# Patient Record
Sex: Female | Born: 1965 | Race: White | Hispanic: No | Marital: Married | Smoking: Never smoker
Health system: Southern US, Community
[De-identification: ages and names within clinical notes are randomized; demographics above are authoritative.]

## PROBLEM LIST (undated history)

## (undated) DIAGNOSIS — K859 Acute pancreatitis without necrosis or infection, unspecified: Secondary | ICD-10-CM

## (undated) DIAGNOSIS — D649 Anemia, unspecified: Secondary | ICD-10-CM

## (undated) DIAGNOSIS — E119 Type 2 diabetes mellitus without complications: Secondary | ICD-10-CM

## (undated) DIAGNOSIS — M51369 Other intervertebral disc degeneration, lumbar region without mention of lumbar back pain or lower extremity pain: Secondary | ICD-10-CM

## (undated) DIAGNOSIS — E039 Hypothyroidism, unspecified: Secondary | ICD-10-CM

## (undated) DIAGNOSIS — E785 Hyperlipidemia, unspecified: Secondary | ICD-10-CM

## (undated) DIAGNOSIS — I10 Essential (primary) hypertension: Secondary | ICD-10-CM

## (undated) DIAGNOSIS — F329 Major depressive disorder, single episode, unspecified: Secondary | ICD-10-CM

## (undated) DIAGNOSIS — F419 Anxiety disorder, unspecified: Secondary | ICD-10-CM

## (undated) DIAGNOSIS — M5136 Other intervertebral disc degeneration, lumbar region: Secondary | ICD-10-CM

## (undated) DIAGNOSIS — F32A Depression, unspecified: Secondary | ICD-10-CM

## (undated) HISTORY — PX: ABDOMINAL HYSTERECTOMY: SHX81

## (undated) HISTORY — PX: CYSTECTOMY: SUR359

---
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
## 2004-05-25 ENCOUNTER — Inpatient Hospital Stay: Payer: Self-pay | Admitting: Obstetrics and Gynecology

## 2007-12-12 ENCOUNTER — Ambulatory Visit: Payer: Self-pay | Admitting: Obstetrics and Gynecology

## 2007-12-26 ENCOUNTER — Ambulatory Visit: Payer: Self-pay | Admitting: Obstetrics and Gynecology

## 2008-06-21 ENCOUNTER — Ambulatory Visit: Payer: Self-pay | Admitting: Obstetrics and Gynecology

## 2009-06-18 ENCOUNTER — Ambulatory Visit: Payer: Self-pay | Admitting: Obstetrics and Gynecology

## 2009-07-25 ENCOUNTER — Ambulatory Visit: Payer: Self-pay | Admitting: Internal Medicine

## 2009-07-25 ENCOUNTER — Emergency Department: Payer: Self-pay | Admitting: Internal Medicine

## 2009-07-28 ENCOUNTER — Ambulatory Visit: Payer: Self-pay | Admitting: Gastroenterology

## 2009-08-26 ENCOUNTER — Ambulatory Visit: Payer: Self-pay | Admitting: Gastroenterology

## 2011-04-28 ENCOUNTER — Ambulatory Visit: Payer: Self-pay | Admitting: Internal Medicine

## 2011-05-13 ENCOUNTER — Ambulatory Visit: Payer: Self-pay | Admitting: Internal Medicine

## 2011-12-17 ENCOUNTER — Ambulatory Visit: Payer: Self-pay | Admitting: Internal Medicine

## 2011-12-27 ENCOUNTER — Ambulatory Visit: Payer: Self-pay | Admitting: Internal Medicine

## 2012-01-17 ENCOUNTER — Ambulatory Visit: Payer: Self-pay | Admitting: Anesthesiology

## 2012-01-17 LAB — BASIC METABOLIC PANEL
Calcium, Total: 9.5 mg/dL (ref 8.5–10.1)
Chloride: 102 mmol/L (ref 98–107)
Co2: 29 mmol/L (ref 21–32)
Creatinine: 0.63 mg/dL (ref 0.60–1.30)
EGFR (African American): >60
Sodium: 139 mmol/L (ref 136–145)

## 2012-01-21 ENCOUNTER — Other Ambulatory Visit: Payer: Self-pay | Admitting: General Surgery

## 2012-01-21 ENCOUNTER — Ambulatory Visit: Payer: Self-pay | Admitting: Surgery

## 2012-01-21 DIAGNOSIS — R922 Inconclusive mammogram: Secondary | ICD-10-CM

## 2012-01-26 ENCOUNTER — Ambulatory Visit: Payer: Self-pay | Admitting: Surgery

## 2012-02-03 ENCOUNTER — Ambulatory Visit
Admission: RE | Admit: 2012-02-03 | Discharge: 2012-02-03 | Disposition: A | Discharge status: Home / Self Care | Payer: BC Managed Care – PPO | Source: Ambulatory Visit | Attending: General Surgery | Admitting: General Surgery

## 2012-02-03 DIAGNOSIS — R922 Inconclusive mammogram: Secondary | ICD-10-CM

## 2012-02-03 MED ORDER — GADOBENATE DIMEGLUMINE 529 MG/ML IV SOLN
15.0000 mL | Freq: Once | INTRAVENOUS | Status: AC | PRN
  Administered 2012-02-03: 15 mL via INTRAVENOUS

## 2012-03-01 ENCOUNTER — Ambulatory Visit: Payer: Self-pay | Admitting: Internal Medicine

## 2014-02-07 ENCOUNTER — Ambulatory Visit: Payer: Self-pay | Admitting: Internal Medicine

## 2014-08-20 NOTE — Op Note (Signed)
PATIENT NAME:  Marissa Cook, Marissa Cook MR#:  [id number] DATE OF BIRTH:  [birth year]  DATE OF PROCEDURE:  01/21/2012  PREOPERATIVE DIAGNOSIS: Internal and external hemorrhoids.   POSTOPERATIVE DIAGNOSIS:  Internal and external hemorrhoids.  PROCEDURE: Internal and external hemorrhoidectomy.   SURGEON:  Rochel Brome, M.D.   ANESTHESIA: General.   INDICATIONS: This 49 year old female has a history of hemorrhoids for some 11 years with increasing bright red bleeding and associated pain. She had findings of large internal and external hemorrhoids found on exam and surgery was recommended for definitive treatment.   DESCRIPTION OF PROCEDURE: The patient was placed on the operating table in the supine position under general anesthesia. The legs were elevated into the lithotomy position. The anal area was prepared with Betadine solution and draped with sterile towels and sheets.   Initial inspection revealed there were large hemorrhoids found at the 4 o'clock, 8 o'clock, and 11 o'clock positions. The anoderm was infiltrated with 0.5% Sensorcaine with epinephrine. The anal canal was dilated large enough to admit three fingers. The bivalve anal retractor was introduced. The internal hemorrhoids were identified at the same locations and then there were other hemorrhoids dispersed between those sites. There was no neoplasm seen.  The hemorrhoid at the 11 o'clock position was removed first. A high ligation of the internal component was done with 2-0 chromic suture ligature. A V-shaped incision was made externally with a scalpel and the dissection was begun with scissors dissecting the external hemorrhoid away from the surrounding subcutaneous tissues. Several small bleeding points were cauterized. Next, dissection was carried up over the internal sphincter up to the previously placed suture ligature. The hemorrhoid was again ligated with the same suture ligature. The wound was inspected. Several small bleeding points  were cauterized. Next, the wound was closed with a running locked 2-0 chromic suture leaving a small opening externally for drainage.   Next, this identical procedure was carried out at the 4 o'clock position and again at the 8 o'clock position. Each of the hemorrhoids was submitted in formalin for routine pathology. After the procedure the anoderm was again cleaned with Betadine and the operative area was infiltrated with 20 mL of  Exparel. Dressings were applied with paper tape. The patient tolerated surgery satisfactorily and was then prepared for transfer to the recovery room.     ____________________________ Lenna Sciara. Rochel Brome, MD [id number] D: 01/21/2012 13:58:03 ET T: 01/21/2012 14:56:59 ET JOB#: [id number]  cc: Loreli Dollar, MD, <Dictator> Loreli Dollar MD ELECTRONICALLY SIGNED 01/28/2012 9:45

## 2015-04-14 ENCOUNTER — Other Ambulatory Visit: Payer: Self-pay | Admitting: Internal Medicine

## 2015-04-14 DIAGNOSIS — R1084 Generalized abdominal pain: Secondary | ICD-10-CM

## 2015-04-14 DIAGNOSIS — R102 Pelvic and perineal pain: Secondary | ICD-10-CM

## 2015-04-21 ENCOUNTER — Ambulatory Visit
Admission: RE | Admit: 2015-04-21 | Discharge: 2015-04-21 | Disposition: A | Discharge status: Home / Self Care | Payer: BC Managed Care – PPO | Source: Ambulatory Visit | Attending: Internal Medicine | Admitting: Internal Medicine

## 2015-04-21 DIAGNOSIS — R102 Pelvic and perineal pain: Secondary | ICD-10-CM

## 2015-04-21 DIAGNOSIS — K760 Fatty (change of) liver, not elsewhere classified: Secondary | ICD-10-CM | POA: Insufficient documentation

## 2015-04-21 DIAGNOSIS — R1084 Generalized abdominal pain: Secondary | ICD-10-CM

## 2015-04-21 DIAGNOSIS — N83202 Unspecified ovarian cyst, left side: Secondary | ICD-10-CM | POA: Diagnosis not present

## 2015-04-21 HISTORY — DX: Type 2 diabetes mellitus without complications: E11.9

## 2015-04-21 MED ORDER — IOHEXOL 300 MG/ML  SOLN
100.0000 mL | Freq: Once | INTRAMUSCULAR | Status: AC | PRN
  Administered 2015-04-21: 100 mL via INTRAVENOUS

## 2015-10-02 ENCOUNTER — Other Ambulatory Visit: Payer: Self-pay | Admitting: Internal Medicine

## 2015-10-02 DIAGNOSIS — R1013 Epigastric pain: Secondary | ICD-10-CM

## 2015-10-08 ENCOUNTER — Ambulatory Visit
Admission: RE | Admit: 2015-10-08 | Discharge: 2015-10-08 | Disposition: A | Discharge status: Home / Self Care | Payer: BC Managed Care – PPO | Source: Ambulatory Visit | Attending: Internal Medicine | Admitting: Internal Medicine

## 2015-10-08 DIAGNOSIS — R1013 Epigastric pain: Secondary | ICD-10-CM | POA: Insufficient documentation

## 2015-10-08 DIAGNOSIS — R932 Abnormal findings on diagnostic imaging of liver and biliary tract: Secondary | ICD-10-CM | POA: Diagnosis not present

## 2015-10-08 DIAGNOSIS — N200 Calculus of kidney: Secondary | ICD-10-CM | POA: Diagnosis not present

## 2015-10-23 ENCOUNTER — Other Ambulatory Visit: Payer: Self-pay | Admitting: Gastroenterology

## 2015-10-23 DIAGNOSIS — R110 Nausea: Secondary | ICD-10-CM

## 2015-10-23 DIAGNOSIS — R1013 Epigastric pain: Secondary | ICD-10-CM

## 2015-11-07 ENCOUNTER — Encounter
Admission: RE | Admit: 2015-11-07 | Discharge: 2015-11-07 | Disposition: A | Discharge status: Home / Self Care | Payer: BC Managed Care – PPO | Source: Ambulatory Visit | Attending: Gastroenterology | Admitting: Gastroenterology

## 2015-11-07 DIAGNOSIS — R1013 Epigastric pain: Secondary | ICD-10-CM | POA: Insufficient documentation

## 2015-11-07 DIAGNOSIS — R110 Nausea: Secondary | ICD-10-CM | POA: Insufficient documentation

## 2015-11-07 MED ORDER — TECHNETIUM TC 99M MEBROFENIN IV KIT
5.0000 | PACK | Freq: Once | INTRAVENOUS | Status: AC | PRN
  Administered 2015-11-07: 5.22 via INTRAVENOUS

## 2015-12-24 ENCOUNTER — Other Ambulatory Visit
Admission: RE | Admit: 2015-12-24 | Discharge: 2015-12-24 | Disposition: A | Discharge status: Home / Self Care | Payer: BC Managed Care – PPO | Source: Other Acute Inpatient Hospital | Attending: Gastroenterology | Admitting: Gastroenterology

## 2015-12-24 DIAGNOSIS — R197 Diarrhea, unspecified: Secondary | ICD-10-CM | POA: Insufficient documentation

## 2015-12-24 LAB — GASTROINTESTINAL PANEL BY PCR, STOOL (REPLACES STOOL CULTURE)
ADENOVIRUS F40/41: NOT DETECTED
ASTROVIRUS: NOT DETECTED
CRYPTOSPORIDIUM: NOT DETECTED
CYCLOSPORA CAYETANENSIS: NOT DETECTED
Campylobacter species: NOT DETECTED
E. coli O157: NOT DETECTED
ENTAMOEBA HISTOLYTICA: NOT DETECTED
ENTEROPATHOGENIC E COLI (EPEC): NOT DETECTED
Enteroaggregative E coli (EAEC): NOT DETECTED
Enterotoxigenic E coli (ETEC): NOT DETECTED
GIARDIA LAMBLIA: NOT DETECTED
Norovirus GI/GII: NOT DETECTED
Plesimonas shigelloides: NOT DETECTED
ROTAVIRUS A: NOT DETECTED
SHIGA LIKE TOXIN PRODUCING E COLI (STEC): NOT DETECTED
Salmonella species: NOT DETECTED
Sapovirus (I, II, IV, and V): NOT DETECTED
Shigella/Enteroinvasive E coli (EIEC): NOT DETECTED
VIBRIO CHOLERAE: NOT DETECTED
VIBRIO SPECIES: NOT DETECTED
YERSINIA ENTEROCOLITICA: NOT DETECTED

## 2015-12-24 LAB — C DIFFICILE QUICK SCREEN W PCR REFLEX
C DIFFICILE (CDIFF) INTERP: NOT DETECTED
C Diff antigen: NEGATIVE
C Diff toxin: NEGATIVE

## 2015-12-26 LAB — H. PYLORI ANTIGEN, STOOL: H. PYLORI STOOL AG, EIA: NEGATIVE

## 2016-01-15 ENCOUNTER — Ambulatory Visit: Payer: BC Managed Care – PPO | Admitting: Anesthesiology

## 2016-01-15 ENCOUNTER — Encounter
Admission: RE | Disposition: A | Discharge status: Home / Self Care | Payer: Self-pay | Source: Ambulatory Visit | Attending: Unknown Physician Specialty

## 2016-01-15 ENCOUNTER — Ambulatory Visit
Admission: RE | Admit: 2016-01-15 | Discharge: 2016-01-15 | Disposition: A | Discharge status: Home / Self Care | Payer: BC Managed Care – PPO | Source: Ambulatory Visit | Attending: Unknown Physician Specialty | Admitting: Unknown Physician Specialty

## 2016-01-15 ENCOUNTER — Encounter: Payer: Self-pay | Admitting: *Deleted

## 2016-01-15 DIAGNOSIS — K317 Polyp of stomach and duodenum: Secondary | ICD-10-CM | POA: Diagnosis not present

## 2016-01-15 DIAGNOSIS — E039 Hypothyroidism, unspecified: Secondary | ICD-10-CM | POA: Diagnosis not present

## 2016-01-15 DIAGNOSIS — K6389 Other specified diseases of intestine: Secondary | ICD-10-CM | POA: Insufficient documentation

## 2016-01-15 DIAGNOSIS — K529 Noninfective gastroenteritis and colitis, unspecified: Secondary | ICD-10-CM | POA: Insufficient documentation

## 2016-01-15 DIAGNOSIS — K5730 Diverticulosis of large intestine without perforation or abscess without bleeding: Secondary | ICD-10-CM | POA: Diagnosis not present

## 2016-01-15 DIAGNOSIS — Z7982 Long term (current) use of aspirin: Secondary | ICD-10-CM | POA: Insufficient documentation

## 2016-01-15 DIAGNOSIS — I10 Essential (primary) hypertension: Secondary | ICD-10-CM | POA: Diagnosis not present

## 2016-01-15 DIAGNOSIS — K2970 Gastritis, unspecified, without bleeding: Secondary | ICD-10-CM | POA: Diagnosis not present

## 2016-01-15 DIAGNOSIS — Z794 Long term (current) use of insulin: Secondary | ICD-10-CM | POA: Diagnosis not present

## 2016-01-15 DIAGNOSIS — D120 Benign neoplasm of cecum: Secondary | ICD-10-CM | POA: Diagnosis not present

## 2016-01-15 DIAGNOSIS — M5136 Other intervertebral disc degeneration, lumbar region: Secondary | ICD-10-CM | POA: Diagnosis not present

## 2016-01-15 DIAGNOSIS — Z90710 Acquired absence of both cervix and uterus: Secondary | ICD-10-CM | POA: Diagnosis not present

## 2016-01-15 DIAGNOSIS — K640 First degree hemorrhoids: Secondary | ICD-10-CM | POA: Insufficient documentation

## 2016-01-15 DIAGNOSIS — Z79899 Other long term (current) drug therapy: Secondary | ICD-10-CM | POA: Insufficient documentation

## 2016-01-15 DIAGNOSIS — E119 Type 2 diabetes mellitus without complications: Secondary | ICD-10-CM | POA: Diagnosis not present

## 2016-01-15 DIAGNOSIS — E785 Hyperlipidemia, unspecified: Secondary | ICD-10-CM | POA: Insufficient documentation

## 2016-01-15 DIAGNOSIS — D122 Benign neoplasm of ascending colon: Secondary | ICD-10-CM | POA: Diagnosis not present

## 2016-01-15 DIAGNOSIS — Z8719 Personal history of other diseases of the digestive system: Secondary | ICD-10-CM | POA: Insufficient documentation

## 2016-01-15 HISTORY — DX: Acute pancreatitis without necrosis or infection, unspecified: K85.90

## 2016-01-15 HISTORY — DX: Hyperlipidemia, unspecified: E78.5

## 2016-01-15 HISTORY — PX: ESOPHAGOGASTRODUODENOSCOPY (EGD) WITH PROPOFOL: SHX5813

## 2016-01-15 HISTORY — DX: Anemia, unspecified: D64.9

## 2016-01-15 HISTORY — DX: Essential (primary) hypertension: I10

## 2016-01-15 HISTORY — DX: Other intervertebral disc degeneration, lumbar region without mention of lumbar back pain or lower extremity pain: M51.369

## 2016-01-15 HISTORY — DX: Hypothyroidism, unspecified: E03.9

## 2016-01-15 HISTORY — PX: COLONOSCOPY: SHX5424

## 2016-01-15 HISTORY — DX: Other intervertebral disc degeneration, lumbar region: M51.36

## 2016-01-15 LAB — GLUCOSE, CAPILLARY
GLUCOSE-CAPILLARY: 249 mg/dL — AB (ref 65–99)
Glucose-Capillary: 293 mg/dL — ABNORMAL HIGH (ref 65–99)

## 2016-01-15 SURGERY — COLONOSCOPY
Anesthesia: General

## 2016-01-15 MED ORDER — FENTANYL CITRATE (PF) 100 MCG/2ML IJ SOLN
INTRAMUSCULAR | Status: DC | PRN
  Administered 2016-01-15: 50 ug via INTRAVENOUS

## 2016-01-15 MED ORDER — PROPOFOL 500 MG/50ML IV EMUL
INTRAVENOUS | Status: DC | PRN
  Administered 2016-01-15: 120 ug/kg/min via INTRAVENOUS

## 2016-01-15 MED ORDER — SODIUM CHLORIDE 0.9 % IV SOLN: INTRAVENOUS | Status: DC

## 2016-01-15 MED ORDER — INSULIN ASPART 100 UNIT/ML ~~LOC~~ SOLN
10.0000 [IU] | Freq: Once | SUBCUTANEOUS | Status: AC
  Administered 2016-01-15: 10 [IU] via SUBCUTANEOUS
  Filled 2016-01-15: qty 0.1

## 2016-01-15 MED ORDER — SODIUM CHLORIDE 0.9 % IV SOLN
INTRAVENOUS | Status: DC
  Administered 2016-01-15: 07:00:00 via INTRAVENOUS

## 2016-01-15 MED ORDER — LIDOCAINE HCL (CARDIAC) 20 MG/ML IV SOLN
INTRAVENOUS | Status: DC | PRN
  Administered 2016-01-15: 50 mg via INTRAVENOUS

## 2016-01-15 MED ORDER — MIDAZOLAM HCL 2 MG/2ML IJ SOLN
INTRAMUSCULAR | Status: DC | PRN
  Administered 2016-01-15: 1 mg via INTRAVENOUS

## 2016-01-15 NOTE — Anesthesia Procedure Notes (Signed)
Performed by: COOK-MARTIN, Carneshia Raker Pre-anesthesia Checklist: Patient identified, Emergency Drugs available, Suction available, Patient being monitored and Timeout performed Patient Re-evaluated:Patient Re-evaluated prior to inductionOxygen Delivery Method: Nasal cannula Preoxygenation: Pre-oxygenation with 100% oxygen Intubation Type: IV induction Airway Equipment and Method: Bite block Placement Confirmation: positive ETCO2 and CO2 detector     

## 2016-01-15 NOTE — H&P (Signed)
Primary Care Physician:  Idelle Crouch, MD Primary Gastroenterologist:  Dr. Vira Agar  Pre-Procedure History & Physical: HPI:  Marissa Cook is a 50 y.o. female is here for an endoscopy and colonoscopy.   Past Medical History:  Diagnosis Date  . Anemia   . DDD (degenerative disc disease), lumbar   . Diabetes mellitus without complication ([place])   . Hyperlipidemia   . Hypertension   . Hypothyroidism   . Pancreatitis     Past Surgical History:  Procedure Laterality Date  . ABDOMINAL HYSTERECTOMY    . CYSTECTOMY      Prior to Admission medications   Medication Sig Start Date End Date Taking? Authorizing Provider  aspirin EC 81 MG tablet Take 81 mg by mouth daily.   Yes Historical Provider, MD  atorvastatin (LIPITOR) 80 MG tablet Take 80 mg by mouth daily.   Yes Historical Provider, MD  dapagliflozin propanediol (FARXIGA) 10 MG TABS tablet Take 10 mg by mouth daily.   Yes Historical Provider, MD  escitalopram (LEXAPRO) 10 MG tablet Take 10 mg by mouth daily.   Yes Historical Provider, MD  gabapentin (NEURONTIN) 300 MG capsule Take 300 mg by mouth 3 (three) times daily.   Yes Historical Provider, MD  hyoscyamine (LEVSIN, ANASPAZ) 0.125 MG tablet Take 0.125 mg by mouth every 6 (six) hours as needed.   Yes Historical Provider, MD  insulin aspart (NOVOLOG) 100 UNIT/ML injection Inject 100 Units into the skin 3 (three) times daily before meals. Use up to 100 units daily in insulin pump   Yes Historical Provider, MD  levothyroxine (SYNTHROID, LEVOTHROID) 150 MCG tablet Take 150 mcg by mouth daily before breakfast.   Yes Historical Provider, MD  metFORMIN (GLUCOPHAGE) 500 MG tablet Take 1,500 mg by mouth daily with supper.   Yes Historical Provider, MD  metoCLOPramide (REGLAN) 5 MG tablet Take 5 mg by mouth 4 (four) times daily.   Yes Historical Provider, MD  pantoprazole (PROTONIX) 40 MG tablet Take 40 mg by mouth daily.   Yes Historical Provider, MD  traZODone (DESYREL) 150 MG  tablet Take 150 mg by mouth at bedtime.   Yes Historical Provider, MD    Allergies as of 12/31/2015  . (No Known Allergies)    History reviewed. No pertinent family history.  Social History   Social History  . Marital status: Married    Spouse name: N/A  . Number of children: N/A  . Years of education: N/A   Occupational History  . Not on file.   Social History Main Topics  . Smoking status: Never Smoker  . Smokeless tobacco: Never Used  . Alcohol use No  . Drug use: No  . Sexual activity: Not on file   Other Topics Concern  . Not on file   Social History Narrative  . No narrative on file    Review of Systems: See HPI, otherwise negative ROS  Physical Exam: BP (!) 141/89   Pulse 77   Temp 97.1 F (36.2 C) (Tympanic)   Resp 12   Ht 5\' 5"  (1.651 m)   Wt 83.9 kg (185 lb)   SpO2 99%   BMI 30.79 kg/m  General:   Alert,  pleasant and cooperative in NAD Head:  Normocephalic and atraumatic. Neck:  Supple; no masses or thyromegaly. Lungs:  Clear throughout to auscultation.    Heart:  Regular rate and rhythm. Abdomen:  Soft, nontender and nondistended. Normal bowel sounds, without guarding, and without rebound.   Neurologic:  Alert  and  oriented x4;  grossly normal neurologically.  Impression/Plan: Marissa Cook is here for an endoscopy and colonoscopy to be performed for diarrhea and epigastric pain  Risks, benefits, limitations, and alternatives regarding  endoscopy and colonoscopy have been reviewed with the patient.  Questions have been answered.  All parties agreeable.   Gaylyn Cheers, MD  01/15/2016, 7:32 AM

## 2016-01-15 NOTE — Transfer of Care (Signed)
Immediate Anesthesia Transfer of Care Note  Patient: Marissa Cook  Procedure(s) Performed: Procedure(s): COLONOSCOPY (N/A) ESOPHAGOGASTRODUODENOSCOPY (EGD) WITH PROPOFOL (N/A)  Patient Location: PACU  Anesthesia Type:General  Level of Consciousness: awake, oriented and sedated  Airway & Oxygen Therapy: Patient Spontanous Breathing and Patient connected to nasal cannula oxygen  Post-op Assessment: Report given to RN and Post -op Vital signs reviewed and stable  Post vital signs: Reviewed and stable  Last Vitals:  Vitals:   01/15/16 0707  BP: (!) 141/89  Pulse: 77  Resp: 12  Temp: 36.2 C    Last Pain:  Vitals:   01/15/16 0707  TempSrc: Tympanic         Complications: No apparent anesthesia complications

## 2016-01-15 NOTE — Op Note (Signed)
[facility] Gastroenterology Patient Name: Marissa Cook Procedure Date: 01/15/2016 7:39 AM MRN: [id number] Account #: [id number] Date of Birth: [birth year] Admit Type: Outpatient Age: 50 Room: [facility] ENDO ROOM 1 Gender: Female Note Status: Finalized Procedure:            Upper GI endoscopy Indications:          Epigastric abdominal pain Providers:            Manya Silvas, MD Referring MD:         Leonie Douglas. Doy Hutching, MD (Referring MD) Medicines:            Propofol per Anesthesia Complications:        No immediate complications. Procedure:            Pre-Anesthesia Assessment:                       - After reviewing the risks and benefits, the patient                        was deemed in satisfactory condition to undergo the                        procedure.                       After obtaining informed consent, the endoscope was                        passed under direct vision. Throughout the procedure,                        the patient's blood pressure, pulse, and oxygen                        saturations were monitored continuously. The Endoscope                        was introduced through the mouth, and advanced to the                        second part of duodenum. The upper GI endoscopy was                        accomplished without difficulty. The patient tolerated                        the procedure well. Findings:      The examined esophagus was normal. GEJ 40cm.      Diffuse and patchy mild inflammation characterized by erythema was found       in the gastric antrum. Biopsies were taken with a cold forceps for       histology. Biopsies were taken with a cold forceps for Helicobacter       pylori testing.      A single small sessile polyp with no bleeding and no stigmata of recent       bleeding was found in the gastric antrum. Biopsies were taken with a       cold forceps for histology.      Diffuse mild mucosal changes characterized by  erythema and granularity       were found  in the gastric body. Biopsies were taken with a cold forceps       for histology.      The second portion of the duodenum was normal. Biopsies were taken with       a cold forceps for histology.      Diffuse minimal granular mucosa was found in the duodenal bulb. Biopsies       were taken with a cold forceps for histology. Biopsies for histology       were taken with a cold forceps for evaluation of celiac disease. Impression:           - Normal esophagus.                       - Gastritis. Biopsied.                       - A single gastric polyp. Biopsied.                       - Erythematous and granular mucosa in the gastric body.                        Biopsied.                       - Normal second portion of the duodenum. Biopsied.                       - Granular mucosa in the duodenal bulb. Biopsied. Recommendation:       - Await pathology results. Manya Silvas, MD 01/15/2016 7:56:37 AM This report has been signed electronically. Number of Addenda: 0 Note Initiated On: 01/15/2016 7:39 AM      [facility]

## 2016-01-15 NOTE — Anesthesia Postprocedure Evaluation (Signed)
Anesthesia Post Note  Patient: Marissa Cook  Procedure(s) Performed: Procedure(s) (LRB): COLONOSCOPY (N/A) ESOPHAGOGASTRODUODENOSCOPY (EGD) WITH PROPOFOL (N/A)  Patient location during evaluation: PACU Anesthesia Type: General Level of consciousness: awake and alert Pain management: pain level controlled Vital Signs Assessment: post-procedure vital signs reviewed and stable Respiratory status: spontaneous breathing and respiratory function stable Cardiovascular status: stable Anesthetic complications: no    Last Vitals:  Vitals:   01/15/16 0707 01/15/16 0820  BP: (!) 141/89 115/75  Pulse: 77 90  Resp: 12 18  Temp: 36.2 C (!) 35.7 C    Last Pain:  Vitals:   01/15/16 0820  TempSrc: Tympanic                 KEPHART,WILLIAM K

## 2016-01-15 NOTE — Anesthesia Preprocedure Evaluation (Signed)
Anesthesia Evaluation  Patient identified by MRN, date of birth, ID band Patient awake    Reviewed: Allergy & Precautions, NPO status , Patient's Chart, lab work & pertinent test results  History of Anesthesia Complications Negative for: history of anesthetic complications  Airway Mallampati: II       Dental   Pulmonary neg pulmonary ROS,           Cardiovascular negative cardio ROS       Neuro/Psych negative neurological ROS     GI/Hepatic negative GI ROS, Neg liver ROS,   Endo/Other  diabetes, Type 2, Oral Hypoglycemic Agents, Insulin DependentHypothyroidism   Renal/GU negative Renal ROS     Musculoskeletal   Abdominal   Peds  Hematology negative hematology ROS (+)   Anesthesia Other Findings   Reproductive/Obstetrics                             Anesthesia Physical Anesthesia Plan  ASA: III  Anesthesia Plan: General   Post-op Pain Management:    Induction: Intravenous  Airway Management Planned: Nasal Cannula  Additional Equipment:   Intra-op Plan:   Post-operative Plan:   Informed Consent: I have reviewed the patients History and Physical, chart, labs and discussed the procedure including the risks, benefits and alternatives for the proposed anesthesia with the patient or authorized representative who has indicated his/her understanding and acceptance.     Plan Discussed with:   Anesthesia Plan Comments:         Anesthesia Quick Evaluation

## 2016-01-15 NOTE — Op Note (Signed)
[facility] Gastroenterology Patient Name: Marissa Cook Procedure Date: 01/15/2016 7:38 AM MRN: [id number] Account #: [id number] Date of Birth: Sep 13, 1965 Admit Type: Outpatient Age: 50 Room: [facility] ENDO ROOM 1 Gender: Female Note Status: Finalized Procedure:            Colonoscopy Indications:          Epigastric abdominal pain, Chronic diarrhea, Clinically                        significant diarrhea of unexplained origin Providers:            Manya Silvas, MD Referring MD:         Leonie Douglas. Doy Hutching, MD (Referring MD) Medicines:            Propofol per Anesthesia Complications:        No immediate complications. Procedure:            Pre-Anesthesia Assessment:                       - After reviewing the risks and benefits, the patient                        was deemed in satisfactory condition to undergo the                        procedure.                       After obtaining informed consent, the colonoscope was                        passed under direct vision. Throughout the procedure,                        the patient's blood pressure, pulse, and oxygen                        saturations were monitored continuously. The                        Colonoscope was introduced through the anus and                        advanced to the the cecum, identified by appendiceal                        orifice and ileocecal valve. The colonoscopy was                        performed without difficulty. The patient tolerated the                        procedure well. The quality of the bowel preparation                        was excellent. Findings:      Two sessile polyps were found in the cecum. The polyps were diminutive       in size. These polyps were removed with a jumbo cold forceps. Resection       and retrieval were complete.      A small  polyp was found in the ascending colon. The polyp was sessile.       The polyp was removed with a hot snare.  Resection and retrieval were       complete.      Many small-mouthed diverticula were found in the sigmoid colon.      Internal hemorrhoids were found during endoscopy. The hemorrhoids were       small and Grade I (internal hemorrhoids that do not prolapse).      Due to diarrhea I biopsied the ascending, transverse, descending, and       sigmoid colon to check for microscopic colitis. Impression:           - Two diminutive polyps in the cecum, removed with a                        jumbo cold forceps. Resected and retrieved.                       - One small polyp in the ascending colon, removed with                        a hot snare. Resected and retrieved.                       - Diverticulosis in the sigmoid colon.                       - Internal hemorrhoids. Recommendation:       - Await pathology results. Manya Silvas, MD 01/15/2016 8:26:15 AM This report has been signed electronically. Number of Addenda: 0 Note Initiated On: 01/15/2016 7:38 AM Scope Withdrawal Time: 0 hours 16 minutes 14 seconds  Total Procedure Duration: 0 hours 22 minutes 36 seconds       [facility]

## 2016-01-16 LAB — SURGICAL PATHOLOGY

## 2018-03-21 ENCOUNTER — Other Ambulatory Visit: Payer: Self-pay | Admitting: Internal Medicine

## 2018-03-21 DIAGNOSIS — R1013 Epigastric pain: Secondary | ICD-10-CM

## 2018-03-28 ENCOUNTER — Ambulatory Visit
Admission: RE | Admit: 2018-03-28 | Discharge: 2018-03-28 | Disposition: A | Discharge status: Home / Self Care | Payer: BC Managed Care – PPO | Source: Ambulatory Visit | Attending: Physician Assistant | Admitting: Physician Assistant

## 2018-03-28 ENCOUNTER — Other Ambulatory Visit: Payer: Self-pay | Admitting: Physician Assistant

## 2018-03-28 DIAGNOSIS — R600 Localized edema: Secondary | ICD-10-CM

## 2018-03-28 DIAGNOSIS — M5442 Lumbago with sciatica, left side: Secondary | ICD-10-CM | POA: Diagnosis not present

## 2019-01-25 ENCOUNTER — Other Ambulatory Visit: Payer: Self-pay | Admitting: Neurosurgery

## 2019-02-13 NOTE — Pre-Procedure Instructions (Signed)
Marissa Cook  02/13/2019      [id number] [place] [name], [name] [address] Phone: [phone] Fax: [phone]    Your procedure is scheduled on 02/21/19.  Report to [facility] Admitting at 630 A.M.  Call this number if you have problems the morning of surgery:  [phone]   Remember:  Do not eat or drink after midnight.   Take these medicines the morning of surgery with A SIP OF WATER ---SYNTHROID    Do not wear jewelry, make-up or nail polish.  Do not wear lotions, powders, or perfumes, or deodorant.  Do not shave 48 hours prior to surgery.  Men may shave face and neck.  Do not bring valuables to the hospital.  [facility] is not responsible for any belongings or valuables.  Contacts, dentures or bridgework may not be worn into surgery.  Leave your suitcase in the car.  After surgery it may be brought to your room.  For patients admitted to the hospital, discharge time will be determined by your treatment team.  Patients discharged the day of surgery will not be allowed to drive home.  Do not take any aspirin,anti-inflammatories,vitamins,or herbal supplements 5-7 days prior to surgery.  Special instructions:    How to Manage Your Diabetes Before and After Surgery  Why is it important to control my blood sugar before and after surgery? . Improving blood sugar levels before and after surgery helps healing and can limit problems. . A way of improving blood sugar control is eating a healthy diet by: o  Eating less sugar and carbohydrates o  Increasing activity/exercise o  Talking with your doctor about reaching your blood sugar goals . High blood sugars (greater than 180 mg/dL) can raise your risk of infections and slow your recovery, so you will need to focus on controlling your diabetes during the weeks before surgery. . Make sure that the doctor who takes care of your diabetes knows about your  planned surgery including the date and location.  How do I manage my blood sugar before surgery? . Check your blood sugar at least 4 times a day, starting 2 days before surgery, to make sure that the level is not too high or low. o Check your blood sugar the morning of your surgery when you wake up and every 2 hours until you get to the Short Stay unit. . If your blood sugar is less than 70 mg/dL, you will need to treat for low blood sugar: o Do not take insulin. o Treat a low blood sugar (less than 70 mg/dL) with  cup of clear juice (cranberry or apple), 4 glucose tablets, OR glucose gel. Recheck blood sugar in 15 minutes after treatment (to make sure it is greater than 70 mg/dL). If your blood sugar is not greater than 70 mg/dL on recheck, call  o  for further instructions. . Report your blood sugar to the short stay nurse when you get to Short Stay.  . If you are admitted to the hospital after surgery: o Your blood sugar will be checked by the staff and you will probably be given insulin after surgery (instead of oral diabetes medicines) to make sure you have good blood sugar levels. o The goal for blood sugar control after surgery is 80-180 mg/dL.              WHAT DO I DO ABOUT MY DIABETES MEDICATION?   Marland Kitchen  Do not take oral diabetes medicines (pills) the morning of surgery.  . THE NIGHT BEFORE SURGERY, take ______17_____ units of _______TRESIBA____insulin.       . THE MORNING OF SURGERY, take _____________ units of __________insulin.  . The day of surgery, do not take other diabetes injectables, including Byetta (exenatide), Bydureon (exenatide ER), Victoza (liraglutide), or Trulicity (dulaglutide).  . If your CBG is greater than 220 mg/dL, you may take  of your sliding scale (correction) dose of insulin.  Other Instructions:          Patient Signature:  Date:   Nurse Signature:  Date:   Reviewed and Endorsed by [facility] Patient Education Committee, August  2015  Please read over the following fact sheets that you were given. MRSA Information [place] - Preparing for Surgery  Before surgery, you can play an important role.  Because skin is not sterile, your skin needs to be as free of germs as possible.  You can reduce the number of germs on you skin by washing with CHG (chlorahexidine gluconate) soap before surgery.  CHG is an antiseptic cleaner which kills germs and bonds with the skin to continue killing germs even after washing.  Oral Hygiene is also important in reducing the risk of infection.  Remember to brush your teeth with your regular toothpaste the morning of surgery.  Please DO NOT use if you have an allergy to CHG or antibacterial soaps.  If your skin becomes reddened/irritated stop using the CHG and inform your nurse when you arrive at Short Stay.  Do not shave (including legs and underarms) for at least 48 hours prior to the first CHG shower.  You may shave your face.  Please follow these instructions carefully:   1.  Shower with CHG Soap the night before surgery and the morning of Surgery.  2.  If you choose to wash your hair, wash your hair first as usual with your normal shampoo.  3.  After you shampoo, rinse your hair and body thoroughly to remove the shampoo. 4.  Use CHG as you would any other liquid soap.  You can apply chg directly to the skin and wash gently with a      scrungie or washcloth.           5.  Apply the CHG Soap to your body ONLY FROM THE NECK DOWN.   Do not use on open wounds or open sores. Avoid contact with your eyes, ears, mouth and genitals (private parts).  Wash genitals (private parts) with your normal soap.  6.  Wash thoroughly, paying special attention to the area where your surgery will be performed.  7.  Thoroughly rinse your body with warm water from the neck down.  8.  DO NOT shower/wash with your normal soap after using and rinsing off the CHG Soap.  9.  Pat yourself dry with a clean towel.             10.  Wear clean pajamas.            11.  Place clean sheets on your bed the night of your first shower and do not sleep with pets.  Day of Surgery  Do not apply any lotions/deoderants the morning of surgery.   Please wear clean clothes to the hospital/surgery center. Remember to brush your teeth with toothpaste.

## 2019-02-14 ENCOUNTER — Encounter (HOSPITAL_COMMUNITY): Payer: Self-pay

## 2019-02-14 ENCOUNTER — Other Ambulatory Visit: Payer: Self-pay

## 2019-02-14 ENCOUNTER — Encounter (HOSPITAL_COMMUNITY)
Admission: RE | Admit: 2019-02-14 | Discharge: 2019-02-14 | Disposition: A | Discharge status: Home / Self Care | Payer: BC Managed Care – PPO | Source: Ambulatory Visit | Attending: Neurosurgery | Admitting: Neurosurgery

## 2019-02-14 DIAGNOSIS — R9431 Abnormal electrocardiogram [ECG] [EKG]: Secondary | ICD-10-CM | POA: Diagnosis not present

## 2019-02-14 DIAGNOSIS — Z90710 Acquired absence of both cervix and uterus: Secondary | ICD-10-CM | POA: Diagnosis not present

## 2019-02-14 DIAGNOSIS — E119 Type 2 diabetes mellitus without complications: Secondary | ICD-10-CM | POA: Insufficient documentation

## 2019-02-14 DIAGNOSIS — Z01818 Encounter for other preprocedural examination: Secondary | ICD-10-CM | POA: Insufficient documentation

## 2019-02-14 DIAGNOSIS — Z79899 Other long term (current) drug therapy: Secondary | ICD-10-CM | POA: Diagnosis not present

## 2019-02-14 DIAGNOSIS — M4316 Spondylolisthesis, lumbar region: Secondary | ICD-10-CM | POA: Insufficient documentation

## 2019-02-14 DIAGNOSIS — Z906 Acquired absence of other parts of urinary tract: Secondary | ICD-10-CM | POA: Diagnosis not present

## 2019-02-14 DIAGNOSIS — I491 Atrial premature depolarization: Secondary | ICD-10-CM | POA: Insufficient documentation

## 2019-02-14 DIAGNOSIS — Z79890 Hormone replacement therapy (postmenopausal): Secondary | ICD-10-CM | POA: Diagnosis not present

## 2019-02-14 DIAGNOSIS — Z794 Long term (current) use of insulin: Secondary | ICD-10-CM | POA: Insufficient documentation

## 2019-02-14 DIAGNOSIS — E039 Hypothyroidism, unspecified: Secondary | ICD-10-CM | POA: Insufficient documentation

## 2019-02-14 HISTORY — DX: Depression, unspecified: F32.A

## 2019-02-14 HISTORY — DX: Anxiety disorder, unspecified: F41.9

## 2019-02-14 LAB — BASIC METABOLIC PANEL
Anion gap: 11 (ref 5–15)
BUN: 14 mg/dL (ref 6–20)
CO2: 22 mmol/L (ref 22–32)
Calcium: 9.5 mg/dL (ref 8.9–10.3)
Chloride: 105 mmol/L (ref 98–111)
Creatinine, Ser: 0.65 mg/dL (ref 0.44–1.00)
GFR calc Af Amer: >60 mL/min (ref >60)
GFR calc non Af Amer: >60 mL/min (ref >60)
Glucose, Bld: 158 mg/dL — ABNORMAL HIGH (ref 70–99)
Potassium: 4 mmol/L (ref 3.5–5.1)
Sodium: 138 mmol/L (ref 135–145)

## 2019-02-14 LAB — CBC
HCT: 44.6 % (ref 36.0–46.0)
Hemoglobin: 15.2 g/dL — ABNORMAL HIGH (ref 12.0–15.0)
MCH: 32.2 pg (ref 26.0–34.0)
MCHC: 34.1 g/dL (ref 30.0–36.0)
MCV: 94.5 fL (ref 80.0–100.0)
Platelets: 308 10*3/uL (ref 150–400)
RBC: 4.72 MIL/uL (ref 3.87–5.11)
RDW: 12.3 % (ref 11.5–15.5)
WBC: 9.4 10*3/uL (ref 4.0–10.5)
nRBC: 0 % (ref 0.0–0.2)

## 2019-02-14 LAB — TYPE AND SCREEN
ABO/RH(D): A POS
Antibody Screen: NEGATIVE

## 2019-02-14 LAB — SURGICAL PCR SCREEN
MRSA, PCR: NEGATIVE
Staphylococcus aureus: NEGATIVE

## 2019-02-14 LAB — ABO/RH: ABO/RH(D): A POS

## 2019-02-14 LAB — GLUCOSE, CAPILLARY: Glucose-Capillary: 160 mg/dL — ABNORMAL HIGH (ref 70–99)

## 2019-02-14 NOTE — Progress Notes (Signed)
PCP - [facility] Cardiologist - [facility]  Endocrinology: Marissa Cook @ [facility]  PPM/ICD - na Device Orders -  Rep Notified -   Chest x-ray - 09/12/18--will request EKG - today Stress Test - 05/30/15--will requist ECHO - na Cardiac Cath - na  Sleep Study - na CPAP -   Fasting Blood Sugar 130-180 Checks Blood Sugar __3-4x___ times a day  Blood Thinner Instructions:na Aspirin Instructions:  ERAS Protcol -na PRE-SURGERY Ensure or G2-   COVID TEST- 02/19/19   Anesthesia review: hbg A1C  Patient denies shortness of breath, fever, cough and chest pain at PAT appointment   All instructions explained to the patient, with a verbal understanding of the material. Patient agrees to go over the instructions while at home for a better understanding. Patient also instructed to self quarantine after being tested for COVID-19. The opportunity to ask questions was provided.

## 2019-02-15 NOTE — Anesthesia Preprocedure Evaluation (Addendum)
Anesthesia Evaluation  Patient identified by MRN, date of birth, ID band Patient awake    Reviewed: Allergy & Precautions, NPO status , Patient's Chart, lab work & pertinent test results  History of Anesthesia Complications Negative for: history of anesthetic complications  Airway Mallampati: II  TM Distance: >3 FB Neck ROM: Full    Dental  (+) Dental Advisory Given, Teeth Intact   Pulmonary neg recent URI,    breath sounds clear to auscultation       Cardiovascular negative cardio ROS   Rhythm:Regular  Neg stress 2017   Neuro/Psych neg Seizures PSYCHIATRIC DISORDERS Anxiety Depression  Neuromuscular disease    GI/Hepatic negative GI ROS, Neg liver ROS,   Endo/Other  diabetesHypothyroidism   Renal/GU negative Renal ROS     Musculoskeletal  (+) Arthritis ,   Abdominal   Peds  Hematology negative hematology ROS (+)   Anesthesia Other Findings   Reproductive/Obstetrics                           Anesthesia Physical Anesthesia Plan  ASA: II  Anesthesia Plan: General   Post-op Pain Management:    Induction: Intravenous  PONV Risk Score and Plan: 3 and Ondansetron and Dexamethasone  Airway Management Planned: Oral ETT  Additional Equipment: None  Intra-op Plan:   Post-operative Plan: Extubation in OR  Informed Consent: I have reviewed the patients History and Physical, chart, labs and discussed the procedure including the risks, benefits and alternatives for the proposed anesthesia with the patient or authorized representative who has indicated his/her understanding and acceptance.     Dental advisory given  Plan Discussed with: CRNA and Surgeon  Anesthesia Plan Comments: (PAT note written 02/15/2019 by Myra Gianotti, PA-C. )       Anesthesia Quick Evaluation

## 2019-02-15 NOTE — Progress Notes (Addendum)
Anesthesia Chart Review:  Case: [id number] Date/Time: 02/21/19 0815   Procedure: ANTERIOR LATERAL LUMBAR INTERBODY FUSION, LATERAL INSTRUMENTATION LUMBAR 4- LUMBAR 5 (N/A ) - ANTERIOR LATERAL LUMBAR INTERBODY FUSION, LATERAL INSTRUMENTATION LUMBAR 4- LUMBAR 5   Anesthesia type: General   Pre-op diagnosis: SPONDYLOLISTHESIS, LUMBAR REGION   Location: [place] OR ROOM 19 / [place] OR   Surgeon: Newman Pies, MD      DISCUSSION: Patient is a 53 year old female scheduled for the above procedure.   History includes never smoker, DM2, hypothyroidism, HLD, pancreatitis (07/2009 after use of Byetta).   A1c 9.9% on 01/03/19 at endocrinology follow-up ([place]), down from 13.0% on 09/12/18. Tyler Aas does increased, Novolog adjusted, and Jardiance added. Reported CBGs 130-180's. PAT glucose 158. I notified Nikki at Dr. Arnoldo Morale' office of 9.9% A1c with medication adjustments at that time.   She was evaluated by cardiologist Dr. Nehemiah Massed in January 2017 for atypical chest pain. His note on 05/30/15 says, "Regular Stress was performed showing Normal test..Marland KitchenNo further cardiac intervention at this time due to normal stress test without evidence of myocardial ischemia or chest pain at peak stress. Patient will watch for any recurrance of concerning symptoms."  Attempting to get copies of 2020 CXR and last stress test (~ 2017) from [facility], but notes in [place] indicate that they were okay. She denied SOB, chest pain, cough, and fever at PAT RN visit. Based on currently available information, I would anticipate that she could proceed as planned if no acute changes.  COVID-19 test is scheduled for 02/19/19.   VS: BP (!) 143/76   Pulse 88   Temp (!) 36.3 C (Oral)   Resp 18   Ht 5\' 3"  (1.6 m)   Wt 76.4 kg   SpO2 100%   BMI 29.84 kg/m   PROVIDERS: Idelle Crouch, MD is PCP Jefm Bryant, see [facility] Everywhere) - Lucilla Lame, MD is endocrinologist Jef[facility] Bryant, see [facility]). Last  visit 11/13/18.  Tyler Aas and levothyroxine increased, NovoLog adjusted, and Jardiance added.  - She is not routinely followed by cardiology, but has intermittently seen Serafina Royals, MD in the past, last 05/30/15 fo evaluation of atypical chest pain (see DISCUSSION).   LABS: Preoperative labs noted. A1c 9.9% last month, but DM medications adjusted. Reported home CBGs ~ 130-180's  . (all labs ordered are listed, but only abnormal results are displayed)  Labs Reviewed  GLUCOSE, CAPILLARY - Abnormal; Notable for the following components:      Result Value   Glucose-Capillary 160 (*)    All other components within normal limits  BASIC METABOLIC PANEL - Abnormal; Notable for the following components:   Glucose, Bld 158 (*)    All other components within normal limits  CBC - Abnormal; Notable for the following components:   Hemoglobin 15.2 (*)    All other components within normal limits  SURGICAL PCR SCREEN  TYPE AND SCREEN  ABO/RH     IMAGES: "CXR negative" on 09/13/2018 per Dr. Doy Hutching' notation in [facility]. Attempting to get copy of report.  UPDATE: 09/12/18 CXR report received. It showed: FINDINGS: The heart size and mediastinal contours are within normal limits.  Both lungs are clear.  The visualized skeletal structures are unremarkable. Impression: No active cardiopulmonary disease.   EKG: 02/14/19: Sinus rhythm with Premature atrial complexes Left axis deviation Abnormal ECG Since last tracing rate slower Confirmed by Quay Burow [id number]) on 02/14/2019 1:32:33 PM   CV: Attempting to get copy of ~  2017 stress test from [facility], but Dr. Alveria Apley note indicate that it was a "Normal test".   Past Medical History:  Diagnosis Date  . Anxiety   . DDD (degenerative disc disease), lumbar   . Depression   . Diabetes mellitus without complication ([place])   . Hyperlipidemia   . Hypothyroidism   . Pancreatitis     Past Surgical History:  Procedure Laterality  Date  . ABDOMINAL HYSTERECTOMY    . COLONOSCOPY N/A 01/15/2016   Procedure: COLONOSCOPY;  Surgeon: Manya Silvas, MD;  Location: [facility] END[facility]COPY;  Service: Endoscopy;  Laterality: N/A;  . CYSTECTOMY    . ESOPHAGOGASTRODUODENOSCOPY (EGD) WITH PROPOFOL N/A 01/15/2016   Procedure: ESOPHAGOGASTRODUODENOSCOPY (EGD) WITH PROPOFOL;  Surgeon: Manya Silvas, MD;  Location[facility] ENDOSCOPY;  Service: Endoscopy;  Laterality: N/A;    MEDICATIONS: . gabapentin (NEURONTIN) 300 MG capsule  . insulin aspart (NOVOLOG) 100 UNIT/ML injection  . insulin degludec (TRESIBA FLEXTOUCH) 100 UNIT/ML SOPN FlexTouch Pen  . levothyroxine (SYNTHROID) 300 MCG tablet  . metFORMIN (GLUCOPHAGE-XR) 500 MG 24 hr tablet   No current facility-administered medications for this encounter.     Myra Gianotti, PA-C Surgical Short Stay/Anesthesiology [facility] Phone [phone] [facility] Phone [phone] 02/15/2019 1:43 PM

## 2019-02-16 ENCOUNTER — Other Ambulatory Visit: Payer: Self-pay | Admitting: Neurosurgery

## 2019-02-19 ENCOUNTER — Other Ambulatory Visit: Payer: Self-pay

## 2019-02-19 ENCOUNTER — Other Ambulatory Visit
Admission: RE | Admit: 2019-02-19 | Discharge: 2019-02-19 | Disposition: A | Discharge status: Home / Self Care | Payer: BC Managed Care – PPO | Source: Ambulatory Visit | Attending: Neurosurgery | Admitting: Neurosurgery

## 2019-02-19 DIAGNOSIS — Z01812 Encounter for preprocedural laboratory examination: Secondary | ICD-10-CM | POA: Insufficient documentation

## 2019-02-19 DIAGNOSIS — Z20828 Contact with and (suspected) exposure to other viral communicable diseases: Secondary | ICD-10-CM | POA: Insufficient documentation

## 2019-02-19 LAB — SARS CORONAVIRUS 2 (TAT 6-24 HRS): SARS Coronavirus 2: NEGATIVE

## 2019-02-21 ENCOUNTER — Inpatient Hospital Stay (HOSPITAL_COMMUNITY): Payer: BC Managed Care – PPO

## 2019-02-21 ENCOUNTER — Inpatient Hospital Stay (HOSPITAL_COMMUNITY): Payer: BC Managed Care – PPO | Admitting: Vascular Surgery

## 2019-02-21 ENCOUNTER — Inpatient Hospital Stay (HOSPITAL_COMMUNITY)
Admission: RE | Admit: 2019-02-21 | Discharge: 2019-02-22 | DRG: 460 | Disposition: A | Discharge status: Home / Self Care | Payer: BC Managed Care – PPO | Attending: Neurosurgery | Admitting: Neurosurgery

## 2019-02-21 ENCOUNTER — Other Ambulatory Visit: Payer: Self-pay

## 2019-02-21 ENCOUNTER — Encounter (HOSPITAL_COMMUNITY): Payer: Self-pay

## 2019-02-21 ENCOUNTER — Encounter (HOSPITAL_COMMUNITY)
Admission: RE | Disposition: A | Discharge status: Home / Self Care | Payer: Self-pay | Source: Home / Self Care | Attending: Neurosurgery

## 2019-02-21 ENCOUNTER — Inpatient Hospital Stay (HOSPITAL_COMMUNITY): Payer: BC Managed Care – PPO | Admitting: Certified Registered Nurse Anesthetist

## 2019-02-21 DIAGNOSIS — E039 Hypothyroidism, unspecified: Secondary | ICD-10-CM | POA: Diagnosis present

## 2019-02-21 DIAGNOSIS — Z419 Encounter for procedure for purposes other than remedying health state, unspecified: Secondary | ICD-10-CM

## 2019-02-21 DIAGNOSIS — E785 Hyperlipidemia, unspecified: Secondary | ICD-10-CM | POA: Diagnosis present

## 2019-02-21 DIAGNOSIS — F419 Anxiety disorder, unspecified: Secondary | ICD-10-CM | POA: Diagnosis present

## 2019-02-21 DIAGNOSIS — Z7984 Long term (current) use of oral hypoglycemic drugs: Secondary | ICD-10-CM

## 2019-02-21 DIAGNOSIS — E119 Type 2 diabetes mellitus without complications: Secondary | ICD-10-CM | POA: Diagnosis present

## 2019-02-21 DIAGNOSIS — Z79899 Other long term (current) drug therapy: Secondary | ICD-10-CM

## 2019-02-21 DIAGNOSIS — M4316 Spondylolisthesis, lumbar region: Principal | ICD-10-CM | POA: Diagnosis present

## 2019-02-21 DIAGNOSIS — M5116 Intervertebral disc disorders with radiculopathy, lumbar region: Secondary | ICD-10-CM | POA: Diagnosis present

## 2019-02-21 DIAGNOSIS — Z79890 Hormone replacement therapy (postmenopausal): Secondary | ICD-10-CM

## 2019-02-21 HISTORY — PX: ANTERIOR LAT LUMBAR FUSION: SHX1168

## 2019-02-21 LAB — GLUCOSE, CAPILLARY
Glucose-Capillary: 145 mg/dL — ABNORMAL HIGH (ref 70–99)
Glucose-Capillary: 163 mg/dL — ABNORMAL HIGH (ref 70–99)
Glucose-Capillary: 362 mg/dL — ABNORMAL HIGH (ref 70–99)
Glucose-Capillary: 299 mg/dL — ABNORMAL HIGH (ref 70–99)

## 2019-02-21 LAB — HEMOGLOBIN A1C
Hgb A1c MFr Bld: 8.3 % — ABNORMAL HIGH (ref 4.8–5.6)
Mean Plasma Glucose: 191.51 mg/dL

## 2019-02-21 SURGERY — ANTERIOR LATERAL LUMBAR FUSION 1 LEVEL
Anesthesia: General | Site: Spine Lumbar

## 2019-02-21 MED ORDER — FENTANYL CITRATE (PF) 250 MCG/5ML IJ SOLN
INTRAMUSCULAR | Status: AC
  Filled 2019-02-21: qty 5

## 2019-02-21 MED ORDER — THROMBIN 5000 UNITS EX SOLR
OROMUCOSAL | Status: DC | PRN
  Administered 2019-02-21: 5 mL via TOPICAL

## 2019-02-21 MED ORDER — SUCCINYLCHOLINE CHLORIDE 200 MG/10ML IV SOSY
PREFILLED_SYRINGE | INTRAVENOUS | Status: DC | PRN
  Administered 2019-02-21: 100 mg via INTRAVENOUS

## 2019-02-21 MED ORDER — OXYCODONE HCL 5 MG PO TABS: 5.0000 mg | ORAL_TABLET | ORAL | Status: DC | PRN

## 2019-02-21 MED ORDER — CEFAZOLIN SODIUM-DEXTROSE 2-4 GM/100ML-% IV SOLN
2.0000 g | Freq: Three times a day (TID) | INTRAVENOUS | Status: AC
  Administered 2019-02-21 (×2): 2 g via INTRAVENOUS
  Filled 2019-02-21 (×2): qty 100

## 2019-02-21 MED ORDER — PHENYLEPHRINE 40 MCG/ML (10ML) SYRINGE FOR IV PUSH (FOR BLOOD PRESSURE SUPPORT)
PREFILLED_SYRINGE | INTRAVENOUS | Status: AC
  Filled 2019-02-21: qty 10

## 2019-02-21 MED ORDER — SODIUM CHLORIDE 0.9 % IV SOLN
INTRAVENOUS | Status: DC | PRN
  Administered 2019-02-21: 500 mL

## 2019-02-21 MED ORDER — ACETAMINOPHEN 10 MG/ML IV SOLN: 1000.0000 mg | Freq: Once | INTRAVENOUS | Status: DC | PRN

## 2019-02-21 MED ORDER — CYCLOBENZAPRINE HCL 10 MG PO TABS
10.0000 mg | ORAL_TABLET | Freq: Three times a day (TID) | ORAL | Status: DC | PRN
  Administered 2019-02-21 – 2019-02-22 (×2): 10 mg via ORAL
  Filled 2019-02-21 (×2): qty 1

## 2019-02-21 MED ORDER — SODIUM CHLORIDE 0.9% FLUSH: 3.0000 mL | INTRAVENOUS | Status: DC | PRN

## 2019-02-21 MED ORDER — LACTATED RINGERS IV SOLN
INTRAVENOUS | Status: DC | PRN
  Administered 2019-02-21: 08:00:00 via INTRAVENOUS

## 2019-02-21 MED ORDER — MIDAZOLAM HCL 2 MG/2ML IJ SOLN
INTRAMUSCULAR | Status: AC
  Filled 2019-02-21: qty 2

## 2019-02-21 MED ORDER — PROPOFOL 10 MG/ML IV BOLUS
INTRAVENOUS | Status: AC
  Filled 2019-02-21: qty 20

## 2019-02-21 MED ORDER — SODIUM CHLORIDE 0.9% FLUSH
3.0000 mL | Freq: Two times a day (BID) | INTRAVENOUS | Status: DC
  Administered 2019-02-21 (×2): 3 mL via INTRAVENOUS

## 2019-02-21 MED ORDER — DOCUSATE SODIUM 100 MG PO CAPS
100.0000 mg | ORAL_CAPSULE | Freq: Two times a day (BID) | ORAL | Status: DC
  Administered 2019-02-21 – 2019-02-22 (×2): 100 mg via ORAL
  Filled 2019-02-21 (×2): qty 1

## 2019-02-21 MED ORDER — PROPOFOL 500 MG/50ML IV EMUL
INTRAVENOUS | Status: DC | PRN
  Administered 2019-02-21: 75 ug/kg/min via INTRAVENOUS

## 2019-02-21 MED ORDER — ACETAMINOPHEN 160 MG/5ML PO SOLN: 1000.0000 mg | Freq: Once | ORAL | Status: DC | PRN

## 2019-02-21 MED ORDER — ACETAMINOPHEN 325 MG PO TABS
650.0000 mg | ORAL_TABLET | ORAL | Status: DC | PRN
  Administered 2019-02-22: 650 mg via ORAL
  Filled 2019-02-21: qty 2

## 2019-02-21 MED ORDER — OXYCODONE HCL 5 MG PO TABS
ORAL_TABLET | ORAL | Status: AC
  Filled 2019-02-21: qty 1

## 2019-02-21 MED ORDER — HYDROXYZINE HCL 50 MG/ML IM SOLN: 50.0000 mg | Freq: Four times a day (QID) | INTRAMUSCULAR | Status: DC | PRN

## 2019-02-21 MED ORDER — DEXAMETHASONE SODIUM PHOSPHATE 10 MG/ML IJ SOLN
INTRAMUSCULAR | Status: DC | PRN
  Administered 2019-02-21: 4 mg via INTRAVENOUS

## 2019-02-21 MED ORDER — CHLORHEXIDINE GLUCONATE CLOTH 2 % EX PADS: 6.0000 | MEDICATED_PAD | Freq: Once | CUTANEOUS | Status: DC

## 2019-02-21 MED ORDER — FENTANYL CITRATE (PF) 100 MCG/2ML IJ SOLN
INTRAMUSCULAR | Status: AC
  Filled 2019-02-21: qty 2

## 2019-02-21 MED ORDER — BACITRACIN ZINC 500 UNIT/GM EX OINT
TOPICAL_OINTMENT | CUTANEOUS | Status: DC | PRN
  Administered 2019-02-21: 1 via TOPICAL

## 2019-02-21 MED ORDER — ACETAMINOPHEN 500 MG PO TABS: 1000.0000 mg | ORAL_TABLET | Freq: Once | ORAL | Status: DC | PRN

## 2019-02-21 MED ORDER — LIDOCAINE 2% (20 MG/ML) 5 ML SYRINGE
INTRAMUSCULAR | Status: DC | PRN
  Administered 2019-02-21: 60 mg via INTRAVENOUS

## 2019-02-21 MED ORDER — CEFAZOLIN SODIUM-DEXTROSE 2-4 GM/100ML-% IV SOLN
INTRAVENOUS | Status: AC
  Filled 2019-02-21: qty 100

## 2019-02-21 MED ORDER — DEXAMETHASONE SODIUM PHOSPHATE 10 MG/ML IJ SOLN
INTRAMUSCULAR | Status: AC
  Filled 2019-02-21: qty 1

## 2019-02-21 MED ORDER — FENTANYL CITRATE (PF) 100 MCG/2ML IJ SOLN: 25.0000 ug | INTRAMUSCULAR | Status: DC | PRN

## 2019-02-21 MED ORDER — ONDANSETRON HCL 4 MG/2ML IJ SOLN
INTRAMUSCULAR | Status: DC | PRN
  Administered 2019-02-21: 4 mg via INTRAVENOUS

## 2019-02-21 MED ORDER — OXYCODONE HCL 5 MG PO TABS
10.0000 mg | ORAL_TABLET | ORAL | Status: DC | PRN
  Administered 2019-02-21 – 2019-02-22 (×4): 10 mg via ORAL
  Filled 2019-02-21 (×5): qty 2

## 2019-02-21 MED ORDER — LACTATED RINGERS IV SOLN
INTRAVENOUS | Status: DC | PRN
  Administered 2019-02-21 (×2): via INTRAVENOUS

## 2019-02-21 MED ORDER — 0.9 % SODIUM CHLORIDE (POUR BTL) OPTIME
TOPICAL | Status: DC | PRN
  Administered 2019-02-21: 1000 mL

## 2019-02-21 MED ORDER — OXYCODONE HCL 5 MG/5ML PO SOLN: 5.0000 mg | Freq: Once | ORAL | Status: AC | PRN

## 2019-02-21 MED ORDER — OXYCODONE HCL 5 MG PO TABS
5.0000 mg | ORAL_TABLET | Freq: Once | ORAL | Status: AC | PRN
  Administered 2019-02-21: 5 mg via ORAL

## 2019-02-21 MED ORDER — PROPOFOL 10 MG/ML IV BOLUS
INTRAVENOUS | Status: DC | PRN
  Administered 2019-02-21: 130 mg via INTRAVENOUS

## 2019-02-21 MED ORDER — MORPHINE SULFATE (PF) 4 MG/ML IV SOLN: 4.0000 mg | INTRAVENOUS | Status: DC | PRN

## 2019-02-21 MED ORDER — ONDANSETRON HCL 4 MG/2ML IJ SOLN
INTRAMUSCULAR | Status: AC
  Filled 2019-02-21: qty 2

## 2019-02-21 MED ORDER — MENTHOL 3 MG MT LOZG: 1.0000 | LOZENGE | OROMUCOSAL | Status: DC | PRN

## 2019-02-21 MED ORDER — ZOLPIDEM TARTRATE 5 MG PO TABS
5.0000 mg | ORAL_TABLET | Freq: Every evening | ORAL | Status: DC | PRN
  Administered 2019-02-21: 5 mg via ORAL
  Filled 2019-02-21: qty 1

## 2019-02-21 MED ORDER — THROMBIN 5000 UNITS EX SOLR
CUTANEOUS | Status: AC
  Filled 2019-02-21: qty 5000

## 2019-02-21 MED ORDER — BISACODYL 10 MG RE SUPP: 10.0000 mg | Freq: Every day | RECTAL | Status: DC | PRN

## 2019-02-21 MED ORDER — PHENOL 1.4 % MT LIQD: 1.0000 | OROMUCOSAL | Status: DC | PRN

## 2019-02-21 MED ORDER — SODIUM CHLORIDE 0.9 % IV SOLN
INTRAVENOUS | Status: DC | PRN
  Administered 2019-02-21: 50 ug/min via INTRAVENOUS

## 2019-02-21 MED ORDER — CEFAZOLIN SODIUM-DEXTROSE 2-4 GM/100ML-% IV SOLN
2.0000 g | INTRAVENOUS | Status: AC
  Administered 2019-02-21: 2 g via INTRAVENOUS

## 2019-02-21 MED ORDER — BUPIVACAINE-EPINEPHRINE (PF) 0.5% -1:200000 IJ SOLN
INTRAMUSCULAR | Status: AC
  Filled 2019-02-21: qty 30

## 2019-02-21 MED ORDER — ACETAMINOPHEN 650 MG RE SUPP: 650.0000 mg | RECTAL | Status: DC | PRN

## 2019-02-21 MED ORDER — INSULIN ASPART 100 UNIT/ML ~~LOC~~ SOLN
0.0000 [IU] | Freq: Three times a day (TID) | SUBCUTANEOUS | Status: DC
  Administered 2019-02-21: 20 [IU] via SUBCUTANEOUS
  Administered 2019-02-22: 08:00:00 7 [IU] via SUBCUTANEOUS

## 2019-02-21 MED ORDER — ONDANSETRON HCL 4 MG PO TABS: 4.0000 mg | ORAL_TABLET | Freq: Four times a day (QID) | ORAL | Status: DC | PRN

## 2019-02-21 MED ORDER — MIDAZOLAM HCL 5 MG/5ML IJ SOLN
INTRAMUSCULAR | Status: DC | PRN
  Administered 2019-02-21: 2 mg via INTRAVENOUS

## 2019-02-21 MED ORDER — FENTANYL CITRATE (PF) 100 MCG/2ML IJ SOLN
INTRAMUSCULAR | Status: DC | PRN
  Administered 2019-02-21: 100 ug via INTRAVENOUS
  Administered 2019-02-21 (×2): 50 ug via INTRAVENOUS
  Administered 2019-02-21: 100 ug via INTRAVENOUS

## 2019-02-21 MED ORDER — ACETAMINOPHEN 500 MG PO TABS
1000.0000 mg | ORAL_TABLET | Freq: Four times a day (QID) | ORAL | Status: AC
  Administered 2019-02-21 – 2019-02-22 (×3): 1000 mg via ORAL
  Filled 2019-02-21 (×3): qty 2

## 2019-02-21 MED ORDER — SODIUM CHLORIDE 0.9 % IV SOLN
250.0000 mL | INTRAVENOUS | Status: DC
  Administered 2019-02-21: 16:00:00 250 mL via INTRAVENOUS

## 2019-02-21 MED ORDER — METFORMIN HCL ER 500 MG PO TB24
500.0000 mg | ORAL_TABLET | Freq: Two times a day (BID) | ORAL | Status: DC
  Administered 2019-02-21 – 2019-02-22 (×2): 500 mg via ORAL
  Filled 2019-02-21 (×2): qty 1

## 2019-02-21 MED ORDER — ONDANSETRON HCL 4 MG/2ML IJ SOLN
4.0000 mg | Freq: Four times a day (QID) | INTRAMUSCULAR | Status: DC | PRN
  Administered 2019-02-21: 4 mg via INTRAVENOUS

## 2019-02-21 MED ORDER — GABAPENTIN 300 MG PO CAPS
600.0000 mg | ORAL_CAPSULE | Freq: Every day | ORAL | Status: DC
  Administered 2019-02-21: 600 mg via ORAL
  Filled 2019-02-21: qty 2

## 2019-02-21 MED ORDER — BUPIVACAINE-EPINEPHRINE 0.5% -1:200000 IJ SOLN
INTRAMUSCULAR | Status: DC | PRN
  Administered 2019-02-21: 10 mL

## 2019-02-21 MED ORDER — BACITRACIN ZINC 500 UNIT/GM EX OINT
TOPICAL_OINTMENT | CUTANEOUS | Status: AC
  Filled 2019-02-21: qty 28.35

## 2019-02-21 MED ORDER — LEVOTHYROXINE SODIUM 100 MCG PO TABS
300.0000 ug | ORAL_TABLET | Freq: Every day | ORAL | Status: DC
  Administered 2019-02-22: 300 ug via ORAL
  Filled 2019-02-21: qty 3

## 2019-02-21 SURGICAL SUPPLY — 56 items
BENZOIN TINCTURE PRP APPL 2/3 (GAUZE/BANDAGES/DRESSINGS) ×2 IMPLANT
BLADE CLIPPER SURG (BLADE) IMPLANT
BUR PRECISION FLUTE 6.0 (BURR) ×1 IMPLANT
CAGE MODULUS XL 8X18X50 - 10 (Cage) ×2 IMPLANT
CARTRIDGE OIL MAESTRO DRILL (MISCELLANEOUS) ×1 IMPLANT
CLOSURE WOUND 1/2 X4 (GAUZE/BANDAGES/DRESSINGS) ×1
COVER WAND RF STERILE (DRAPES) ×1 IMPLANT
DERMABOND ADVANCED (GAUZE/BANDAGES/DRESSINGS)
DERMABOND ADVANCED .7 DNX12 (GAUZE/BANDAGES/DRESSINGS) IMPLANT
DIFFUSER DRILL AIR PNEUMATIC (MISCELLANEOUS) ×1 IMPLANT
DRAPE C-ARM 42X72 X-RAY (DRAPES) ×3 IMPLANT
DRAPE C-ARMOR (DRAPES) ×3 IMPLANT
DRAPE LAPAROTOMY 100X72X124 (DRAPES) ×3 IMPLANT
DRSG OPSITE 4X5.5 SM (GAUZE/BANDAGES/DRESSINGS) ×2 IMPLANT
DRSG OPSITE POSTOP 4X6 (GAUZE/BANDAGES/DRESSINGS) ×2 IMPLANT
DURAPREP 26ML APPLICATOR (WOUND CARE) ×1 IMPLANT
ELECT REM PT RETURN 9FT ADLT (ELECTROSURGICAL) ×3
ELECTRODE REM PT RTRN 9FT ADLT (ELECTROSURGICAL) ×1 IMPLANT
GAUZE 4X4 16PLY RFD (DISPOSABLE) IMPLANT
GLOVE BIO SURGEON STRL SZ 6.5 (GLOVE) ×1 IMPLANT
GLOVE BIO SURGEON STRL SZ8 (GLOVE) ×3 IMPLANT
GLOVE BIO SURGEON STRL SZ8.5 (GLOVE) ×3 IMPLANT
GLOVE BIO SURGEONS STRL SZ 6.5 (GLOVE) ×1
GLOVE BIOGEL PI IND STRL 6.5 (GLOVE) IMPLANT
GLOVE BIOGEL PI IND STRL 7.0 (GLOVE) IMPLANT
GLOVE BIOGEL PI INDICATOR 6.5 (GLOVE) ×2
GLOVE BIOGEL PI INDICATOR 7.0 (GLOVE) ×2
GLOVE EXAM NITRILE XL STR (GLOVE) IMPLANT
GOWN STRL REUS W/ TWL LRG LVL3 (GOWN DISPOSABLE) IMPLANT
GOWN STRL REUS W/ TWL XL LVL3 (GOWN DISPOSABLE) IMPLANT
GOWN STRL REUS W/TWL LRG LVL3 (GOWN DISPOSABLE) ×2
GOWN STRL REUS W/TWL XL LVL3 (GOWN DISPOSABLE) ×2
KIT BASIN OR (CUSTOM PROCEDURE TRAY) ×3 IMPLANT
KIT DILATOR XLIF 5 (KITS) ×1 IMPLANT
KIT SURGICAL ACCESS MAXCESS 4 (KITS) ×2 IMPLANT
KIT TURNOVER KIT B (KITS) ×3 IMPLANT
KIT XLIF (KITS) ×1
MODULE NVM5 NEXT GEN EMG (NEEDLE) ×2 IMPLANT
NDL HYPO 25X1 1.5 SAFETY (NEEDLE) ×1 IMPLANT
NEEDLE HYPO 25X1 1.5 SAFETY (NEEDLE) ×3 IMPLANT
NS IRRIG 1000ML POUR BTL (IV SOLUTION) ×3 IMPLANT
OIL CARTRIDGE MAESTRO DRILL (MISCELLANEOUS)
PACK LAMINECTOMY NEURO (CUSTOM PROCEDURE TRAY) ×3 IMPLANT
PLATE 2H DECADE 8MM (Plate) ×2 IMPLANT
PUTTY DBM 5CC CALC GRAN ×2 IMPLANT
SCREW DECADE 5.5X50 (Screw) ×4 IMPLANT
SPONGE LAP 4X18 RFD (DISPOSABLE) IMPLANT
STRIP CLOSURE SKIN 1/2X4 (GAUZE/BANDAGES/DRESSINGS) ×1 IMPLANT
SUT VIC AB 1 CT1 18XBRD ANBCTR (SUTURE) ×1 IMPLANT
SUT VIC AB 1 CT1 8-18 (SUTURE) ×2
SUT VIC AB 2-0 CP2 18 (SUTURE) ×3 IMPLANT
SUT VIC AB 3-0 SH 8-18 (SUTURE) ×1 IMPLANT
TOWEL GREEN STERILE (TOWEL DISPOSABLE) ×3 IMPLANT
TOWEL GREEN STERILE FF (TOWEL DISPOSABLE) ×3 IMPLANT
TRAY FOLEY MTR SLVR 16FR STAT (SET/KITS/TRAYS/PACK) ×3 IMPLANT
WATER STERILE IRR 1000ML POUR (IV SOLUTION) ×3 IMPLANT

## 2019-02-21 NOTE — Progress Notes (Signed)
Orthopedic Tech Progress Note Patient Details:  Marissa Cook 24-Dec-1965 [id number] RN said patient has brace Patient ID: Marissa Cook, female   DOB: Mar 20, 1966, 53 y.o.   MRN: [id number]   Marissa Cook 02/21/2019, 2:09 PM

## 2019-02-21 NOTE — H&P (Signed)
Subjective: The patient is a 53 year old white female who has complained of back and left leg pain.  She has failed medical management and was worked up with lumbar x-rays and lumbar MRI which demonstrated an L4-5 spondylolisthesis.  I discussed the various treatment options with her.  She has decided proceed with surgery.  Past Medical History:  Diagnosis Date  . Anxiety   . DDD (degenerative disc disease), lumbar   . Depression   . Diabetes mellitus without complication ([place])   . Hyperlipidemia   . Hypothyroidism   . Pancreatitis     Past Surgical History:  Procedure Laterality Date  . ABDOMINAL HYSTERECTOMY    . COLONOSCOPY N/A 01/15/2016   Procedure: COLONOSCOPY;  Surgeon: Manya Silvas, MD;  Location[facility] ENDOSCOPY;  Service: Endoscopy;  Laterality: N/A;  . CYSTECTOMY    . ESOPHAGOGASTRODUODENOSCOPY (EGD) WITH PROPOFOL N/A 01/15/2016   Procedure: ESOPHAGOGASTRODUODENOSCOPY (EGD) WITH PROPOFOL;  Surgeon: Manya Silvas, MD;  Location: [facility] ENDOSCOPY;  Service: Endoscopy;  Laterality: N/A;    Allergies  Allergen Reactions  . Sulfasalazine     UNSPECIFIED REACTION     Social History   Tobacco Use  . Smoking status: Never Smoker  . Smokeless tobacco: Never Used  Substance Use Topics  . Alcohol use: No    History reviewed. No pertinent family history. Prior to Admission medications   Medication Sig Start Date End Date Taking? Authorizing Provider  gabapentin (NEURONTIN) 300 MG capsule Take 600 mg by mouth at bedtime.    Yes [provider]  insulin aspart (NOVOLOG) 100 UNIT/ML injection Inject 10-20 Units into the skin 3 (three) times daily before meals.    Yes [provider]  insulin degludec (TRESIBA FLEXTOUCH) 100 UNIT/ML SOPN FlexTouch Pen Inject 35 Units into the skin at bedtime.   Yes [provider]  levothyroxine (SYNTHROID) 300 MCG tablet Take 300 mcg by mouth daily before breakfast.    Yes [provider]  metFORMIN  (GLUCOPHAGE-XR) 500 MG 24 hr tablet Take 500 mg by mouth 2 (two) times daily.   Yes [provider]     Review of Systems  Positive ROS: As above  All other systems have been reviewed and were otherwise negative with the exception of those mentioned in the HPI and as above.  Objective: Vital signs in last 24 hours: Temp:  [98.6 F (37 C)] 98.6 F (37 C) (10/21 0659) Pulse Rate:  [94] 94 (10/21 0659) Resp:  [16] 16 (10/21 0659) BP: (149)/(92) 149/92 (10/21 0659) SpO2:  [99 %] 99 % (10/21 0659) Estimated body mass index is 29.84 kg/m as calculated from the following:   Height as of 02/14/19: 5\' 3"  (1.6 m).   Weight as of 02/14/19: 76.4 kg.   General Appearance: Alert Head: Normocephalic, without obvious abnormality, atraumatic Eyes: PERRL, conjunctiva/corneas clear, EOM's intact,    Ears: Normal  Throat: Normal  Neck: Supple, Back: unremarkable Lungs: Clear to auscultation bilaterally, respirations unlabored Heart: Regular rate and rhythm, no murmur, rub or gallop Abdomen: Soft, non-tender Extremities: Extremities normal, atraumatic, no cyanosis or edema Skin: unremarkable  NEUROLOGIC:   Mental status: alert and oriented,Motor Exam - grossly normal Sensory Exam - grossly normal Reflexes:  Coordination - grossly normal Gait - grossly normal Balance - grossly normal Cranial Nerves: I: smell Not tested  II: visual acuity  OS: Normal  OD: Normal   II: visual fields Full to confrontation  II: pupils Equal, round, reactive to light  III,VII: ptosis None  III,IV,VI: extraocular muscles  Full ROM  V: mastication Normal  V: facial light touch sensation  Normal  V,VII: corneal reflex  Present  VII: facial muscle function - upper  Normal  VII: facial muscle function - lower Normal  VIII: hearing Not tested  IX: soft palate elevation  Normal  IX,X: gag reflex Present  XI: trapezius strength  5/5  XI: sternocleidomastoid strength 5/5  XI: neck flexion strength   5/5  XII: tongue strength  Normal    Data Review Lab Results  Component Value Date   WBC 9.4 02/14/2019   HGB 15.2 (H) 02/14/2019   HCT 44.6 02/14/2019   MCV 94.5 02/14/2019   PLT 308 02/14/2019   Lab Results  Component Value Date   NA 138 02/14/2019   K 4.0 02/14/2019   CL 105 02/14/2019   CO2 22 02/14/2019   BUN 14 02/14/2019   CREATININE 0.65 02/14/2019   GLUCOSE 158 (H) 02/14/2019   No results found for: INR, PROTIME  Assessment/Plan: L4-5 spondylolisthesis, degenerative disc disease, lumbago, lumbar radiculopathy: I have discussed the situation with the patient.  I have reviewed her imaging studies with her and pointed out the abnormalities.  We have discussed the various treatment options including surgery.  I have described the surgical treatment option of a left L4-5 anterior lateral interbody fusion with interbody prosthesis and lateral instrumentation.  I have described the surgery to her.  I have shown her surgical models.  I have given her a surgical pamphlet.  We have discussed the risks, benefits, alternatives, expected postoperative course, and likelihood of achieving our goals with surgery.  I have answered all her questions.  She has decided to proceed with surgery.  Marissa Cook 02/21/2019 7:50 AM

## 2019-02-21 NOTE — Progress Notes (Signed)
Subjective: The patient is somnolent but arousable.  She is in no apparent distress.  She looks well.  Objective: Vital signs in last 24 hours: Temp:  [97 F (36.1 C)-98.6 F (37 C)] 97 F (36.1 C) (10/21 1059) Pulse Rate:  [94-103] 103 (10/21 1059) Resp:  [16-20] 20 (10/21 1059) BP: (138-149)/(87-92) 138/87 (10/21 1059) SpO2:  [98 %-99 %] 98 % (10/21 1059) Estimated body mass index is 29.84 kg/m as calculated from the following:   Height as of 02/14/19: 5\' 3"  (1.6 m).   Weight as of 02/14/19: 76.4 kg.   Intake/Output from previous day: No intake/output data recorded. Intake/Output this shift: Total I/O In: 1700 [I.V.:1700] Out: 650 [Urine:450; Blood:200]  Physical exam the patient is somnolent but arousable.  She is moving her lower extremities well including her bilateral psoas.  Lab Results: No results for input(s): WBC, HGB, HCT, PLT in the last 72 hours. BMET No results for input(s): NA, K, CL, CO2, GLUCOSE, BUN, CREATININE, CALCIUM in the last 72 hours.  Studies/Results: Dg Lumbar Spine 2-3 Views  Result Date: 02/21/2019 CLINICAL DATA:  Lumbar fusion. EXAM: DG C-ARM 1-60 MIN; LUMBAR SPINE - 2-3 VIEW COMPARISON:  None. FINDINGS: Two fluoroscopic intraoperative spot films demonstrate placement of a lateral plate and screws and interbody fusion device at L4-5. Good position and alignment without complicating features. IMPRESSION: L4-5 fusion hardware in good position without complicating features. Electronically Signed   By: Marijo Sanes M.D.   On: 02/21/2019 10:48   Dg C-arm 1-60 Min  Result Date: 02/21/2019 CLINICAL DATA:  Lumbar fusion. EXAM: DG C-ARM 1-60 MIN; LUMBAR SPINE - 2-3 VIEW COMPARISON:  None. FINDINGS: Two fluoroscopic intraoperative spot films demonstrate placement of a lateral plate and screws and interbody fusion device at L4-5. Good position and alignment without complicating features. IMPRESSION: L4-5 fusion hardware in good position without  complicating features. Electronically Signed   By: Marijo Sanes M.D.   On: 02/21/2019 10:48    Assessment/Plan: The patient is doing well.  I spoke with her husband.  LOS: 0 days     Ophelia Charter 02/21/2019, 11:15 AM

## 2019-02-21 NOTE — Evaluation (Signed)
Physical Therapy Evaluation Patient Details Name: Marissa Cook MRN: [id number] DOB: Apr 08, 1966 Today's Date: 02/21/2019   History of Present Illness  Pt is a 53 y/o female s/p L4-5 ALIF. PMH includes DM and anxiety.   Clinical Impression  Patient is s/p above surgery resulting in the deficits listed below (see PT Problem List). Pt with guarded gait secondary to pain this session. Required min guard A for mobility tasks. Educated about generalized walking program and back precautions. Reports husband can assist as needed upon d/c. Patient will benefit from skilled PT to increase their independence and safety with mobility (while adhering to their precautions) to allow discharge to the venue listed below.     Follow Up Recommendations No PT follow up    Equipment Recommendations  None recommended by PT    Recommendations for Other Services       Precautions / Restrictions Precautions Precautions: Back Precaution Booklet Issued: Yes (comment) Precaution Comments: Reviewed back precautions with pt.  Required Braces or Orthoses: Spinal Brace Spinal Brace: Lumbar corset;Applied in standing position Restrictions Weight Bearing Restrictions: No      Mobility  Bed Mobility Overal bed mobility: Needs Assistance Bed Mobility: Rolling;Sidelying to Sit;Sit to Sidelying Rolling: Supervision Sidelying to sit: Supervision     Sit to sidelying: Supervision General bed mobility comments: Supervision to ensure log roll technique. Cues for sequencing.   Transfers Overall transfer level: Needs assistance Equipment used: None Transfers: Sit to/from Stand Sit to Stand: Min assist         General transfer comment: Light min A for steadying assist to stand. Increased time to perform transfer secondary to pain.   Ambulation/Gait Ambulation/Gait assistance: Min guard Gait Distance (Feet): 120 Feet Assistive device: IV Pole Gait Pattern/deviations: Step-through pattern;Decreased  stride length Gait velocity: Decreased   General Gait Details: Very slow, guarded gait. Used IV pole for support. Reports LLE pain improved, however, did complain of pain in L groin area. Educated about generalized walking program to perform at home.   Stairs            Wheelchair Mobility    Modified Rankin (Stroke Patients Only)       Balance Overall balance assessment: Needs assistance Sitting-balance support: No upper extremity supported;Feet supported Sitting balance-Leahy Scale: Good     Standing balance support: No upper extremity supported;During functional activity;Single extremity supported Standing balance-Leahy Scale: Fair Standing balance comment: Able to maintain static standing without UE support.                              Pertinent Vitals/Pain Pain Assessment: Faces Faces Pain Scale: Hurts even more Pain Location: back Pain Descriptors / Indicators: Aching;Operative site guarding Pain Intervention(s): Limited activity within patient's tolerance;Monitored during session;Repositioned    Home Living Family/patient expects to be discharged to:: Private residence Living Arrangements: Spouse/significant other;Children Available Help at Discharge: Family Type of Home: House Home Access: Stairs to enter Entrance Stairs-Rails: None Technical brewer of Steps: 1 Home Layout: One level Home Equipment: None      Prior Function Level of Independence: Independent               Hand Dominance        Extremity/Trunk Assessment   Upper Extremity Assessment Upper Extremity Assessment: Defer to OT evaluation    Lower Extremity Assessment Lower Extremity Assessment: LLE deficits/detail LLE Deficits / Details: Reports LLE pain had improved since prior to surgery. Did complain  of pain in L groin area.     Cervical / Trunk Assessment Cervical / Trunk Assessment: Other exceptions Cervical / Trunk Exceptions: s/p ALIF    Communication   Communication: No difficulties  Cognition Arousal/Alertness: Awake/alert Behavior During Therapy: WFL for tasks assessed/performed Overall Cognitive Status: Within Functional Limits for tasks assessed                                        General Comments General comments (skin integrity, edema, etc.): Pt's husband present durign session.     Exercises     Assessment/Plan    PT Assessment Patient needs continued PT services  PT Problem List Decreased balance;Decreased mobility;Decreased activity tolerance;Pain       PT Treatment Interventions Gait training;Stair training;Functional mobility training;Therapeutic activities;Therapeutic exercise;Balance training;Patient/family education    PT Goals (Current goals can be found in the Care Plan section)  Acute Rehab PT Goals Patient Stated Goal: to decrease pain  PT Goal Formulation: With patient Time For Goal Achievement: 03/07/19 Potential to Achieve Goals: Good    Frequency Min 5X/week   Barriers to discharge        Co-evaluation               AM-PAC PT "6 Clicks" Mobility  Outcome Measure Help needed turning from your back to your side while in a flat bed without using bedrails?: None Help needed moving from lying on your back to sitting on the side of a flat bed without using bedrails?: A Little Help needed moving to and from a bed to a chair (including a wheelchair)?: A Little Help needed standing up from a chair using your arms (e.g., wheelchair or bedside chair)?: A Little Help needed to walk in hospital room?: A Little Help needed climbing 3-5 steps with a railing? : A Little 6 Click Score: 19    End of Session Equipment Utilized During Treatment: Gait belt Activity Tolerance: Patient limited by pain Patient left: in bed;with call bell/phone within reach;with family/visitor present Nurse Communication: Mobility status PT Visit Diagnosis: Other abnormalities of gait and  mobility (R26.89);Pain Pain - Right/Left: Left Pain - part of body: (groin, and back)    Time: [id number] PT Time Calculation (min) (ACUTE ONLY): 23 min   Charges:   PT Evaluation $PT Eval Low Complexity: 1 Low PT Treatments $Gait Training: 8-22 mins        Leighton Ruff, PT, DPT  Acute Rehabilitation Services  Pager: [phone] Office: [phone]   Rudean Hitt 02/21/2019, 6:23 PM

## 2019-02-21 NOTE — Anesthesia Procedure Notes (Signed)
Procedure Name: Intubation Date/Time: 02/21/2019 8:41 AM Performed by: Inda Coke, CRNA Pre-anesthesia Checklist: Patient identified, Emergency Drugs available, Suction available and Patient being monitored Patient Re-evaluated:Patient Re-evaluated prior to induction Oxygen Delivery Method: Circle System Utilized Preoxygenation: Pre-oxygenation with 100% oxygen Induction Type: IV induction Ventilation: Mask ventilation without difficulty Laryngoscope Size: Mac and 3 Grade View: Grade I Tube type: Oral Tube size: 7.0 mm Number of attempts: 1 Airway Equipment and Method: Stylet and Oral airway Placement Confirmation: ETT inserted through vocal cords under direct vision,  positive ETCO2 and breath sounds checked- equal and bilateral Secured at: 22 cm Tube secured with: Tape Dental Injury: Teeth and Oropharynx as per pre-operative assessment

## 2019-02-21 NOTE — Op Note (Signed)
Brief history: The patient is a 53 year old diabetic white female who has complained of back and left leg pain.  She has failed medical management and was worked up with lumbar x-rays and lumbar MRI which demonstrated an L4-5 spondylolisthesis and degenerative disc disease.  I discussed the various treatment options with her.  She has weighed the risks, benefits and alternatives of surgery and decided proceed with a L4-5 anterior lateral interbody fusion and instrumentation.  Preop diagnosis: L4-5 degenerative disc disease, spondylolisthesis, lumbago, lumbar radiculopathy  Postop diagnosis: The same  Procedure: Left L4-5 anterior lateral interbody discectomy; L4-5 anterior lateral interbody fusion with Zimmer bone graft extender; insertion of interbody prosthesis L4-5 (NuVasive): Left anterior lateral rotation with NuVasive titanium plate and screws  Surgeon: Dr. Earle Gell  Assistant: Arnetha Massy nurse practitioner  Anesthesia: General tracheal  Estimated blood loss: 100 cc  Specimens: None  Drains: None  Complications: None  Description of procedure: The patient was brought to the operating room by the anesthesia team.  General endotracheal anesthesia was induced.  The patient was turned to the lateral position with her left side up.  An axillary roll was placed.  The operative bed was flexed.  The patient was appropriately padded and secured with tape.  I then injected the area to be incised with Marcaine with epinephrine solution.  I made 2 incisions.  One was just posterior to the L4-5 to space.  The second incision was just lateral to the L4-5 disc space.  I used blunt dissection and dissected into the retroperitoneal space and palpate the lateral psoas and lateral vertebral bodies.  I then placed the first dilator through the lateral incision guiding it down with my finger to the lateral aspect of the L4-5 vertebral body/psoas.  Using lateral fluoroscopy we docked the first dilator  at the L4-5 interspace.  We stimulated 360 degrees without electrodiagnostic concerns.  We placed a K wire through the first dilator using both AP and lateral fluoroscopy.  We then placed the second third dilator over the first dilator again stimulating in 360 degrees.  We then placed the retractor over the dilators, again confirmed the good position with AP and lateral fluoroscopy.  We remove the dilators and stimulated with a ball probe.  Again there were no electrodiagnostic concerns.  We then placed the shim under both AP and lateral fluoroscopy.  We opened the retractor and again stimulated without concerns.  Having placed the retractor I incised the left lateral L4-5 intervertebral disc with 15 blade scalpel.  We performed discectomy using the pituitary forceps, Kerrison punches, curettes, etc.  We prepared the vertebral endplates with a rasp.  We then used the trials and determined to use a 8 mm interbody prosthesis.  We prefilled these prosthesis with intragrow.  We then inserted the prosthesis into the L4 vertebral body laterally under both AP and lateral fluoroscopy.  There was a good snug fit of the prosthesis.  We now turned our attention to the anterior lateral instrumentation.  We placed a NuVasive lateral plate over the lateral aspect of the L4-5 vertebral bodies.  We used the awl to create a drill hole in the L4 and L5 vertebral body.  We inserted a 50 mm screw into the vertebral body at L4 and L5 under fluoroscopic guidance.  We got good bony purchase.  We then secured the screws with the cams completing the instrumentation.  We obtained hemostasis with FloSeal.  We then released and remove the retractor.  There was  no bleeding under direct visualization.  We then reapproximated the patient's fascia with interrupted 0 Vicryl suture.  Reapproximated the subcutaneous tissue with interrupted 2-0 Vicryl suture.  We reapproximate the skin with Steri-Strips and benzoin.  The wound was then coated  with bacitracin ointment.  A sterile dressing was applied.  The drapes were removed.  By report all sponge, instrument, and needle counts were correct at the end of this case.

## 2019-02-21 NOTE — Transfer of Care (Signed)
Immediate Anesthesia Transfer of Care Note  Patient: Marissa Cook  Procedure(s) Performed: ANTERIOR LATERAL LUMBAR INTERBODY FUSION, LATERAL INSTRUMENTATION LUMBAR FOUR- LUMBAR FIVE (N/A Spine Lumbar)  Patient Location: PACU  Anesthesia Type:General  Level of Consciousness: awake and alert   Airway & Oxygen Therapy: Patient Spontanous Breathing and Patient connected to nasal cannula oxygen  Post-op Assessment: Report given to RN and Post -op Vital signs reviewed and stable  Post vital signs: Reviewed and stable  Last Vitals:  Vitals Value Taken Time  BP 138/87 02/21/19 1059  Temp    Pulse 105 02/21/19 1102  Resp 19 02/21/19 1102  SpO2 98 % 02/21/19 1102  Vitals shown include unvalidated device data.  Last Pain:  Vitals:   02/21/19 0659  TempSrc: Oral  PainSc:       Patients Stated Pain Goal: 3 ([id number])  Complications: No apparent anesthesia complications

## 2019-02-22 ENCOUNTER — Encounter (HOSPITAL_COMMUNITY): Payer: Self-pay | Admitting: Neurosurgery

## 2019-02-22 LAB — BASIC METABOLIC PANEL
Anion gap: 12 (ref 5–15)
BUN: 9 mg/dL (ref 6–20)
CO2: 23 mmol/L (ref 22–32)
Calcium: 9.2 mg/dL (ref 8.9–10.3)
Chloride: 102 mmol/L (ref 98–111)
Creatinine, Ser: 0.74 mg/dL (ref 0.44–1.00)
GFR calc Af Amer: >60 mL/min (ref >60)
GFR calc non Af Amer: >60 mL/min (ref >60)
Glucose, Bld: 195 mg/dL — ABNORMAL HIGH (ref 70–99)
Potassium: 3.2 mmol/L — ABNORMAL LOW (ref 3.5–5.1)
Sodium: 137 mmol/L (ref 135–145)

## 2019-02-22 LAB — GLUCOSE, CAPILLARY: Glucose-Capillary: 224 mg/dL — ABNORMAL HIGH (ref 70–99)

## 2019-02-22 LAB — CBC
HCT: 38.4 % (ref 36.0–46.0)
Hemoglobin: 13.3 g/dL (ref 12.0–15.0)
MCH: 32.3 pg (ref 26.0–34.0)
MCHC: 34.6 g/dL (ref 30.0–36.0)
MCV: 93.2 fL (ref 80.0–100.0)
Platelets: 357 10*3/uL (ref 150–400)
RBC: 4.12 MIL/uL (ref 3.87–5.11)
RDW: 12.1 % (ref 11.5–15.5)
WBC: 11.6 10*3/uL — ABNORMAL HIGH (ref 4.0–10.5)
nRBC: 0 % (ref 0.0–0.2)

## 2019-02-22 MED ORDER — POTASSIUM CHLORIDE CRYS ER 20 MEQ PO TBCR
40.0000 meq | EXTENDED_RELEASE_TABLET | Freq: Once | ORAL | Status: AC
  Administered 2019-02-22: 40 meq via ORAL
  Filled 2019-02-22: qty 2

## 2019-02-22 MED ORDER — DOCUSATE SODIUM 100 MG PO CAPS: 100.0000 mg | ORAL_CAPSULE | Freq: Two times a day (BID) | ORAL | 0 refills | Status: AC

## 2019-02-22 MED ORDER — OXYCODONE HCL 5 MG PO TABS: 5.0000 mg | ORAL_TABLET | ORAL | 0 refills | Status: AC | PRN

## 2019-02-22 MED ORDER — CYCLOBENZAPRINE HCL 10 MG PO TABS: 10.0000 mg | ORAL_TABLET | Freq: Three times a day (TID) | ORAL | 0 refills | Status: AC | PRN

## 2019-02-22 NOTE — Anesthesia Postprocedure Evaluation (Signed)
Anesthesia Post Note  Patient: Marissa Cook  Procedure(s) Performed: ANTERIOR LATERAL LUMBAR INTERBODY FUSION, LATERAL INSTRUMENTATION LUMBAR FOUR- LUMBAR FIVE (N/A Spine Lumbar)     Patient location during evaluation: PACU Anesthesia Type: General Level of consciousness: awake and alert Pain management: pain level controlled Vital Signs Assessment: post-procedure vital signs reviewed and stable Respiratory status: spontaneous breathing, nonlabored ventilation, respiratory function stable and patient connected to nasal cannula oxygen Cardiovascular status: blood pressure returned to baseline and stable Postop Assessment: no apparent nausea or vomiting Anesthetic complications: no    Last Vitals:  Vitals:   02/22/19 0328 02/22/19 0747  BP: 112/76 112/71  Pulse: 95 97  Resp: 20 16  Temp: 37 C 36.8 C  SpO2: 93% 99%    Last Pain:  Vitals:   02/22/19 0747  TempSrc: Oral  PainSc:                  [name]

## 2019-02-22 NOTE — Progress Notes (Signed)
Physical Therapy Treatment Patient Details Name: Marissa Cook MRN: [id number] DOB: [birth year] Today's Date: 02/22/2019    History of Present Illness Pt is a 53 y/o female s/p L4-5 ALIF. PMH includes DM and anxiety.     PT Comments    Pt progressing towards physical therapy goals. Was able to recall 3/3 precautions without cues and maintained well throughout session. Pt donned brace without assistance. We reviewed brace wearing schedule, car transfer, and activity progression. Will sign off at this time as pt is grossly functioning at a mod I level and has met acute rehab goals. If needs change, please reconsult.     Follow Up Recommendations  No PT follow up     Equipment Recommendations  None recommended by PT    Recommendations for Other Services       Precautions / Restrictions Precautions Precautions: Back Precaution Booklet Issued: Yes (comment) Precaution Comments: Reviewed back precautions with pt.  Required Braces or Orthoses: Spinal Brace Spinal Brace: Lumbar corset;Applied in standing position Restrictions Weight Bearing Restrictions: No    Mobility  Bed Mobility Overal bed mobility: Needs Assistance Bed Mobility: Rolling;Sidelying to Sit;Sit to Sidelying Rolling: Modified independent (Device/Increase time) Sidelying to sit: Modified independent (Device/Increase time)     Sit to sidelying: Supervision General bed mobility comments: Supervision for cues and to ensure log roll technique with sit>sidelying.   Transfers Overall transfer level: Modified independent Equipment used: None Transfers: Sit to/from Stand           General transfer comment: Pt demonstrated proper hand placement on seated surface for safety. No assist required.   Ambulation/Gait Ambulation/Gait assistance: Modified independent (Device/Increase time) Gait Distance (Feet): 400 Feet Assistive device: None Gait Pattern/deviations: Step-through pattern;Decreased stride  length Gait velocity: Decreased Gait velocity interpretation: 1.31 - 2.62 ft/sec, indicative of limited community ambulator General Gait Details: Mildly decreased gait speed but overall ambulating well. No unsteadiness or LOB noted.    Stairs             Wheelchair Mobility    Modified Rankin (Stroke Patients Only)       Balance Overall balance assessment: Needs assistance Sitting-balance support: No upper extremity supported;Feet supported Sitting balance-Leahy Scale: Good     Standing balance support: No upper extremity supported;During functional activity;Single extremity supported Standing balance-Leahy Scale: Fair Standing balance comment: Able to maintain static standing without UE support.                             Cognition Arousal/Alertness: Awake/alert Behavior During Therapy: WFL for tasks assessed/performed Overall Cognitive Status: Within Functional Limits for tasks assessed                                        Exercises      General Comments        Pertinent Vitals/Pain Pain Assessment: Faces Faces Pain Scale: Hurts even more Pain Location: back Pain Descriptors / Indicators: Aching;Operative site guarding Pain Intervention(s): Limited activity within patient's tolerance;Monitored during session;Repositioned    Home Living                      Prior Function            PT Goals (current goals can now be found in the care plan section) Acute Rehab PT Goals Patient Stated Goal: to  decrease pain  PT Goal Formulation: With patient Time For Goal Achievement: 03/07/19 Potential to Achieve Goals: Good Progress towards PT goals: Progressing toward goals    Frequency    Min 5X/week      PT Plan Current plan remains appropriate    Co-evaluation              AM-PAC PT "6 Clicks" Mobility   Outcome Measure  Help needed turning from your back to your side while in a flat bed without using  bedrails?: None Help needed moving from lying on your back to sitting on the side of a flat bed without using bedrails?: A Little Help needed moving to and from a bed to a chair (including a wheelchair)?: A Little Help needed standing up from a chair using your arms (e.g., wheelchair or bedside chair)?: A Little Help needed to walk in hospital room?: A Little Help needed climbing 3-5 steps with a railing? : A Little 6 Click Score: 19    End of Session Equipment Utilized During Treatment: Gait belt Activity Tolerance: Patient tolerated treatment well Patient left: in bed;with call bell/phone within reach;with family/visitor present Nurse Communication: Mobility status PT Visit Diagnosis: Other abnormalities of gait and mobility (R26.89);Pain Pain - Right/Left: Left Pain - part of body: (groin, and back)     Time: 0102-7253 PT Time Calculation (min) (ACUTE ONLY): 15 min  Charges:  $Gait Training: 8-22 mins                     Rolinda Roan, PT, DPT Acute Rehabilitation Services Pager: [phone] Office: [phone]    Thelma Comp 02/22/2019, 9:38 AM

## 2019-02-22 NOTE — Discharge Summary (Addendum)
Physician Discharge Summary  Patient ID: Marissa Cook MRN: [id number] DOB/AGE: [birth year] 53 y.o.  Admit date: 02/21/2019 Discharge date: 02/22/2019  Admission Diagnoses: Lumbar spondylolisthesis, lumbar degenerative disc disease, lumbago, lumbar radiculopathy  Discharge Diagnoses: The same Active Problems:   Spondylolisthesis, lumbar region   Discharged Condition: good  Hospital Course: I performed an L4-5 XLIF and interbody prosthesis/instrumentation on the patient on 02/21/2019.  The surgery went well.  The patient's postoperative course was unremarkable.  On postoperative day #1 the patient felt much better.  Her leg pain was gone.  She requested discharge to home.  She was given written and oral discharge instructions.  All her questions were answered.  The patient's potassium was 3.2.  She was given a dose of K. Dur and recommend she follow-up with her primary doctor.  Consults: Physical therapy, Occupational Therapy, care management Significant Diagnostic Studies: None Treatments: L4-5 anterior lateral interbody arthrodesis, prosthesis and lateral instrumentation Discharge Exam: Blood pressure 112/71, pulse 97, temperature 98.2 F (36.8 C), temperature source Oral, resp. rate 16, SpO2 99 %. The patient is alert and pleasant.  She looks well.  Her strength is grossly normal in her lower extremities.  Disposition: Home  Discharge Instructions    Call MD for:  difficulty breathing, headache or visual disturbances   Complete by: As directed    Call MD for:  extreme fatigue   Complete by: As directed    Call MD for:  hives   Complete by: As directed    Call MD for:  persistant dizziness or light-headedness   Complete by: As directed    Call MD for:  persistant nausea and vomiting   Complete by: As directed    Call MD for:  redness, tenderness, or signs of infection (pain, swelling, redness, odor or green/yellow discharge around incision site)   Complete by: As  directed    Call MD for:  severe uncontrolled pain   Complete by: As directed    Call MD for:  temperature >100.4   Complete by: As directed    Diet - low sodium heart healthy   Complete by: As directed    Discharge instructions   Complete by: As directed    Call [phone] for a followup appointment. Take a stool softener while you are using pain medications.   Driving Restrictions   Complete by: As directed    Do not drive for 2 weeks.   Increase activity slowly   Complete by: As directed    Lifting restrictions   Complete by: As directed    Do not lift more than 5 pounds. No excessive bending or twisting.   May shower / Bathe   Complete by: As directed    Remove the dressing for 3 days after surgery.  You may shower, but leave the incision alone.   Remove dressing in 48 hours   Complete by: As directed    Your stitches are under the scan and will dissolve by themselves. The Steri-Strips will fall off after you take a few showers. Do not rub back or pick at the wound, Leave the wound alone.     Allergies as of 02/22/2019      Reactions   Sulfasalazine    UNSPECIFIED REACTION       Medication List    TAKE these medications   cyclobenzaprine 10 MG tablet Commonly known as: FLEXERIL Take 1 tablet (10 mg total) by mouth 3 (three) times daily as needed for muscle spasms.  docusate sodium 100 MG capsule Commonly known as: COLACE Take 1 capsule (100 mg total) by mouth 2 (two) times daily.   gabapentin 300 MG capsule Commonly known as: NEURONTIN Take 600 mg by mouth at bedtime.   insulin aspart 100 UNIT/ML injection Commonly known as: novoLOG Inject 10-20 Units into the skin 3 (three) times daily before meals.   levothyroxine 300 MCG tablet Commonly known as: SYNTHROID Take 300 mcg by mouth daily before breakfast.   metFORMIN 500 MG 24 hr tablet Commonly known as: GLUCOPHAGE-XR Take 500 mg by mouth 2 (two) times daily.   oxyCODONE 5 MG immediate release  tablet Commonly known as: Oxy IR/ROXICODONE Take 1 tablet (5 mg total) by mouth every 4 (four) hours as needed for moderate pain ((score 4 to 6)).   Tyler Aas FlexTouch 100 UNIT/ML Sopn FlexTouch Pen Generic drug: insulin degludec Inject 35 Units into the skin at bedtime.        Signed: Ophelia Charter 02/22/2019, 8:26 AM

## 2019-02-22 NOTE — Evaluation (Signed)
Occupational Therapy Evaluation Patient Details Name: Marissa Cook MRN: [id number] DOB: 28-Aug-1965 Today's Date: 02/22/2019    History of Present Illness Pt is a 53 y/o female s/p L4-5 ALIF. PMH includes DM and anxiety.    Clinical Impression   Pt PTA: Pt reports independence for ADL and mobility. Pt currently with no AD for mobility in room; few cues required for ADL tasks as pt performing figure 4 technique of crossing legs to be able to perform LB dress with supervisionA. Pt performing UB ADL with set-upA. Back handout provided and reviewed ADL in detail. Pt educated on: clothing between brace, never sleep in brace, set an alarm at night for medication, avoid sitting for long periods of time, correct bed positioning for sleeping, correct sequence for bed mobility, avoiding lifting more than 5 pounds and never wash directly over incision. All education is complete and patient indicates understanding. Pt does not require continued OT skilled services at this time. OT signing off.    Follow Up Recommendations  No OT follow up    Equipment Recommendations  None recommended by OT    Recommendations for Other Services       Precautions / Restrictions Precautions Precautions: Back Precaution Booklet Issued: Yes (comment) Precaution Comments: Reviewed back precautions with pt.  Required Braces or Orthoses: Spinal Brace Spinal Brace: Lumbar corset;Applied in standing position Restrictions Weight Bearing Restrictions: No      Mobility Bed Mobility Overal bed mobility: Needs Assistance Bed Mobility: Rolling;Sidelying to Sit Rolling: Modified independent (Device/Increase time) Sidelying to sit: Modified independent (Device/Increase time)     Sit to sidelying: Supervision General bed mobility comments: Supervision for cues and to ensure log roll technique with sit>sidelying.   Transfers Overall transfer level: Modified independent Equipment used: None Transfers: Sit to/from  Stand           General transfer comment: Pt demonstrated proper hand placement on seated surface for safety. No assist required.     Balance Overall balance assessment: Needs assistance Sitting-balance support: No upper extremity supported;Feet supported Sitting balance-Leahy Scale: Good     Standing balance support: No upper extremity supported;During functional activity;Single extremity supported Standing balance-Leahy Scale: Good Standing balance comment: Able to maintain static standing without UE support.                            ADL either performed or assessed with clinical judgement   ADL Overall ADL's : At baseline                                       General ADL Comments: Pt performing figure 4 technique of crossing legs to be able to perform LB dress with [place]. Pt performing UB ADL with set-upA     Vision Baseline Vision/History: No visual deficits Vision Assessment?: No apparent visual deficits     Perception     Praxis      Pertinent Vitals/Pain Pain Assessment: 0-10 Pain Score: 5  Faces Pain Scale: Hurts even more Pain Location: back Pain Descriptors / Indicators: Aching;Operative site guarding Pain Intervention(s): Premedicated before session;Limited activity within patient's tolerance     Hand Dominance Right   Extremity/Trunk Assessment Upper Extremity Assessment Upper Extremity Assessment: Overall WFL for tasks assessed   Lower Extremity Assessment Lower Extremity Assessment: Overall WFL for tasks assessed   Cervical / Trunk Assessment Cervical /  Trunk Assessment: Other exceptions Cervical / Trunk Exceptions: s/p ALIF    Communication Communication Communication: No difficulties   Cognition Arousal/Alertness: Awake/alert Behavior During Therapy: WFL for tasks assessed/performed Overall Cognitive Status: Within Functional Limits for tasks assessed                                      General Comments       Exercises     Shoulder Instructions      Home Living Family/patient expects to be discharged to:: Private residence Living Arrangements: Spouse/significant other;Children Available Help at Discharge: Family Type of Home: House Home Access: Stairs to enter Technical brewer of Steps: 1 Entrance Stairs-Rails: None Home Layout: One level     Bathroom Shower/Tub: Occupational psychologist: Handicapped height     Home Equipment: Shower seat          Prior Functioning/Environment Level of Independence: Independent                 OT Problem List: Decreased strength;Decreased safety awareness;Decreased activity tolerance      OT Treatment/Interventions:      OT Goals(Current goals can be found in the care plan section) Acute Rehab OT Goals Patient Stated Goal: to decrease pain  OT Goal Formulation: With patient  OT Frequency:     Barriers to D/C:            Co-evaluation              AM-PAC OT "6 Clicks" Daily Activity     Outcome Measure Help from another person eating meals?: None Help from another person taking care of personal grooming?: None Help from another person toileting, which includes using toliet, bedpan, or urinal?: None Help from another person bathing (including washing, rinsing, drying)?: None Help from another person to put on and taking off regular upper body clothing?: None Help from another person to put on and taking off regular lower body clothing?: None 6 Click Score: 24   End of Session Equipment Utilized During Treatment: Back brace Nurse Communication: Mobility status  Activity Tolerance: Patient tolerated treatment well Patient left: in bed;with call bell/phone within reach  OT Visit Diagnosis: Unsteadiness on feet (R26.81);Muscle weakness (generalized) (M62.81)                Time: [id number] OT Time Calculation (min): 17 min Charges:  OT General Charges $OT Visit: 1 Visit OT  Evaluation $OT Eval Moderate Complexity: 1 Mod  Darryl Nestle) Marsa Aris OTR/L Acute Rehabilitation Services Pager: [phone] Office: [phone]   Audie Pinto 02/22/2019, 10:46 AM

## 2019-02-22 NOTE — Plan of Care (Signed)
Patient alert and oriented, mae's well, voiding adequate amount of urine, swallowing without difficulty, no c/o pain. Patient discharged home with family. Script and discharged instructions given to patient. Patient and family stated understanding of d/c instructions given and has an appointment with Dr. Arnoldo Morale in three weeks.

## 2019-02-23 MED FILL — Heparin Sodium (Porcine) Inj 1000 Unit/ML: INTRAMUSCULAR | Qty: 30 | Status: AC

## 2019-02-23 MED FILL — Sodium Chloride IV Soln 0.9%: INTRAVENOUS | Qty: 1000 | Status: AC

## 2019-05-18 ENCOUNTER — Other Ambulatory Visit: Payer: Self-pay | Admitting: Internal Medicine

## 2019-05-18 DIAGNOSIS — R4182 Altered mental status, unspecified: Secondary | ICD-10-CM

## 2019-05-24 ENCOUNTER — Ambulatory Visit: Payer: BC Managed Care – PPO

## 2019-05-29 ENCOUNTER — Ambulatory Visit: Payer: BC Managed Care – PPO

## 2019-06-13 ENCOUNTER — Other Ambulatory Visit: Payer: Self-pay | Admitting: Neurology

## 2019-06-13 DIAGNOSIS — R410 Disorientation, unspecified: Secondary | ICD-10-CM

## 2019-06-20 ENCOUNTER — Ambulatory Visit
Admission: RE | Admit: 2019-06-20 | Discharge: 2019-06-20 | Disposition: A | Discharge status: Home / Self Care | Payer: BC Managed Care – PPO | Source: Ambulatory Visit | Attending: Neurology | Admitting: Neurology

## 2019-06-20 ENCOUNTER — Other Ambulatory Visit: Payer: Self-pay

## 2019-06-20 DIAGNOSIS — R410 Disorientation, unspecified: Secondary | ICD-10-CM | POA: Diagnosis present

## 2020-01-29 ENCOUNTER — Emergency Department: Payer: BC Managed Care – PPO

## 2020-01-29 ENCOUNTER — Other Ambulatory Visit: Payer: Self-pay

## 2020-01-29 ENCOUNTER — Inpatient Hospital Stay
Admission: EM | Admit: 2020-01-29 | Discharge: 2020-03-03 | DRG: 207 | Disposition: E | Payer: BC Managed Care – PPO | Attending: Internal Medicine | Admitting: Internal Medicine

## 2020-01-29 DIAGNOSIS — U071 COVID-19: Principal | ICD-10-CM | POA: Diagnosis present

## 2020-01-29 DIAGNOSIS — R570 Cardiogenic shock: Secondary | ICD-10-CM | POA: Diagnosis present

## 2020-01-29 DIAGNOSIS — J9601 Acute respiratory failure with hypoxia: Secondary | ICD-10-CM

## 2020-01-29 DIAGNOSIS — H518 Other specified disorders of binocular movement: Secondary | ICD-10-CM | POA: Diagnosis present

## 2020-01-29 DIAGNOSIS — Z5309 Procedure and treatment not carried out because of other contraindication: Secondary | ICD-10-CM | POA: Diagnosis not present

## 2020-01-29 DIAGNOSIS — R601 Generalized edema: Secondary | ICD-10-CM | POA: Diagnosis present

## 2020-01-29 DIAGNOSIS — F32A Depression, unspecified: Secondary | ICD-10-CM | POA: Diagnosis present

## 2020-01-29 DIAGNOSIS — J9600 Acute respiratory failure, unspecified whether with hypoxia or hypercapnia: Secondary | ICD-10-CM

## 2020-01-29 DIAGNOSIS — R0902 Hypoxemia: Secondary | ICD-10-CM

## 2020-01-29 DIAGNOSIS — R569 Unspecified convulsions: Secondary | ICD-10-CM | POA: Diagnosis present

## 2020-01-29 DIAGNOSIS — Z66 Do not resuscitate: Secondary | ICD-10-CM | POA: Diagnosis not present

## 2020-01-29 DIAGNOSIS — L8990 Pressure ulcer of unspecified site, unspecified stage: Secondary | ICD-10-CM | POA: Insufficient documentation

## 2020-01-29 DIAGNOSIS — Z6841 Body Mass Index (BMI) 40.0 and over, adult: Secondary | ICD-10-CM

## 2020-01-29 DIAGNOSIS — R140 Abdominal distension (gaseous): Secondary | ICD-10-CM | POA: Diagnosis not present

## 2020-01-29 DIAGNOSIS — I10 Essential (primary) hypertension: Secondary | ICD-10-CM | POA: Diagnosis present

## 2020-01-29 DIAGNOSIS — T383X6A Underdosing of insulin and oral hypoglycemic [antidiabetic] drugs, initial encounter: Secondary | ICD-10-CM | POA: Diagnosis present

## 2020-01-29 DIAGNOSIS — G9341 Metabolic encephalopathy: Secondary | ICD-10-CM | POA: Diagnosis present

## 2020-01-29 DIAGNOSIS — F329 Major depressive disorder, single episode, unspecified: Secondary | ICD-10-CM | POA: Diagnosis present

## 2020-01-29 DIAGNOSIS — M5136 Other intervertebral disc degeneration, lumbar region: Secondary | ICD-10-CM | POA: Diagnosis present

## 2020-01-29 DIAGNOSIS — G928 Other toxic encephalopathy: Secondary | ICD-10-CM | POA: Diagnosis not present

## 2020-01-29 DIAGNOSIS — J189 Pneumonia, unspecified organism: Secondary | ICD-10-CM | POA: Diagnosis not present

## 2020-01-29 DIAGNOSIS — Z981 Arthrodesis status: Secondary | ICD-10-CM

## 2020-01-29 DIAGNOSIS — Z882 Allergy status to sulfonamides status: Secondary | ICD-10-CM

## 2020-01-29 DIAGNOSIS — R6521 Severe sepsis with septic shock: Secondary | ICD-10-CM | POA: Diagnosis not present

## 2020-01-29 DIAGNOSIS — E039 Hypothyroidism, unspecified: Secondary | ICD-10-CM | POA: Diagnosis present

## 2020-01-29 DIAGNOSIS — Z794 Long term (current) use of insulin: Secondary | ICD-10-CM

## 2020-01-29 DIAGNOSIS — K760 Fatty (change of) liver, not elsewhere classified: Secondary | ICD-10-CM | POA: Diagnosis present

## 2020-01-29 DIAGNOSIS — R578 Other shock: Secondary | ICD-10-CM | POA: Diagnosis present

## 2020-01-29 DIAGNOSIS — N179 Acute kidney failure, unspecified: Secondary | ICD-10-CM | POA: Diagnosis not present

## 2020-01-29 DIAGNOSIS — R4182 Altered mental status, unspecified: Secondary | ICD-10-CM

## 2020-01-29 DIAGNOSIS — D649 Anemia, unspecified: Secondary | ICD-10-CM | POA: Diagnosis present

## 2020-01-29 DIAGNOSIS — F419 Anxiety disorder, unspecified: Secondary | ICD-10-CM | POA: Diagnosis present

## 2020-01-29 DIAGNOSIS — Z452 Encounter for adjustment and management of vascular access device: Secondary | ICD-10-CM

## 2020-01-29 DIAGNOSIS — R600 Localized edema: Secondary | ICD-10-CM

## 2020-01-29 DIAGNOSIS — J80 Acute respiratory distress syndrome: Secondary | ICD-10-CM | POA: Diagnosis present

## 2020-01-29 DIAGNOSIS — Z8673 Personal history of transient ischemic attack (TIA), and cerebral infarction without residual deficits: Secondary | ICD-10-CM

## 2020-01-29 DIAGNOSIS — E785 Hyperlipidemia, unspecified: Secondary | ICD-10-CM | POA: Diagnosis present

## 2020-01-29 DIAGNOSIS — Z79890 Hormone replacement therapy (postmenopausal): Secondary | ICD-10-CM

## 2020-01-29 DIAGNOSIS — R0603 Acute respiratory distress: Secondary | ICD-10-CM

## 2020-01-29 DIAGNOSIS — J1282 Pneumonia due to coronavirus disease 2019: Secondary | ICD-10-CM | POA: Diagnosis present

## 2020-01-29 DIAGNOSIS — Z9911 Dependence on respirator [ventilator] status: Secondary | ICD-10-CM

## 2020-01-29 DIAGNOSIS — A4189 Other specified sepsis: Secondary | ICD-10-CM | POA: Diagnosis not present

## 2020-01-29 DIAGNOSIS — Z4659 Encounter for fitting and adjustment of other gastrointestinal appliance and device: Secondary | ICD-10-CM

## 2020-01-29 DIAGNOSIS — I82409 Acute embolism and thrombosis of unspecified deep veins of unspecified lower extremity: Secondary | ICD-10-CM

## 2020-01-29 DIAGNOSIS — Z79899 Other long term (current) drug therapy: Secondary | ICD-10-CM

## 2020-01-29 DIAGNOSIS — I471 Supraventricular tachycardia: Secondary | ICD-10-CM | POA: Diagnosis not present

## 2020-01-29 DIAGNOSIS — Z01818 Encounter for other preprocedural examination: Secondary | ICD-10-CM

## 2020-01-29 DIAGNOSIS — Z7982 Long term (current) use of aspirin: Secondary | ICD-10-CM

## 2020-01-29 DIAGNOSIS — E1110 Type 2 diabetes mellitus with ketoacidosis without coma: Secondary | ICD-10-CM | POA: Diagnosis present

## 2020-01-29 DIAGNOSIS — E0810 Diabetes mellitus due to underlying condition with ketoacidosis without coma: Secondary | ICD-10-CM

## 2020-01-29 LAB — CBC WITH DIFFERENTIAL/PLATELET
Abs Immature Granulocytes: 0.22 10*3/uL — ABNORMAL HIGH (ref 0.00–0.07)
Basophils Absolute: 0 10*3/uL (ref 0.0–0.1)
Basophils Relative: 0 %
Eosinophils Absolute: 0.4 10*3/uL (ref 0.0–0.5)
Eosinophils Relative: 3 %
HCT: 47.2 % — ABNORMAL HIGH (ref 36.0–46.0)
Hemoglobin: 15.8 g/dL — ABNORMAL HIGH (ref 12.0–15.0)
Immature Granulocytes: 2 %
Lymphocytes Relative: 10 %
Lymphs Abs: 1.3 10*3/uL (ref 0.7–4.0)
MCH: 30.4 pg (ref 26.0–34.0)
MCHC: 33.5 g/dL (ref 30.0–36.0)
MCV: 90.9 fL (ref 80.0–100.0)
Monocytes Absolute: 0.8 10*3/uL (ref 0.1–1.0)
Monocytes Relative: 7 %
Neutro Abs: 9.9 10*3/uL — ABNORMAL HIGH (ref 1.7–7.7)
Neutrophils Relative %: 78 %
Platelets: 200 10*3/uL (ref 150–400)
RBC: 5.19 MIL/uL — ABNORMAL HIGH (ref 3.87–5.11)
RDW: 14.1 % (ref 11.5–15.5)
WBC: 12.7 10*3/uL — ABNORMAL HIGH (ref 4.0–10.5)
nRBC: 0 % (ref 0.0–0.2)

## 2020-01-29 LAB — BLOOD GAS, ARTERIAL
Acid-base deficit: 23.6 mmol/L — ABNORMAL HIGH (ref 0.0–2.0)
Bicarbonate: 4.2 mmol/L — ABNORMAL LOW (ref 20.0–28.0)
FIO2: 0.28
O2 Saturation: 87.3 %
Patient temperature: 37
pCO2 arterial: <19 mmHg — CL (ref 32.0–48.0)
pH, Arterial: 7.08 — CL (ref 7.350–7.450)
pO2, Arterial: 75 mmHg — ABNORMAL LOW (ref 83.0–108.0)

## 2020-01-29 LAB — COMPREHENSIVE METABOLIC PANEL
ALT: 41 U/L (ref 0–44)
AST: 68 U/L — ABNORMAL HIGH (ref 15–41)
Albumin: 3.6 g/dL (ref 3.5–5.0)
Alkaline Phosphatase: 114 U/L (ref 38–126)
Anion gap: 18 — ABNORMAL HIGH (ref 5–15)
BUN: 28 mg/dL — ABNORMAL HIGH (ref 6–20)
CO2: 11 mmol/L — ABNORMAL LOW (ref 22–32)
Calcium: 8.3 mg/dL — ABNORMAL LOW (ref 8.9–10.3)
Chloride: 103 mmol/L (ref 98–111)
Creatinine, Ser: 1.19 mg/dL — ABNORMAL HIGH (ref 0.44–1.00)
GFR calc Af Amer: >60 mL/min (ref >60)
GFR calc non Af Amer: 53 mL/min — ABNORMAL LOW (ref >60)
Glucose, Bld: 507 mg/dL (ref 70–99)
Potassium: 5.1 mmol/L (ref 3.5–5.1)
Sodium: 132 mmol/L — ABNORMAL LOW (ref 135–145)
Total Bilirubin: 1.8 mg/dL — ABNORMAL HIGH (ref 0.3–1.2)
Total Protein: 8 g/dL (ref 6.5–8.1)

## 2020-01-29 LAB — RESPIRATORY PANEL BY RT PCR (FLU A&B, COVID)
Influenza A by PCR: NEGATIVE
Influenza B by PCR: NEGATIVE
SARS Coronavirus 2 by RT PCR: POSITIVE — AB

## 2020-01-29 LAB — LACTIC ACID, PLASMA: Lactic Acid, Venous: 2.7 mmol/L (ref 0.5–1.9)

## 2020-01-29 LAB — ACETAMINOPHEN LEVEL: Acetaminophen (Tylenol), Serum: <10 ug/mL — ABNORMAL LOW (ref 10–30)

## 2020-01-29 LAB — GLUCOSE, CAPILLARY
Glucose-Capillary: 413 mg/dL — ABNORMAL HIGH (ref 70–99)
Glucose-Capillary: 474 mg/dL — ABNORMAL HIGH (ref 70–99)

## 2020-01-29 LAB — BETA-HYDROXYBUTYRIC ACID: Beta-Hydroxybutyric Acid: 7.42 mmol/L — ABNORMAL HIGH (ref 0.05–0.27)

## 2020-01-29 LAB — PROCALCITONIN: Procalcitonin: 0.18 ng/mL

## 2020-01-29 LAB — SALICYLATE LEVEL: Salicylate Lvl: <7 mg/dL — ABNORMAL LOW (ref 7.0–30.0)

## 2020-01-29 LAB — FIBRIN DERIVATIVES D-DIMER (ARMC ONLY): Fibrin derivatives D-dimer (ARMC): 3140.7 ng/mL (FEU) — ABNORMAL HIGH (ref 0.00–499.00)

## 2020-01-29 LAB — BRAIN NATRIURETIC PEPTIDE: B Natriuretic Peptide: 64.9 pg/mL (ref 0.0–100.0)

## 2020-01-29 LAB — TROPONIN I (HIGH SENSITIVITY): Troponin I (High Sensitivity): 13 ng/L (ref <18)

## 2020-01-29 MED ORDER — SODIUM CHLORIDE 0.9 % IV BOLUS
1000.0000 mL | Freq: Once | INTRAVENOUS | Status: AC
  Administered 2020-01-29: 1000 mL via INTRAVENOUS

## 2020-01-29 MED ORDER — SODIUM CHLORIDE 0.9 % IV SOLN: Freq: Once | INTRAVENOUS | Status: AC

## 2020-01-29 MED ORDER — INSULIN REGULAR(HUMAN) IN NACL 100-0.9 UT/100ML-% IV SOLN
INTRAVENOUS | Status: DC
  Administered 2020-01-29: 5.5 [IU]/h via INTRAVENOUS
  Administered 2020-01-30: 7.5 [IU]/h via INTRAVENOUS
  Filled 2020-01-29 (×2): qty 100

## 2020-01-29 MED ORDER — DEXTROSE 50 % IV SOLN: 0.0000 mL | INTRAVENOUS | Status: DC | PRN

## 2020-01-29 MED ORDER — INSULIN ASPART 100 UNIT/ML ~~LOC~~ SOLN
4.0000 [IU] | Freq: Once | SUBCUTANEOUS | Status: AC
  Administered 2020-01-29: 4 [IU] via INTRAVENOUS
  Filled 2020-01-29: qty 1

## 2020-01-29 MED ORDER — IOHEXOL 350 MG/ML SOLN
100.0000 mL | Freq: Once | INTRAVENOUS | Status: AC | PRN
  Administered 2020-01-29: 100 mL via INTRAVENOUS

## 2020-01-29 NOTE — ED Triage Notes (Signed)
Pt arrives from home via [facility] with complaint of AMS/"not feeling well." Pt alert to self only. EMS info per daughter. History of diabetes Treated for respiratory infection with prednisone  500 mL NS en route  RA sat initially 90% 98% 2L [place] HR 115 cbg 365

## 2020-01-29 NOTE — ED Notes (Signed)
Pt transported to CT ?

## 2020-01-29 NOTE — ED Provider Notes (Signed)
[facility] Emergency Department Provider Note   ____________________________________________   First MD Initiated Contact with Patient 01/05/2020 2054     (approximate)  I have reviewed the triage vital signs and the nursing notes.   HISTORY  Chief Complaint Altered Mental Status Patient somewhat confused.  She is not answering all my questions reliably.  She cannot answer complex questions like what happened to you or what medicines you take.   HPI Marissa Cook is a 54 y.o. female comes in via EMS complaining of not feeling well.  She has with her instructions from an outpatient visit from the 26th for a cough and Covid testing and they started on Zithromax and prednisone.  Patient is now taking deep rapid respirations.  She smells ketotic and she is somewhat confused.  She is not answering questions well and cannot tell me when this started or what her medicines are.  She does not seem to know what day today is.        Past Medical History:  Diagnosis Date  . Anxiety   . DDD (degenerative disc disease), lumbar   . Depression   . Diabetes mellitus without complication ([place])   . Hyperlipidemia   . Hypothyroidism   . Pancreatitis     Patient Active Problem List   Diagnosis Date Noted  . Spondylolisthesis, lumbar region 02/21/2019    Past Surgical History:  Procedure Laterality Date  . ABDOMINAL HYSTERECTOMY    . ANTERIOR LAT LUMBAR FUSION N/A 02/21/2019   Procedure: ANTERIOR LATERAL LUMBAR INTERBODY FUSION, LATERAL INSTRUMENTATION LUMBAR FOUR- LUMBAR FIVE;  Surgeon: Newman Pies, MD;  Location: [place];  Service: Neurosurgery;  Laterality: N/A;  anterolateral  . COLONOSCOPY N/A 01/15/2016   Procedure: COLONOSCOPY;  Surgeon: Manya Silvas, MD;  Location: [facility] ENDOSCOPY;  Service: Endoscopy;  Laterality: N/A;  . CYSTECTOMY    . ESOPHAGOGASTRODUODENOSCOPY (EGD) WITH PROPOFOL N/A 01/15/2016   Procedure: ESOPHAGOGASTRODUODENOSCOPY (EGD)  WITH PROPOFOL;  Surgeon: Manya Silvas, MD;  Location: [facility] ENDOSCOPY;  Service: Endoscopy;  Laterality: N/A;    Prior to Admission medications   Medication Sig Start Date End Date Taking? Authorizing Provider  aspirin EC 81 MG tablet Take 81 mg by mouth daily. Swallow whole.   Yes [provider]  atorvastatin (LIPITOR) 80 MG tablet Take 1 tablet by mouth daily. 01/17/20 01/16/21 Yes [provider]  DULoxetine (CYMBALTA) 30 MG capsule Take 30 mg by mouth daily. 01/21/2020  Yes [provider]  empagliflozin (JARDIANCE) 25 MG TABS tablet Take 1 tablet by mouth daily. 01/17/20  Yes [provider]  gabapentin (NEURONTIN) 600 MG tablet Take 600 mg by mouth 3 (three) times daily. 01/11/20  Yes [provider]  insulin aspart (NOVOLOG) 100 UNIT/ML injection Inject 8 Units into the skin 3 (three) times daily before meals. Plus sliding scale, MAX 80 units daily.   Yes [provider]  insulin degludec (TRESIBA FLEXTOUCH) 100 UNIT/ML SOPN FlexTouch Pen Inject 45 Units into the skin at bedtime.    Yes [provider]  levothyroxine (SYNTHROID) 300 MCG tablet Take 300 mcg by mouth daily before breakfast.    Yes [provider]  linaclotide (LINZESS) 145 MCG CAPS capsule Take 1 capsule by mouth daily. 01/17/20  Yes [provider]  metFORMIN (GLUCOPHAGE-XR) 500 MG 24 hr tablet Take 2 tablets by mouth in the morning and at bedtime. 01/17/20 February 18, 2020 Yes [provider]  metoprolol tartrate (LOPRESSOR) 50 MG tablet Take 1 tablet by  mouth in the morning and at bedtime. 01/17/20  Yes [provider]  QUEtiapine (SEROQUEL) 50 MG tablet Take 100 mg by mouth at bedtime. 01/11/2020  Yes [provider]  cyclobenzaprine (FLEXERIL) 10 MG tablet Take 1 tablet (10 mg total) by mouth 3 (three) times daily as needed for muscle spasms. Patient not taking: Reported on 01/02/2020 02/22/19   Newman Pies, MD  docusate sodium  (COLACE) 100 MG capsule Take 1 capsule (100 mg total) by mouth 2 (two) times daily. Patient not taking: Reported on 01/04/2020 02/22/19   Newman Pies, MD  oxyCODONE (OXY IR/ROXICODONE) 5 MG immediate release tablet Take 1 tablet (5 mg total) by mouth every 4 (four) hours as needed for moderate pain ((score 4 to 6)). Patient not taking: Reported on 01/26/2020 02/22/19   Newman Pies, MD    Allergies Sulfasalazine  History reviewed. No pertinent family history.  Social History Social History   Tobacco Use  . Smoking status: Never Smoker  . Smokeless tobacco: Never Used  Substance Use Topics  . Alcohol use: No  . Drug use: No    Review of Systems  Unable to obtain due to confusion and patient not answering questions ____________________________________________   PHYSICAL EXAM:  VITAL SIGNS: ED Triage Vitals  Enc Vitals Group     BP 01/13/2020 2031 (!) 147/99     Pulse Rate 01/09/2020 2031 (!) 105     Resp 01/16/2020 2031 (!) 40     Temp 01/16/2020 2031 97.7 F (36.5 C)     Temp Source 01/08/2020 2031 Axillary     SpO2 01/05/2020 2030 99 %     Weight 01/18/2020 2043 160 lb (72.6 kg)     Height 01/20/2020 2043 5\' 3"  (1.6 m)     Head Circumference --      Peak Flow --      Pain Score --      Pain Loc --      Pain Edu? --      Excl. in [place]? --     Constitutional: Alert but confused and restless Eyes: Conjunctivae are normal. PERRL. EOMI. Head: Atraumatic. Nose: No congestion/rhinnorhea. Mouth/Throat: Mucous membranes are moist.  Oropharynx non-erythematous. Neck: No stridor.   Cardiovascular: Rapid rate, regular rhythm. Grossly normal heart sounds.  Good peripheral circulation. Respiratory: Increased respiratory effort.  No retractions. Lungs CTAB. Gastrointestinal: Soft and nontender. No distention. No abdominal bruits.  Musculoskeletal: No lower extremity tenderness nor edema.  . Neurologic:  Normal speech and language. No gross focal neurologic deficits are appreciated.  . Skin:  Skin is warm, dry and intact. No rash noted.   ____________________________________________   LABS (all labs ordered are listed, but only abnormal results are displayed)  Labs Reviewed  RESPIRATORY PANEL BY RT PCR (FLU A&B, COVID) - Abnormal; Notable for the following components:      Result Value   SARS Coronavirus 2 by RT PCR POSITIVE (*)    All other components within normal limits  COMPREHENSIVE METABOLIC PANEL - Abnormal; Notable for the following components:   Sodium 132 (*)    CO2 11 (*)    Glucose, Bld 507 (*)    BUN 28 (*)    Creatinine, Ser 1.19 (*)    Calcium 8.3 (*)    AST 68 (*)    Total Bilirubin 1.8 (*)    GFR calc non Af Amer 53 (*)    Anion gap 18 (*)    All other components within normal limits  LACTIC  ACID, PLASMA - Abnormal; Notable for the following components:   Lactic Acid, Venous 2.7 (*)    All other components within normal limits  CBC WITH DIFFERENTIAL/PLATELET - Abnormal; Notable for the following components:   WBC 12.7 (*)    RBC 5.19 (*)    Hemoglobin 15.8 (*)    HCT 47.2 (*)    Neutro Abs 9.9 (*)    Abs Immature Granulocytes 0.22 (*)    All other components within normal limits  FIBRIN DERIVATIVES D-DIMER ([facility] ONLY) - Abnormal; Notable for the following components:   Fibrin derivatives D-dimer ([facility]) 3,140.70 (*)    All other components within normal limits  ACETAMINOPHEN LEVEL - Abnormal; Notable for the following components:   Acetaminophen (Tylenol), Serum <10 (*)    All other components within normal limits  SALICYLATE LEVEL - Abnormal; Notable for the following components:   Salicylate Lvl <2.6 (*)    All other components within normal limits  BLOOD GAS, ARTERIAL - Abnormal; Notable for the following components:   pH, Arterial 7.08 (*)    pCO2 arterial <19.0 (*)    pO2, Arterial 75 (*)    Bicarbonate 4.2 (*)    Acid-base deficit 23.6 (*)    All other components within normal limits  GLUCOSE, CAPILLARY - Abnormal;  Notable for the following components:   Glucose-Capillary 474 (*)    All other components within normal limits  BETA-HYDROXYBUTYRIC ACID - Abnormal; Notable for the following components:   Beta-Hydroxybutyric Acid 7.42 (*)    All other components within normal limits  GLUCOSE, CAPILLARY - Abnormal; Notable for the following components:   Glucose-Capillary 413 (*)    All other components within normal limits  BRAIN NATRIURETIC PEPTIDE  PROCALCITONIN  BLOOD GAS, VENOUS  LACTIC ACID, PLASMA  PREGNANCY, URINE  URINALYSIS, COMPLETE (UACMP) WITH MICROSCOPIC  URINE DRUG SCREEN, QUALITATIVE ([facility] ONLY)  BASIC METABOLIC PANEL  CBG MONITORING, ED  TROPONIN I (HIGH SENSITIVITY)  TROPONIN I (HIGH SENSITIVITY)   ____________________________________________  EKG   ____________________________________________  RADIOLOGY  ED MD interpretation: CT of the head read by radiology reviewed by me shows no acute findings.  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  CT of the chest with dye does not show any PEs.  There is what appears to be Covid pneumonia.  Chest x-ray read by radiology reviewed by me shows bilateral multifocal pneumonia.  Official radiology report(s): CT Head Wo Contrast  Result Date: 01/20/2020 CLINICAL DATA:  Mental status change, feeling unwell, recently treated for respiratory infection with prednisone EXAM: CT HEAD WITHOUT CONTRAST TECHNIQUE: Contiguous axial images were obtained from the base of the skull through the vertex without intravenous contrast. COMPARISON:  MRI 06/20/2019 FINDINGS: Brain: Hypoattenuating focus in the right basal ganglia compatible with remote lacunar infarct versus prominent perivascular space as detailed on comparison MR. No evidence of acute infarction, hemorrhage, hydrocephalus, extra-axial collection, visible mass lesion or mass effect. Vascular: No hyperdense vessel or unexpected calcification. Skull: No calvarial fracture or suspicious  osseous lesion. No scalp swelling or hematoma. Sinuses/Orbits: Paranasal sinuses and mastoid air cells are predominantly clear. Included orbital structures are unremarkable. Other: None. IMPRESSION: 1. No acute intracranial findings. 2. Hypoattenuating focus in the right basal ganglia compatible with remote lacunar infarct versus prominent perivascular space as detailed on comparison. Electronically Signed   By: Lovena Le M.D.   On: 01/19/2020 21:56   CT Angio Chest PE W and/or Wo Contrast  Result Date: 01/28/2020 CLINICAL DATA:  Tachycardia and  tachypnea EXAM: CT ANGIOGRAPHY CHEST WITH CONTRAST TECHNIQUE: Multidetector CT imaging of the chest was performed using the standard protocol during bolus administration of intravenous contrast. Multiplanar CT image reconstructions and MIPs were obtained to evaluate the vascular anatomy. CONTRAST:  143mL OMNIPAQUE IOHEXOL 350 MG/ML SOLN COMPARISON:  None. FINDINGS: Cardiovascular: Contrast injection is sufficient to demonstrate satisfactory opacification of the pulmonary arteries to the segmental level. There is no pulmonary embolus or evidence of right heart strain. The size of the main pulmonary artery is normal. Heart size is normal, with no pericardial effusion. The course and caliber of the aorta are normal. There is no atherosclerotic calcification. Opacification decreased due to pulmonary arterial phase contrast bolus timing. Mediastinum/Nodes: No mediastinal, hilar or axillary lymphadenopathy. Normal visualized thyroid. Thoracic esophageal course is normal. Lungs/Pleura: Multifocal, peripheral predominant ground glass opacities. No pleural effusion. Upper Abdomen: Contrast bolus timing is not optimized for evaluation of the abdominal organs. The visualized portions of the organs of the upper abdomen are normal. Musculoskeletal: No chest wall abnormality. No bony spinal canal stenosis. Review of the MIP images confirms the above findings. IMPRESSION: 1. No  pulmonary embolus or acute aortic syndrome. 2. Multifocal, peripheral predominant ground glass opacities, most consistent with multifocal pneumonia, including viral pneumonia. Electronically Signed   By: Ulyses Jarred M.D.   On: 01/02/2020 23:53   DG Chest Portable 1 View  Result Date: 01/27/2020 CLINICAL DATA:  Shortness of breath and weakness. EXAM: PORTABLE CHEST 1 VIEW COMPARISON:  Report from radiograph 09/12/2018, images not available. FINDINGS: Lung volumes are low. The heart is normal in size. Patient is rotated. Patchy heterogeneous bilateral airspace opacities. No pneumothorax or large pleural effusion. No acute osseous abnormalities are seen. IMPRESSION: Low lung volumes with patchy heterogeneous bilateral airspace opacities. Findings may represent pulmonary edema or multifocal pneumonia, including COVID. Electronically Signed   By: Keith Rake M.D.   On: 01/26/2020 21:47    ____________________________________________   PROCEDURES  Procedure(s) performed (including Critical Care): Critical care time 30 minutes.  This includes talking to the patient and reviewing her old records and reviewing her labs and x-rays and CT scans.  Also discussing her with the hospitalist.  Procedures   ____________________________________________   INITIAL IMPRESSION / ASSESSMENT AND PLAN / ED COURSE   Patient is still tachypneic and tachycardic.  I will give her some more fluids.  We will get a get another BMP to check on her potassium.  Her sugar is coming down somewhat.  She does have DKA with a good bit of acidosis and positive beta hydroxybutyric acid.  She also has Covid.  She is still confused.  I am not sure why.  She still acidotic as well.  She may be still dehydrated.  I will try to give her a little bit more fluid carefully.  There really has been no change in her confusion since she arrived.             ____________________________________________   FINAL CLINICAL  IMPRESSION(S) / ED DIAGNOSES  Final diagnoses:  Altered mental status, unspecified altered mental status type  Diabetic ketoacidosis without coma associated with diabetes mellitus due to underlying condition ([place])  COVID-19     ED Discharge Orders    None      *Please note:  Marissa Cook was evaluated in Emergency Department on 01/30/2020 for the symptoms described in the history of present illness. She was evaluated in the context of the global COVID-19 pandemic, which necessitated consideration that the  patient might be at risk for infection with the SARS-CoV-2 virus that causes COVID-19. Institutional protocols and algorithms that pertain to the evaluation of patients at risk for COVID-19 are in a state of rapid change based on information released by regulatory bodies including the CDC and federal and state organizations. These policies and algorithms were followed during the patient's care in the ED.  Some ED evaluations and interventions may be delayed as a result of limited staffing during and the pandemic.*   Note:  This document was prepared using Dragon voice recognition software and may include unintentional dictation errors.    Nena Polio, MD 01/30/20 [phone]

## 2020-01-30 ENCOUNTER — Inpatient Hospital Stay: Payer: BC Managed Care – PPO

## 2020-01-30 DIAGNOSIS — U071 COVID-19: Secondary | ICD-10-CM

## 2020-01-30 DIAGNOSIS — F329 Major depressive disorder, single episode, unspecified: Secondary | ICD-10-CM | POA: Diagnosis present

## 2020-01-30 DIAGNOSIS — A4189 Other specified sepsis: Secondary | ICD-10-CM | POA: Diagnosis not present

## 2020-01-30 DIAGNOSIS — R4182 Altered mental status, unspecified: Secondary | ICD-10-CM | POA: Diagnosis not present

## 2020-01-30 DIAGNOSIS — E039 Hypothyroidism, unspecified: Secondary | ICD-10-CM | POA: Diagnosis present

## 2020-01-30 DIAGNOSIS — E785 Hyperlipidemia, unspecified: Secondary | ICD-10-CM | POA: Diagnosis present

## 2020-01-30 DIAGNOSIS — N179 Acute kidney failure, unspecified: Secondary | ICD-10-CM | POA: Diagnosis not present

## 2020-01-30 DIAGNOSIS — R578 Other shock: Secondary | ICD-10-CM | POA: Diagnosis present

## 2020-01-30 DIAGNOSIS — Z6841 Body Mass Index (BMI) 40.0 and over, adult: Secondary | ICD-10-CM | POA: Diagnosis not present

## 2020-01-30 DIAGNOSIS — Z515 Encounter for palliative care: Secondary | ICD-10-CM | POA: Diagnosis not present

## 2020-01-30 DIAGNOSIS — G9341 Metabolic encephalopathy: Secondary | ICD-10-CM | POA: Diagnosis present

## 2020-01-30 DIAGNOSIS — G928 Other toxic encephalopathy: Secondary | ICD-10-CM | POA: Diagnosis not present

## 2020-01-30 DIAGNOSIS — D649 Anemia, unspecified: Secondary | ICD-10-CM | POA: Diagnosis present

## 2020-01-30 DIAGNOSIS — I471 Supraventricular tachycardia: Secondary | ICD-10-CM | POA: Diagnosis not present

## 2020-01-30 DIAGNOSIS — F32A Depression, unspecified: Secondary | ICD-10-CM | POA: Diagnosis present

## 2020-01-30 DIAGNOSIS — J1282 Pneumonia due to coronavirus disease 2019: Secondary | ICD-10-CM | POA: Diagnosis present

## 2020-01-30 DIAGNOSIS — E0811 Diabetes mellitus due to underlying condition with ketoacidosis with coma: Secondary | ICD-10-CM | POA: Diagnosis not present

## 2020-01-30 DIAGNOSIS — R6521 Severe sepsis with septic shock: Secondary | ICD-10-CM | POA: Diagnosis not present

## 2020-01-30 DIAGNOSIS — R601 Generalized edema: Secondary | ICD-10-CM | POA: Diagnosis present

## 2020-01-30 DIAGNOSIS — Z66 Do not resuscitate: Secondary | ICD-10-CM | POA: Diagnosis not present

## 2020-01-30 DIAGNOSIS — M5136 Other intervertebral disc degeneration, lumbar region: Secondary | ICD-10-CM | POA: Diagnosis present

## 2020-01-30 DIAGNOSIS — J9600 Acute respiratory failure, unspecified whether with hypoxia or hypercapnia: Secondary | ICD-10-CM | POA: Diagnosis not present

## 2020-01-30 DIAGNOSIS — J189 Pneumonia, unspecified organism: Secondary | ICD-10-CM | POA: Diagnosis not present

## 2020-01-30 DIAGNOSIS — J80 Acute respiratory distress syndrome: Secondary | ICD-10-CM | POA: Diagnosis present

## 2020-01-30 DIAGNOSIS — J9601 Acute respiratory failure with hypoxia: Secondary | ICD-10-CM | POA: Diagnosis not present

## 2020-01-30 DIAGNOSIS — E1110 Type 2 diabetes mellitus with ketoacidosis without coma: Secondary | ICD-10-CM | POA: Diagnosis present

## 2020-01-30 DIAGNOSIS — K760 Fatty (change of) liver, not elsewhere classified: Secondary | ICD-10-CM | POA: Diagnosis present

## 2020-01-30 DIAGNOSIS — Z7189 Other specified counseling: Secondary | ICD-10-CM | POA: Diagnosis not present

## 2020-01-30 DIAGNOSIS — E1311 Other specified diabetes mellitus with ketoacidosis with coma: Secondary | ICD-10-CM | POA: Diagnosis not present

## 2020-01-30 DIAGNOSIS — R570 Cardiogenic shock: Secondary | ICD-10-CM | POA: Diagnosis present

## 2020-01-30 DIAGNOSIS — I10 Essential (primary) hypertension: Secondary | ICD-10-CM | POA: Diagnosis present

## 2020-01-30 DIAGNOSIS — Z9911 Dependence on respirator [ventilator] status: Secondary | ICD-10-CM | POA: Diagnosis not present

## 2020-01-30 DIAGNOSIS — L8990 Pressure ulcer of unspecified site, unspecified stage: Secondary | ICD-10-CM | POA: Insufficient documentation

## 2020-01-30 LAB — BASIC METABOLIC PANEL
Anion gap: 11 (ref 5–15)
Anion gap: 14 (ref 5–15)
Anion gap: 13 (ref 5–15)
Anion gap: 6 (ref 5–15)
Anion gap: 12 (ref 5–15)
BUN: 24 mg/dL — ABNORMAL HIGH (ref 6–20)
BUN: 24 mg/dL — ABNORMAL HIGH (ref 6–20)
BUN: 25 mg/dL — ABNORMAL HIGH (ref 6–20)
BUN: 23 mg/dL — ABNORMAL HIGH (ref 6–20)
BUN: 21 mg/dL — ABNORMAL HIGH (ref 6–20)
BUN: 30 mg/dL — ABNORMAL HIGH (ref 6–20)
CO2: <7 mmol/L — ABNORMAL LOW (ref 22–32)
CO2: 13 mmol/L — ABNORMAL LOW (ref 22–32)
CO2: 12 mmol/L — ABNORMAL LOW (ref 22–32)
CO2: 14 mmol/L — ABNORMAL LOW (ref 22–32)
CO2: 16 mmol/L — ABNORMAL LOW (ref 22–32)
CO2: 11 mmol/L — ABNORMAL LOW (ref 22–32)
Calcium: 6.6 mg/dL — ABNORMAL LOW (ref 8.9–10.3)
Calcium: 7.2 mg/dL — ABNORMAL LOW (ref 8.9–10.3)
Calcium: 7.8 mg/dL — ABNORMAL LOW (ref 8.9–10.3)
Calcium: 7.9 mg/dL — ABNORMAL LOW (ref 8.9–10.3)
Calcium: 6.7 mg/dL — ABNORMAL LOW (ref 8.9–10.3)
Calcium: 7.2 mg/dL — ABNORMAL LOW (ref 8.9–10.3)
Chloride: 111 mmol/L (ref 98–111)
Chloride: 114 mmol/L — ABNORMAL HIGH (ref 98–111)
Chloride: 110 mmol/L (ref 98–111)
Chloride: 110 mmol/L (ref 98–111)
Chloride: 119 mmol/L — ABNORMAL HIGH (ref 98–111)
Chloride: 113 mmol/L — ABNORMAL HIGH (ref 98–111)
Creatinine, Ser: 1.13 mg/dL — ABNORMAL HIGH (ref 0.44–1.00)
Creatinine, Ser: 0.93 mg/dL (ref 0.44–1.00)
Creatinine, Ser: 0.98 mg/dL (ref 0.44–1.00)
Creatinine, Ser: 0.85 mg/dL (ref 0.44–1.00)
Creatinine, Ser: 0.52 mg/dL (ref 0.44–1.00)
Creatinine, Ser: 0.92 mg/dL (ref 0.44–1.00)
GFR calc Af Amer: >60 mL/min (ref >60)
GFR calc Af Amer: >60 mL/min (ref >60)
GFR calc Af Amer: >60 mL/min (ref >60)
GFR calc Af Amer: >60 mL/min (ref >60)
GFR calc Af Amer: >60 mL/min (ref >60)
GFR calc Af Amer: >60 mL/min (ref >60)
GFR calc non Af Amer: 56 mL/min — ABNORMAL LOW (ref >60)
GFR calc non Af Amer: >60 mL/min (ref >60)
GFR calc non Af Amer: >60 mL/min (ref >60)
GFR calc non Af Amer: >60 mL/min (ref >60)
GFR calc non Af Amer: >60 mL/min (ref >60)
GFR calc non Af Amer: >60 mL/min (ref >60)
Glucose, Bld: 405 mg/dL — ABNORMAL HIGH (ref 70–99)
Glucose, Bld: 255 mg/dL — ABNORMAL HIGH (ref 70–99)
Glucose, Bld: 241 mg/dL — ABNORMAL HIGH (ref 70–99)
Glucose, Bld: 269 mg/dL — ABNORMAL HIGH (ref 70–99)
Glucose, Bld: 137 mg/dL — ABNORMAL HIGH (ref 70–99)
Glucose, Bld: 329 mg/dL — ABNORMAL HIGH (ref 70–99)
Potassium: 4.3 mmol/L (ref 3.5–5.1)
Potassium: 3.8 mmol/L (ref 3.5–5.1)
Potassium: 4.1 mmol/L (ref 3.5–5.1)
Potassium: 3.4 mmol/L — ABNORMAL LOW (ref 3.5–5.1)
Potassium: 2.7 mmol/L — CL (ref 3.5–5.1)
Potassium: 4.6 mmol/L (ref 3.5–5.1)
Sodium: 134 mmol/L — ABNORMAL LOW (ref 135–145)
Sodium: 138 mmol/L (ref 135–145)
Sodium: 136 mmol/L (ref 135–145)
Sodium: 137 mmol/L (ref 135–145)
Sodium: 141 mmol/L (ref 135–145)
Sodium: 136 mmol/L (ref 135–145)

## 2020-01-30 LAB — LIPASE, BLOOD: Lipase: 743 U/L — ABNORMAL HIGH (ref 11–51)

## 2020-01-30 LAB — URINE DRUG SCREEN, QUALITATIVE (ARMC ONLY)
Amphetamines, Ur Screen: NOT DETECTED
Barbiturates, Ur Screen: NOT DETECTED
Benzodiazepine, Ur Scrn: NOT DETECTED
Cannabinoid 50 Ng, Ur ~~LOC~~: NOT DETECTED
Cocaine Metabolite,Ur ~~LOC~~: NOT DETECTED
MDMA (Ecstasy)Ur Screen: NOT DETECTED
Methadone Scn, Ur: NOT DETECTED
Opiate, Ur Screen: NOT DETECTED
Phencyclidine (PCP) Ur S: NOT DETECTED
Tricyclic, Ur Screen: NOT DETECTED

## 2020-01-30 LAB — GLUCOSE, CAPILLARY
Glucose-Capillary: 139 mg/dL — ABNORMAL HIGH (ref 70–99)
Glucose-Capillary: 159 mg/dL — ABNORMAL HIGH (ref 70–99)
Glucose-Capillary: 185 mg/dL — ABNORMAL HIGH (ref 70–99)
Glucose-Capillary: 213 mg/dL — ABNORMAL HIGH (ref 70–99)
Glucose-Capillary: 221 mg/dL — ABNORMAL HIGH (ref 70–99)
Glucose-Capillary: 233 mg/dL — ABNORMAL HIGH (ref 70–99)
Glucose-Capillary: 234 mg/dL — ABNORMAL HIGH (ref 70–99)
Glucose-Capillary: 246 mg/dL — ABNORMAL HIGH (ref 70–99)
Glucose-Capillary: 254 mg/dL — ABNORMAL HIGH (ref 70–99)
Glucose-Capillary: 280 mg/dL — ABNORMAL HIGH (ref 70–99)
Glucose-Capillary: 304 mg/dL — ABNORMAL HIGH (ref 70–99)
Glucose-Capillary: 319 mg/dL — ABNORMAL HIGH (ref 70–99)
Glucose-Capillary: 366 mg/dL — ABNORMAL HIGH (ref 70–99)
Glucose-Capillary: 386 mg/dL — ABNORMAL HIGH (ref 70–99)
Glucose-Capillary: 425 mg/dL — ABNORMAL HIGH (ref 70–99)
Glucose-Capillary: 443 mg/dL — ABNORMAL HIGH (ref 70–99)
Glucose-Capillary: 447 mg/dL — ABNORMAL HIGH (ref 70–99)
Glucose-Capillary: 487 mg/dL — ABNORMAL HIGH (ref 70–99)

## 2020-01-30 LAB — BLOOD GAS, ARTERIAL
Acid-base deficit: 12 mmol/L — ABNORMAL HIGH (ref 0.0–2.0)
Acid-base deficit: 11.8 mmol/L — ABNORMAL HIGH (ref 0.0–2.0)
Bicarbonate: 11.6 mmol/L — ABNORMAL LOW (ref 20.0–28.0)
Bicarbonate: 14.7 mmol/L — ABNORMAL LOW (ref 20.0–28.0)
FIO2: 1
FIO2: 0.7
MECHVT: 450 mL
O2 Saturation: 86.9 %
O2 Saturation: 93.8 %
PEEP: 8 cmH2O
Patient temperature: 37
Patient temperature: 37
RATE: 18 resp/min
pCO2 arterial: 22 mmHg — ABNORMAL LOW (ref 32.0–48.0)
pCO2 arterial: 35 mmHg (ref 32.0–48.0)
pH, Arterial: 7.33 — ABNORMAL LOW (ref 7.350–7.450)
pH, Arterial: 7.23 — ABNORMAL LOW (ref 7.350–7.450)
pO2, Arterial: 57 mmHg — ABNORMAL LOW (ref 83.0–108.0)
pO2, Arterial: 83 mmHg (ref 83.0–108.0)

## 2020-01-30 LAB — URINALYSIS, COMPLETE (UACMP) WITH MICROSCOPIC
Bacteria, UA: NONE SEEN
Bilirubin Urine: NEGATIVE
Glucose, UA: >=500 mg/dL — AB
Ketones, ur: 80 mg/dL — AB
Leukocytes,Ua: NEGATIVE
Nitrite: NEGATIVE
Protein, ur: 30 mg/dL — AB
Specific Gravity, Urine: 1.026 (ref 1.005–1.030)
pH: 5 (ref 5.0–8.0)

## 2020-01-30 LAB — FERRITIN: Ferritin: 393 ng/mL — ABNORMAL HIGH (ref 11–307)

## 2020-01-30 LAB — CBC WITH DIFFERENTIAL/PLATELET
Abs Immature Granulocytes: 0.15 10*3/uL — ABNORMAL HIGH (ref 0.00–0.07)
Basophils Absolute: 0 10*3/uL (ref 0.0–0.1)
Basophils Relative: 0 %
Eosinophils Absolute: 0 10*3/uL (ref 0.0–0.5)
Eosinophils Relative: 0 %
HCT: 43.7 % (ref 36.0–46.0)
Hemoglobin: 15.2 g/dL — ABNORMAL HIGH (ref 12.0–15.0)
Immature Granulocytes: 1 %
Lymphocytes Relative: 9 %
Lymphs Abs: 1 10*3/uL (ref 0.7–4.0)
MCH: 30.8 pg (ref 26.0–34.0)
MCHC: 34.8 g/dL (ref 30.0–36.0)
MCV: 88.6 fL (ref 80.0–100.0)
Monocytes Absolute: 0.4 10*3/uL (ref 0.1–1.0)
Monocytes Relative: 4 %
Neutro Abs: 9.6 10*3/uL — ABNORMAL HIGH (ref 1.7–7.7)
Neutrophils Relative %: 86 %
Platelets: 198 10*3/uL (ref 150–400)
RBC: 4.93 MIL/uL (ref 3.87–5.11)
RDW: 14.3 % (ref 11.5–15.5)
WBC: 11.1 10*3/uL — ABNORMAL HIGH (ref 4.0–10.5)
nRBC: 0 % (ref 0.0–0.2)

## 2020-01-30 LAB — PROCALCITONIN: Procalcitonin: 0.22 ng/mL

## 2020-01-30 LAB — PHOSPHORUS
Phosphorus: 1.3 mg/dL — ABNORMAL LOW (ref 2.5–4.6)
Phosphorus: 1.2 mg/dL — ABNORMAL LOW (ref 2.5–4.6)

## 2020-01-30 LAB — BLOOD GAS, VENOUS
Acid-base deficit: 19.1 mmol/L — ABNORMAL HIGH (ref 0.0–2.0)
Acid-base deficit: 16.8 mmol/L — ABNORMAL HIGH (ref 0.0–2.0)
Bicarbonate: 9.3 mmol/L — ABNORMAL LOW (ref 20.0–28.0)
Bicarbonate: 9.5 mmol/L — ABNORMAL LOW (ref 20.0–28.0)
O2 Saturation: 22.6 %
O2 Saturation: 73.1 %
Patient temperature: 37
Patient temperature: 37
pCO2, Ven: 30 mmHg — ABNORMAL LOW (ref 44.0–60.0)
pCO2, Ven: 25 mmHg — ABNORMAL LOW (ref 44.0–60.0)
pH, Ven: 7.1 — CL (ref 7.250–7.430)
pH, Ven: 7.19 — CL (ref 7.250–7.430)
pO2, Ven: <31 mmHg — CL (ref 32.0–45.0)
pO2, Ven: 49 mmHg — ABNORMAL HIGH (ref 32.0–45.0)

## 2020-01-30 LAB — C-REACTIVE PROTEIN: CRP: 10.1 mg/dL — ABNORMAL HIGH (ref <1.0)

## 2020-01-30 LAB — ETHANOL: Alcohol, Ethyl (B): <10 mg/dL (ref <10)

## 2020-01-30 LAB — HIV ANTIBODY (ROUTINE TESTING W REFLEX): HIV Screen 4th Generation wRfx: NONREACTIVE

## 2020-01-30 LAB — MRSA PCR SCREENING: MRSA by PCR: NEGATIVE

## 2020-01-30 LAB — MAGNESIUM
Magnesium: 2.1 mg/dL (ref 1.7–2.4)
Magnesium: 2 mg/dL (ref 1.7–2.4)

## 2020-01-30 LAB — TROPONIN I (HIGH SENSITIVITY): Troponin I (High Sensitivity): 12 ng/L (ref <18)

## 2020-01-30 LAB — PREGNANCY, URINE: Preg Test, Ur: NEGATIVE

## 2020-01-30 LAB — BETA-HYDROXYBUTYRIC ACID: Beta-Hydroxybutyric Acid: 3.91 mmol/L — ABNORMAL HIGH (ref 0.05–0.27)

## 2020-01-30 LAB — LACTIC ACID, PLASMA: Lactic Acid, Venous: 2.3 mmol/L (ref 0.5–1.9)

## 2020-01-30 LAB — LACTATE DEHYDROGENASE: LDH: 568 U/L — ABNORMAL HIGH (ref 98–192)

## 2020-01-30 LAB — TSH: TSH: 21.025 u[IU]/mL — ABNORMAL HIGH (ref 0.350–4.500)

## 2020-01-30 LAB — BRAIN NATRIURETIC PEPTIDE: B Natriuretic Peptide: 89.8 pg/mL (ref 0.0–100.0)

## 2020-01-30 MED ORDER — INSULIN ASPART 100 UNIT/ML ~~LOC~~ SOLN
0.0000 [IU] | Freq: Three times a day (TID) | SUBCUTANEOUS | Status: DC
  Administered 2020-01-30: 2 [IU] via SUBCUTANEOUS
  Filled 2020-01-30: qty 1

## 2020-01-30 MED ORDER — HYDROCOD POLST-CPM POLST ER 10-8 MG/5ML PO SUER: 5.0000 mL | Freq: Two times a day (BID) | ORAL | Status: DC | PRN

## 2020-01-30 MED ORDER — DOCUSATE SODIUM 100 MG PO CAPS: 100.0000 mg | ORAL_CAPSULE | Freq: Two times a day (BID) | ORAL | Status: DC | PRN

## 2020-01-30 MED ORDER — ONDANSETRON HCL 4 MG PO TABS: 4.0000 mg | ORAL_TABLET | Freq: Four times a day (QID) | ORAL | Status: DC | PRN

## 2020-01-30 MED ORDER — DEXTROSE IN LACTATED RINGERS 5 % IV SOLN: INTRAVENOUS | Status: DC

## 2020-01-30 MED ORDER — FAMOTIDINE 20 MG PO TABS: 20.0000 mg | ORAL_TABLET | Freq: Two times a day (BID) | ORAL | Status: DC

## 2020-01-30 MED ORDER — INSULIN GLARGINE 100 UNIT/ML ~~LOC~~ SOLN
20.0000 [IU] | Freq: Two times a day (BID) | SUBCUTANEOUS | Status: DC
  Administered 2020-01-30: 20 [IU] via SUBCUTANEOUS
  Filled 2020-01-30 (×2): qty 0.2

## 2020-01-30 MED ORDER — FAMOTIDINE IN NACL 20-0.9 MG/50ML-% IV SOLN
20.0000 mg | Freq: Two times a day (BID) | INTRAVENOUS | Status: DC
  Administered 2020-01-30 – 2020-02-16 (×34): 20 mg via INTRAVENOUS
  Filled 2020-01-30 (×34): qty 50

## 2020-01-30 MED ORDER — DEXTROSE 50 % IV SOLN: 0.0000 mL | INTRAVENOUS | Status: DC | PRN

## 2020-01-30 MED ORDER — SODIUM CHLORIDE 0.9 % IV SOLN
100.0000 mg | Freq: Every day | INTRAVENOUS | Status: AC
  Administered 2020-01-31 – 2020-02-03 (×4): 100 mg via INTRAVENOUS
  Filled 2020-01-30 (×3): qty 20
  Filled 2020-01-30 (×3): qty 100

## 2020-01-30 MED ORDER — DEXAMETHASONE SODIUM PHOSPHATE 10 MG/ML IJ SOLN
6.0000 mg | Freq: Once | INTRAMUSCULAR | Status: AC
  Administered 2020-01-30: 6 mg via INTRAVENOUS
  Filled 2020-01-30: qty 1

## 2020-01-30 MED ORDER — SODIUM CHLORIDE 0.9 % IV SOLN: INTRAVENOUS | Status: DC

## 2020-01-30 MED ORDER — ORAL CARE MOUTH RINSE
15.0000 mL | OROMUCOSAL | Status: DC
  Administered 2020-01-30 – 2020-02-01 (×16): 15 mL via OROMUCOSAL

## 2020-01-30 MED ORDER — DEXTROSE-NACL 5-0.45 % IV SOLN: INTRAVENOUS | Status: DC

## 2020-01-30 MED ORDER — MIDAZOLAM HCL 2 MG/2ML IJ SOLN
2.0000 mg | Freq: Once | INTRAMUSCULAR | Status: AC
  Administered 2020-01-30: 2 mg via INTRAVENOUS

## 2020-01-30 MED ORDER — POTASSIUM CHLORIDE CRYS ER 20 MEQ PO TBCR: 40.0000 meq | EXTENDED_RELEASE_TABLET | Freq: Once | ORAL | Status: DC

## 2020-01-30 MED ORDER — ACETAMINOPHEN 325 MG PO TABS
650.0000 mg | ORAL_TABLET | Freq: Four times a day (QID) | ORAL | Status: DC | PRN
  Administered 2020-01-30: 650 mg via ORAL
  Filled 2020-01-30: qty 2

## 2020-01-30 MED ORDER — DEXMEDETOMIDINE HCL IN NACL 400 MCG/100ML IV SOLN
0.4000 ug/kg/h | INTRAVENOUS | Status: DC
  Administered 2020-01-30 – 2020-02-01 (×5): 0.8 ug/kg/h via INTRAVENOUS
  Administered 2020-02-01: 1.2 ug/kg/h via INTRAVENOUS
  Administered 2020-02-01 (×2): 0.8 ug/kg/h via INTRAVENOUS
  Administered 2020-02-01: 1 ug/kg/h via INTRAVENOUS
  Administered 2020-02-02 (×3): 1.2 ug/kg/h via INTRAVENOUS
  Filled 2020-01-30 (×12): qty 100

## 2020-01-30 MED ORDER — SODIUM CHLORIDE 0.9 % IV SOLN
200.0000 mg | Freq: Once | INTRAVENOUS | Status: AC
  Administered 2020-01-30: 200 mg via INTRAVENOUS
  Filled 2020-01-30: qty 40

## 2020-01-30 MED ORDER — GUAIFENESIN ER 600 MG PO TB12: 600.0000 mg | ORAL_TABLET | Freq: Two times a day (BID) | ORAL | Status: DC

## 2020-01-30 MED ORDER — VITAMIN D 25 MCG (1000 UNIT) PO TABS
1000.0000 [IU] | ORAL_TABLET | Freq: Every day | ORAL | Status: DC
  Administered 2020-01-31 – 2020-02-16 (×15): 1000 [IU]
  Filled 2020-01-30 (×15): qty 1

## 2020-01-30 MED ORDER — ENOXAPARIN SODIUM 40 MG/0.4ML ~~LOC~~ SOLN
40.0000 mg | SUBCUTANEOUS | Status: DC
  Administered 2020-01-30: 40 mg via SUBCUTANEOUS
  Filled 2020-01-30: qty 0.4

## 2020-01-30 MED ORDER — TRAZODONE HCL 50 MG PO TABS: 25.0000 mg | ORAL_TABLET | Freq: Every evening | ORAL | Status: DC | PRN

## 2020-01-30 MED ORDER — SODIUM BICARBONATE 8.4 % IV SOLN
INTRAVENOUS | Status: DC
  Filled 2020-01-30 (×5): qty 850

## 2020-01-30 MED ORDER — ROCURONIUM BROMIDE 50 MG/5ML IV SOLN
40.0000 mg | Freq: Once | INTRAVENOUS | Status: AC
  Administered 2020-01-30: 40 mg via INTRAVENOUS
  Filled 2020-01-30: qty 4

## 2020-01-30 MED ORDER — SODIUM CHLORIDE 0.9% FLUSH
10.0000 mL | Freq: Two times a day (BID) | INTRAVENOUS | Status: DC
  Administered 2020-01-30 – 2020-02-03 (×8): 10 mL
  Administered 2020-02-03: 20 mL
  Administered 2020-02-04: 40 mL
  Administered 2020-02-04 – 2020-02-09 (×10): 10 mL

## 2020-01-30 MED ORDER — INSULIN DETEMIR 100 UNIT/ML ~~LOC~~ SOLN
14.0000 [IU] | Freq: Every day | SUBCUTANEOUS | Status: DC
  Administered 2020-01-30: 14 [IU] via SUBCUTANEOUS
  Filled 2020-01-30 (×2): qty 0.14

## 2020-01-30 MED ORDER — INSULIN ASPART PROT & ASPART (70-30 MIX) 100 UNIT/ML ~~LOC~~ SUSP: 5.0000 [IU] | Freq: Every day | SUBCUTANEOUS | Status: DC

## 2020-01-30 MED ORDER — FENTANYL 2500MCG IN NS 250ML (10MCG/ML) PREMIX INFUSION
0.0000 ug/h | INTRAVENOUS | Status: DC
  Administered 2020-01-30: 350 ug/h via INTRAVENOUS
  Administered 2020-01-31: 300 ug/h via INTRAVENOUS
  Administered 2020-01-31: 350 ug/h via INTRAVENOUS
  Administered 2020-01-31 – 2020-02-01 (×2): 300 ug/h via INTRAVENOUS
  Filled 2020-01-30 (×4): qty 250

## 2020-01-30 MED ORDER — METHYLPREDNISOLONE SODIUM SUCC 125 MG IJ SOLR
1.0000 mg/kg | Freq: Two times a day (BID) | INTRAMUSCULAR | Status: DC
  Filled 2020-01-30: qty 2

## 2020-01-30 MED ORDER — LORAZEPAM 2 MG/ML IJ SOLN
1.0000 mg | Freq: Once | INTRAMUSCULAR | Status: AC
  Filled 2020-01-30: qty 1

## 2020-01-30 MED ORDER — LORAZEPAM 2 MG/ML IJ SOLN: 1.0000 mg | Freq: Once | INTRAMUSCULAR | Status: DC

## 2020-01-30 MED ORDER — INSULIN ASPART 100 UNIT/ML ~~LOC~~ SOLN
0.0000 [IU] | SUBCUTANEOUS | Status: DC
  Administered 2020-01-30: 11 [IU] via SUBCUTANEOUS
  Administered 2020-01-30: 5 [IU] via SUBCUTANEOUS
  Filled 2020-01-30 (×2): qty 1

## 2020-01-30 MED ORDER — LORAZEPAM 2 MG/ML IJ SOLN: 1.0000 mg | Freq: Once | INTRAVENOUS | Status: DC

## 2020-01-30 MED ORDER — HALOPERIDOL LACTATE 5 MG/ML IJ SOLN
INTRAMUSCULAR | Status: AC
  Filled 2020-01-30: qty 1

## 2020-01-30 MED ORDER — GUAIFENESIN-DM 100-10 MG/5ML PO SYRP
10.0000 mL | ORAL_SOLUTION | ORAL | Status: DC | PRN
  Filled 2020-01-30: qty 10

## 2020-01-30 MED ORDER — CHLORHEXIDINE GLUCONATE CLOTH 2 % EX PADS
6.0000 | MEDICATED_PAD | Freq: Every day | CUTANEOUS | Status: DC
  Administered 2020-01-30 – 2020-02-15 (×11): 6 via TOPICAL

## 2020-01-30 MED ORDER — PREDNISONE 20 MG PO TABS: 50.0000 mg | ORAL_TABLET | Freq: Every day | ORAL | Status: DC

## 2020-01-30 MED ORDER — POLYETHYLENE GLYCOL 3350 17 G PO PACK: 17.0000 g | PACK | Freq: Every day | ORAL | Status: DC | PRN

## 2020-01-30 MED ORDER — ASCORBIC ACID 500 MG PO TABS
500.0000 mg | ORAL_TABLET | Freq: Every day | ORAL | Status: DC
  Administered 2020-01-31 – 2020-02-16 (×15): 500 mg
  Filled 2020-01-30 (×15): qty 1

## 2020-01-30 MED ORDER — ASCORBIC ACID 500 MG PO TABS: 500.0000 mg | ORAL_TABLET | Freq: Every day | ORAL | Status: DC

## 2020-01-30 MED ORDER — INSULIN ASPART 100 UNIT/ML ~~LOC~~ SOLN: 0.0000 [IU] | Freq: Every day | SUBCUTANEOUS | Status: DC

## 2020-01-30 MED ORDER — FENTANYL CITRATE (PF) 100 MCG/2ML IJ SOLN
100.0000 ug | Freq: Once | INTRAMUSCULAR | Status: AC
  Administered 2020-01-30: 100 ug via INTRAVENOUS

## 2020-01-30 MED ORDER — LACTATED RINGERS IV SOLN: INTRAVENOUS | Status: DC

## 2020-01-30 MED ORDER — MAGNESIUM HYDROXIDE 400 MG/5ML PO SUSP: 30.0000 mL | Freq: Every day | ORAL | Status: DC | PRN

## 2020-01-30 MED ORDER — LORAZEPAM 2 MG/ML IJ SOLN
INTRAMUSCULAR | Status: AC
  Administered 2020-01-30: 1 mg via INTRAVENOUS
  Filled 2020-01-30: qty 1

## 2020-01-30 MED ORDER — SODIUM CHLORIDE 0.9% FLUSH: 10.0000 mL | INTRAVENOUS | Status: DC | PRN

## 2020-01-30 MED ORDER — FENTANYL 2500MCG IN NS 250ML (10MCG/ML) PREMIX INFUSION
INTRAVENOUS | Status: AC
Start: 2020-01-30 — End: 2020-01-30
  Administered 2020-01-30: 50 ug/h via INTRAVENOUS
  Filled 2020-01-30: qty 250

## 2020-01-30 MED ORDER — ZINC SULFATE 220 (50 ZN) MG PO CAPS: 220.0000 mg | ORAL_CAPSULE | Freq: Every day | ORAL | Status: DC

## 2020-01-30 MED ORDER — INSULIN REGULAR(HUMAN) IN NACL 100-0.9 UT/100ML-% IV SOLN
INTRAVENOUS | Status: DC
  Administered 2020-01-30: 14 [IU]/h via INTRAVENOUS
  Filled 2020-01-30: qty 100

## 2020-01-30 MED ORDER — VITAMIN D 25 MCG (1000 UNIT) PO TABS: 1000.0000 [IU] | ORAL_TABLET | Freq: Every day | ORAL | Status: DC

## 2020-01-30 MED ORDER — POTASSIUM CHLORIDE 10 MEQ/100ML IV SOLN
10.0000 meq | INTRAVENOUS | Status: AC
  Administered 2020-01-30: 10 meq via INTRAVENOUS
  Filled 2020-01-30: qty 100

## 2020-01-30 MED ORDER — ONDANSETRON HCL 4 MG/2ML IJ SOLN: 4.0000 mg | Freq: Four times a day (QID) | INTRAMUSCULAR | Status: DC | PRN

## 2020-01-30 MED ORDER — CHLORHEXIDINE GLUCONATE 0.12% ORAL RINSE (MEDLINE KIT)
15.0000 mL | Freq: Two times a day (BID) | OROMUCOSAL | Status: DC
  Administered 2020-01-30 – 2020-02-01 (×4): 15 mL via OROMUCOSAL

## 2020-01-30 MED ORDER — HALOPERIDOL LACTATE 5 MG/ML IJ SOLN
2.0000 mg | Freq: Once | INTRAMUSCULAR | Status: AC
  Administered 2020-01-30: 2 mg via INTRAVENOUS

## 2020-01-30 MED ORDER — SODIUM BICARBONATE 8.4 % IV SOLN: 100.0000 meq | INTRAVENOUS | Status: DC

## 2020-01-30 NOTE — Progress Notes (Signed)
Remdesivir - Pharmacy Brief Note   A/P:  Remdesivir 200 mg IVPB once followed by 100 mg IVPB daily x 4 days.   Hart Robinsons, PharmD Clinical Pharmacist   01/30/2020 5:20 AM

## 2020-01-30 NOTE — ED Notes (Signed)
Patient keeps trying to sit up and calling for "Becky". Have shut lights off and trying to get pt to rest at this time.

## 2020-01-30 NOTE — ED Notes (Signed)
Pt wanted to roll on her side. Pt seems to be resting and trying to sleep.

## 2020-01-30 NOTE — ED Notes (Signed)
Pt insulin leftr at 2.1/hr due to loss of second iv access. Unable to admin d5 at this time or any other meds. Waiting for intensive MD to place line

## 2020-01-30 NOTE — Progress Notes (Signed)
CRITICAL CARE PROGRESS NOTE   Patient remains lethargic with acute hypoxemic respiratory failure.  She was turned up on face mask interface using non-rebreather to 15 and she also has nasal canula which I turned up to 8L to reach spO2>90%.  She is at high risk of intubation due to inability to protect airway secondary to AMS as well as severe hypoxemia from acute COVID19 infection. Ive updated husband and discussed with him that patient is clinically severely ill and will be in intensive care.     Ottie Glazier, M.D.  Pulmonary & [place]

## 2020-01-30 NOTE — Progress Notes (Signed)
Inpatient Diabetes Program Recommendations  AACE/ADA: New Consensus Statement on Inpatient Glycemic Control (2015)  Target Ranges:  Prepandial:   less than 140 mg/dL      Peak postprandial:   less than 180 mg/dL (1-2 hours)      Critically ill patients:  140 - 180 mg/dL   Lab Results  Component Value Date   GLUCAP 246 (H) 01/30/2020   HGBA1C 8.3 (H) 02/21/2019    Review of Glycemic Control Results for Marissa Cook, Marissa Cook (MRN [id number]) as of 01/30/2020 12:11  Ref. Range 01/30/2020 01:54 01/30/2020 02:22 01/30/2020 02:48 01/30/2020 03:30 01/30/2020 04:44 01/30/2020 05:40 01/30/2020 06:51 01/30/2020 09:23 01/30/2020 10:17 01/30/2020 10:53  Glucose-Capillary Latest Ref Range: 70 - 99 mg/dL 447 (H) 386 (H) 366 (H) 319 (H) 213 (H) 159 (H) 185 (H) 221 (H) 233 (H) 246 (H)   Diabetes history: DM 2 Outpatient Diabetes medications:  Tresiba 45 units q PM, Metformin XR 1000 mg bid, Novolog 8 units with meals plus Novolog correction, Jardiance 25 mg daily Current orders for Inpatient glycemic control:  Solumedrol 72.5 mg bid IV insulin- Inpatient Diabetes Program Recommendations:    Note patient admitted with DKA- Recommend use of Hyperglycemia Crises order set: DKA to ensure that proper monitoring is included such as BMP's, Beta Hydroxybutyrate, etc.   Will follow.   Thanks,  Adah Perl, RN, BC-ADM Inpatient Diabetes Coordinator Pager [phone] (8a-5p)

## 2020-01-30 NOTE — H&P (Addendum)
History and Physical    Marissa Cook MEQ:[id number] DOB: [birth year] DOA: 01/28/2020  PCP: Idelle Crouch, MD   Patient coming from: Home  I have personally briefly reviewed patient's old medical records in [place]  Chief Complaint: " I do not feel well" Most of the history was obtained from patient's husband over the phone as patient is unable to provide any history.  HPI: Marissa Cook is a 54 y.o. female with medical history significant for diabetes mellitus, anxiety, depression, hypothyroidism, pancreatitis and degenerative disc disease who presents to the emergency room via EMS for evaluation because she did not feel well.  Patient was noted to be only alert to self when she arrived to the emergency room and is unable to answer any questions or follow commands. Per patient's husband he had an upper respiratory tract infection and was treated as an outpatient for bronchitis.  He had 2 - Covid tests.  Patient then got sick with what appears to be an upper respiratory tract infection and was seen at the urgent care on 01/27/20. She was started on Zithromax and prednisone as an outpatient for a respiratory infection and had an order to get a Covid test done but was unable to get the test done.  Her husband stated that for the last 2 days she has been in bed and probably had missed taking her insulin. Pulse oximetry on room air upon arrival to the ER was 90% and this improved to 98% on 2 L. I am unable to do a review of systems on this patient due to her mental status changes. Venous blood gas pH 7.08, PCO2 less than 19, PO2 75 Repeat VBG pH 7.10, PCO2 30, Sodium 138, potassium 3.8, chloride 114, bicarb 13, serum glucose 255, BUN 24, creatinine 0.93, calcium 7.2, lactic acid 2.3 Covid PCR test is positive CT angiogram of the chest shows no pulmonary embolism or acute aortic syndrome but shows multifocal, peripheral predominant groundglass opacities most consistent with  multifocal pneumonia including viral pneumonia. CT scan of the head without contrast shows a hypoattenuating focus in the right basal ganglia compatible with remote lacunar infarct versus prominent perivascular space. Chest x-ray reviewed by me shows low lung volumes with patchy heterogeneous bilateral airspace opacities.   Twelve-lead EKG reviewed by me shows sinus tachycardia Patient is unvaccinated.  ED Course: Patient is a 54 year old Caucasian female who was brought into the ER by EMS for evaluation of change in mental status.  Patient was recently started on Zithromax and prednisone for presumed bronchitis upon arrival to the ER was noted to be hyperglycemic with blood sugar of 365.  She was also severely acidotic with PH of 7.10 and was started on an insulin drip. Patient's Covid PCR test is positive and upon arrival she initially required 2 L of oxygen but is now on 5 to 6 L of oxygen to maintain pulse oximetry greater than 92%. Chest x-ray is consistent with Covid pneumonia.  Patient was started on remdesivir and methylprednisolone and will be admitted to the hospital for further evaluation      Review of Systems: As per HPI otherwise 10 point review of systems negative.    Past Medical History:  Diagnosis Date  . Anxiety   . DDD (degenerative disc disease), lumbar   . Depression   . Diabetes mellitus without complication ([place])   . Hyperlipidemia   . Hypothyroidism   . Pancreatitis     Past Surgical History:  Procedure Laterality  Date  . ABDOMINAL HYSTERECTOMY    . ANTERIOR LAT LUMBAR FUSION N/A 02/21/2019   Procedure: ANTERIOR LATERAL LUMBAR INTERBODY FUSION, LATERAL INSTRUMENTATION LUMBAR FOUR- LUMBAR FIVE;  Surgeon: Newman Pies, MD;  Location: [facility];  Service: Neurosurgery;  Laterality: N/A;  anterolateral  . COLONOSCOPY N/A 01/15/2016   Procedure: COLONOSCOPY;  Surgeon: Manya Silvas, MD;  Location: [facility] ENDOSCOPY;  Service: Endoscopy;  Laterality: N/A;  .  CYSTECTOMY    . ESOPHAGOGASTRODUODENOSCOPY (EGD) WITH PROPOFOL N/A 01/15/2016   Procedure: ESOPHAGOGASTRODUODENOSCOPY (EGD) WITH PROPOFOL;  Surgeon: Manya Silvas, MD;  Location: [facility] ENDOSCOPY;  Service: Endoscopy;  Laterality: N/A;     reports that she has never smoked. She has never used smokeless tobacco. She reports that she does not drink alcohol and does not use drugs.  Allergies  Allergen Reactions  . Sulfasalazine     UNSPECIFIED REACTION     History reviewed. No pertinent family history.   Prior to Admission medications   Medication Sig Start Date End Date Taking? Authorizing Provider  aspirin EC 81 MG tablet Take 81 mg by mouth daily. Swallow whole.   Yes [provider]  atorvastatin (LIPITOR) 80 MG tablet Take 1 tablet by mouth daily. 01/17/20 01/16/21 Yes [provider]  DULoxetine (CYMBALTA) 30 MG capsule Take 30 mg by mouth daily. 01/06/2020  Yes [provider]  empagliflozin (JARDIANCE) 25 MG TABS tablet Take 1 tablet by mouth daily. 01/17/20  Yes [provider]  gabapentin (NEURONTIN) 600 MG tablet Take 600 mg by mouth 3 (three) times daily. 01/11/20  Yes [provider]  insulin aspart (NOVOLOG) 100 UNIT/ML injection Inject 8 Units into the skin 3 (three) times daily before meals. Plus sliding scale, MAX 80 units daily.   Yes [provider]  insulin degludec (TRESIBA FLEXTOUCH) 100 UNIT/ML SOPN FlexTouch Pen Inject 45 Units into the skin at bedtime.    Yes [provider]  levothyroxine (SYNTHROID) 300 MCG tablet Take 300 mcg by mouth daily before breakfast.    Yes [provider]  linaclotide (LINZESS) 145 MCG CAPS capsule Take 1 capsule by mouth daily. 01/17/20  Yes [provider]  metFORMIN (GLUCOPHAGE-XR) 500 MG 24 hr tablet Take 2 tablets by mouth in the morning and at bedtime. 01/17/20 03/08/2020 Yes [provider]  metoprolol tartrate (LOPRESSOR) 50 MG tablet Take 1 tablet by  mouth in the morning and at bedtime. 01/17/20  Yes [provider]  QUEtiapine (SEROQUEL) 50 MG tablet Take 100 mg by mouth at bedtime. 01/11/2020  Yes [provider]  cyclobenzaprine (FLEXERIL) 10 MG tablet Take 1 tablet (10 mg total) by mouth 3 (three) times daily as needed for muscle spasms. Patient not taking: Reported on 01/12/2020 02/22/19   Newman Pies, MD  docusate sodium (COLACE) 100 MG capsule Take 1 capsule (100 mg total) by mouth 2 (two) times daily. Patient not taking: Reported on 01/05/2020 02/22/19   Newman Pies, MD  oxyCODONE (OXY IR/ROXICODONE) 5 MG immediate release tablet Take 1 tablet (5 mg total) by mouth every 4 (four) hours as needed for moderate pain ((score 4 to 6)). Patient not taking: Reported on 01/28/2020 02/22/19   Newman Pies, MD    Physical Exam: Vitals:   01/30/20 0630 01/30/20 0700 01/30/20 0745 01/30/20 0800  BP: 124/73 132/89    Pulse: (!) 101 97 (!) 101 (!) 101  Resp: (!) 37 19 (!) 35 (!) 23  Temp:      TempSrc:  SpO2: 94% 92% 94% 91%  Weight:      Height:         Vitals:   01/30/20 0630 01/30/20 0700 01/30/20 0745 01/30/20 0800  BP: 124/73 132/89    Pulse: (!) 101 97 (!) 101 (!) 101  Resp: (!) 37 19 (!) 35 (!) 23  Temp:      TempSrc:      SpO2: 94% 92% 94% 91%  Weight:      Height:        Constitutional: NAD, lethargic and opens eyes to loud verbal stimuli.  Unable to follow commands and repeating same words over and over Eyes: PERRL, lids and conjunctivae normal ENMT: Mucous membranes are dry.  Neck: normal, supple, no masses, no thyromegaly Respiratory: Bilateral air entry , no wheezing, no crackles. Normal respiratory effort. No accessory muscle use.  Cardiovascular: Tachycardic, no murmurs / rubs / gallops. No extremity edema. 2+ pedal pulses. No carotid bruits.  Abdomen: diffuse tenderness, no masses palpated. No hepatosplenomegaly. Bowel sounds positive.  Musculoskeletal: no clubbing / cyanosis. No  joint deformity upper and lower extremities.  Skin: no rashes, lesions, ulcers.  Neurologic: No gross focal neurologic deficit.  Able to move all extremities Psychiatric: Normal mood and affect.   Labs on Admission: I have personally reviewed following labs and imaging studies  CBC: Recent Labs  Lab 01/20/2020 2049  WBC 12.7*  NEUTROABS 9.9*  HGB 15.8*  HCT 47.2*  MCV 90.9  PLT 465   Basic Metabolic Panel: Recent Labs  Lab 01/08/2020 0059 01/08/2020 2049 01/30/20 0341  NA 134* 132* 138  K 4.3 5.1 3.8  CL 111 103 114*  CO2 <7* 11* 13*  GLUCOSE 405* 507* 255*  BUN 24* 28* 24*  CREATININE 1.13* 1.19* 0.93  CALCIUM 6.6* 8.3* 7.2*   GFR: Estimated Creatinine Clearance: 68.4 mL/min (by C-G formula based on SCr of 0.93 mg/dL). Liver Function Tests: Recent Labs  Lab 01/28/2020 2049  AST 68*  ALT 41  ALKPHOS 114  BILITOT 1.8*  PROT 8.0  ALBUMIN 3.6   No results for input(s): LIPASE, AMYLASE in the last 168 hours. No results for input(s): AMMONIA in the last 168 hours. Coagulation Profile: No results for input(s): INR, PROTIME in the last 168 hours. Cardiac Enzymes: No results for input(s): CKTOTAL, CKMB, CKMBINDEX, TROPONINI in the last 168 hours. BNP (last 3 results) No results for input(s): PROBNP in the last 8760 hours. HbA1C: No results for input(s): HGBA1C in the last 72 hours. CBG: Recent Labs  Lab 01/30/20 0248 01/30/20 0330 01/30/20 0444 01/30/20 0540 01/30/20 0651  GLUCAP 366* 319* 213* 159* 185*   Lipid Profile: No results for input(s): CHOL, HDL, LDLCALC, TRIG, CHOLHDL, LDLDIRECT in the last 72 hours. Thyroid Function Tests: No results for input(s): TSH, T4TOTAL, FREET4, T3FREE, THYROIDAB in the last 72 hours. Anemia Panel: No results for input(s): VITAMINB12, FOLATE, FERRITIN, TIBC, IRON, RETICCTPCT in the last 72 hours. Urine analysis:    Component Value Date/Time   COLORURINE STRAW (A) 01/30/2020 0051   APPEARANCEUR CLEAR (A) 01/30/2020 0051     LABSPEC 1.026 01/30/2020 0051   PHURINE 5.0 01/30/2020 0051   GLUCOSEU >=500 (A) 01/30/2020 0051   HGBUR SMALL (A) 01/30/2020 0051   BILIRUBINUR NEGATIVE 01/30/2020 0051   KETONESUR 80 (A) 01/30/2020 0051   PROTEINUR 30 (A) 01/30/2020 0051   NITRITE NEGATIVE 01/30/2020 0051   LEUKOCYTESUR NEGATIVE 01/30/2020 0051    Radiological Exams on Admission: CT Head Wo Contrast  Result Date:  01/24/2020 CLINICAL DATA:  Mental status change, feeling unwell, recently treated for respiratory infection with prednisone EXAM: CT HEAD WITHOUT CONTRAST TECHNIQUE: Contiguous axial images were obtained from the base of the skull through the vertex without intravenous contrast. COMPARISON:  MRI 06/20/2019 FINDINGS: Brain: Hypoattenuating focus in the right basal ganglia compatible with remote lacunar infarct versus prominent perivascular space as detailed on comparison MR. No evidence of acute infarction, hemorrhage, hydrocephalus, extra-axial collection, visible mass lesion or mass effect. Vascular: No hyperdense vessel or unexpected calcification. Skull: No calvarial fracture or suspicious osseous lesion. No scalp swelling or hematoma. Sinuses/Orbits: Paranasal sinuses and mastoid air cells are predominantly clear. Included orbital structures are unremarkable. Other: None. IMPRESSION: 1. No acute intracranial findings. 2. Hypoattenuating focus in the right basal ganglia compatible with remote lacunar infarct versus prominent perivascular space as detailed on comparison. Electronically Signed   By: Lovena Le M.D.   On: 01/22/2020 21:56   CT Angio Chest PE W and/or Wo Contrast  Result Date: 01/28/2020 CLINICAL DATA:  Tachycardia and tachypnea EXAM: CT ANGIOGRAPHY CHEST WITH CONTRAST TECHNIQUE: Multidetector CT imaging of the chest was performed using the standard protocol during bolus administration of intravenous contrast. Multiplanar CT image reconstructions and MIPs were obtained to evaluate the vascular  anatomy. CONTRAST:  123mL OMNIPAQUE IOHEXOL 350 MG/ML SOLN COMPARISON:  None. FINDINGS: Cardiovascular: Contrast injection is sufficient to demonstrate satisfactory opacification of the pulmonary arteries to the segmental level. There is no pulmonary embolus or evidence of right heart strain. The size of the main pulmonary artery is normal. Heart size is normal, with no pericardial effusion. The course and caliber of the aorta are normal. There is no atherosclerotic calcification. Opacification decreased due to pulmonary arterial phase contrast bolus timing. Mediastinum/Nodes: No mediastinal, hilar or axillary lymphadenopathy. Normal visualized thyroid. Thoracic esophageal course is normal. Lungs/Pleura: Multifocal, peripheral predominant ground glass opacities. No pleural effusion. Upper Abdomen: Contrast bolus timing is not optimized for evaluation of the abdominal organs. The visualized portions of the organs of the upper abdomen are normal. Musculoskeletal: No chest wall abnormality. No bony spinal canal stenosis. Review of the MIP images confirms the above findings. IMPRESSION: 1. No pulmonary embolus or acute aortic syndrome. 2. Multifocal, peripheral predominant ground glass opacities, most consistent with multifocal pneumonia, including viral pneumonia. Electronically Signed   By: Ulyses Jarred M.D.   On: 01/03/2020 23:53   DG Chest Portable 1 View  Result Date: 01/02/2020 CLINICAL DATA:  Shortness of breath and weakness. EXAM: PORTABLE CHEST 1 VIEW COMPARISON:  Report from radiograph 09/12/2018, images not available. FINDINGS: Lung volumes are low. The heart is normal in size. Patient is rotated. Patchy heterogeneous bilateral airspace opacities. No pneumothorax or large pleural effusion. No acute osseous abnormalities are seen. IMPRESSION: Low lung volumes with patchy heterogeneous bilateral airspace opacities. Findings may represent pulmonary edema or multifocal pneumonia, including COVID.  Electronically Signed   By: Keith Rake M.D.   On: 01/30/2020 21:47    EKG: Independently reviewed.  Sinus tachycardia  Assessment/Plan Principal Problem:   DKA (diabetic ketoacidoses) Active Problems:   Acute metabolic encephalopathy   Hypothyroidism   Depression   Acute respiratory failure due to COVID-19 ([place])   Pneumonia due to COVID-19 virus     Diabetic ketoacidosis  Patient with a history of insulin-dependent diabetes mellitus who presents to the emergency room for evaluation of mental status changes. Patient had been sick and most likely had not taking her insulin in days She was also recently started on  prednisone which explains her hypoglycemia upon presentation Patient remains altered and is not safe for oral intake at this time Continue insulin drip per protocol Continue dextrose containing fluids Blood sugar checks per protocol   Acute metabolic encephalopathy Appears to be multifactorial and secondary to DKA as well as hypoxia from Covid pneumonia Patient had a CT scan of the head without contrast shows ??  Lacunar infarct. We will obtain MRI of the brain without contrast for further evaluation Expect improvement in patient's mental status following resolution of her acute illness.   Pneumonia due to COVID-19 virus with acute respiratory failure Patient is unvaccinated and developed respiratory symptoms a couple of days prior to admission She was seen at the urgent care center on 01/27/20 and was discharged on Zithromax and prednisone and advised to get a Covid swab Her COVID-19 PCR test is positive and patient is hypoxic and is currently on 5 to 6 L of oxygen to maintain pulse oximetry greater than 92%. Chest x-ray is consistent with Covid pneumonia Place patient on remdesivir per protocol Place patient on methylprednisolone and administer per protocol Supportive care with bronchodilators and antitussives    Hypothyroidism Hold Synthroid until  patient is more awake and alert   Depression Hold antidepressants until patient is more awake and alert    DVT prophylaxis: Lovenox Code Status: Full code Family Communication: Greater than 50% of time was spent discussing patient's condition and plan of care with her husband Tuwana Kapaun over the phone.  All questions and concerns have been addressed.  He verbalizes understanding and agrees with the plan. Disposition Plan: Back to previous home environment Consults called: ICU    Buffie Herne MD [facility]     01/30/2020, 8:56 AM

## 2020-01-30 NOTE — ED Provider Notes (Signed)
-----------------------------------------   3:15 AM on 01/30/2020 -----------------------------------------  I took over care of this patient from Dr. Cinda Quest around midnight.  At that time, the patient was pending admission for DKA.  I initially discussed the case with Dr. Damita Dunnings from the hospitalist service who recommended ICU consultation given the patient's acidosis and altered mental status.  I then discussed the case with Dr. Oletta Darter from the ICU who agreed with consultation and stated that the [facility] would come to see the patient, and then a decision could be made as to whether the patient was more appropriate for the ICU or the hospitalist service.  However, due to multiple procedures and codes, the APP has not yet been able to evaluate the patient.  I will obtain repeat labs; if the patient's pH and anion gap are improving, the patient may not in fact need ICU care and could be admitted directly to the hospitalist service.   ----------------------------------------- 5:09 AM on 01/30/2020 -----------------------------------------  ICU APP evaluated the patient and agreed with the above plan to await repeat labs.  Repeat BMP shows that the anion gap is cleared although the patient does remain acidotic with a low bicarb.  The patient is still altered although her vital signs are stable.  I discussed the case again with ICU APP Hinton Dyer who advises that the ICU recommendation is admission to the hospitalist.  I then discussed the case with Dr. Sidney Ace from the hospitalist service who accepted the admission.  The glucose is now in the 200s, and the patient will be transitioned to a D5 LR infusion.  She has 1 functioning IV at this time so a new IV team consult has been placed.   Arta Silence, MD 01/30/20 [phone]

## 2020-01-30 NOTE — Procedures (Signed)
Endotracheal Intubation: Patient required placement of an artificial airway secondary to Respiratory Failure  Consent: Emergent.   Hand washing performed prior to starting the procedure.   Medications administered for sedation prior to procedure:  Midazolam 2 mg IV,  Rocuronium 10 mg IV, Fentanyl 100 mcg IV.    A time out procedure was called and correct patient, name, & ID confirmed. Needed supplies and equipment were assembled and checked to include ETT, 10 ml syringe, Glidescope, Mac and Miller blades, suction, oxygen and bag mask valve, end tidal CO2 monitor.   Patient was positioned to align the mouth and pharynx to facilitate visualization of the glottis.   Heart rate, SpO2 and blood pressure was continuously monitored during the procedure. Pre-oxygenation was conducted prior to intubation and endotracheal tube was placed through the vocal cords into the trachea.     The artificial airway was placed under direct visualization via glidescope route using a 8.0 ETT on the first attempt.  ETT was secured at 25 cm mark.  Placement was confirmed by auscuitation of lungs with good breath sounds bilaterally and no stomach sounds.  Condensation was noted on endotracheal tube.   Pulse ox 98%.  CO2 detector in place with appropriate color change.   Complications: None .    Chest radiograph ordered and pending.   Comments: OGT placed via glidescope.   Ottie Glazier, M.D.  [facility] [place]

## 2020-01-30 NOTE — ED Notes (Signed)
MD notified pt placed on non rebreathe at this time due to oxygen sat decrease

## 2020-01-30 NOTE — ED Notes (Signed)
Patient intubated by Dr. Lanney Gins. 24 at the lip. Color change noted.  RT will chart vent settings.

## 2020-01-30 NOTE — Progress Notes (Signed)
Spoke with RN, Cecille Rubin, regarding new consult for IV start. Discussed previous findings from earlier [place] assessment of both arms by VAS Team for peripheral IV access. Pt does not have any further appropriate veins for IV or midline placement.

## 2020-01-30 NOTE — ED Notes (Signed)
Report given to Karma Ganja RN

## 2020-01-30 NOTE — ED Notes (Signed)
Pt o2 levels staying consistent between 87%-890%, RN Katie notified

## 2020-01-30 NOTE — ED Notes (Signed)
MD notified that IV access lost and unable to give all medications at this time due to incompatibility.

## 2020-01-30 NOTE — ED Notes (Signed)
1mg  IV ativan administered at this time for pt to tolerate MRI

## 2020-01-30 NOTE — ED Notes (Addendum)
Per endotool clamp insulin drip. Insulin drip clamped at this time. Md Aleskerov notifed. Per Md, continue LR.

## 2020-01-30 NOTE — ED Notes (Signed)
IV insulin drip d/c per Terrence Dupont verbal orders at this time

## 2020-01-30 NOTE — ED Notes (Signed)
IV team at bedside 

## 2020-01-30 NOTE — Consult Note (Addendum)
Name: Marissa Cook MRN: [id number] DOB: May 21, 1965    ADMISSION DATE:  01/18/2020 CONSULTATION DATE: 01/30/2020  REFERRING MD : Dr. Sidney Ace   CHIEF COMPLAINT: AMS   BRIEF PATIENT DESCRIPTION:  54 yo female admitted with acute metabolic encephalopathy, acute metabolic acidosis, DKA, and COVID-19 pneumonia   SIGNIFICANT EVENTS/STUDIES:  09/28: CT Head revealed no acute intracranial findings. Hypoattenuating focus in the right basal ganglia compatible with remote lacunar infarct versus prominent perivascular space as detailed on comparison. 09/28: CTA Chest revealed no pulmonary embolus or acute aortic syndrome. Multifocal, peripheral predominant ground glass opacities, most consistent with multifocal pneumonia, including viral pneumonia. 09/29: Pt admitted to stepdown unit per hospitalist team, however remained in the ER pending bed availability 09/29:  PCCM team consulted to assist with management of COVID pneumonia and acute metabolic encephalopathy  HISTORY OF PRESENT ILLNESS:   This is a 54 yo female with a PMH of Acute Pancreatitis, Hypothyroidism, HLD, Type II Diabetes Mellitus, HTN, H Pylori, Depression, Anemia, DDD, and Fatty Liver Disease.  She presented to [facility] ER via EMS on 09/28 with altered mental status and c/o "not feeling well."  Upon arrival to the ER pt alert to self only.  She was prescribed azithromycin and prednisone due to acute respiratory failure by her PCP on 09/26 while awaiting for final COVID-19 results.  In the ER lab results ruled pt in for DKA and metabolic acidosis, therefore insulin gtt initiated.  COVID-19 positive and CXR concerning for pulmonary edema vs. multifocal pneumonia.   CTA Chest negative for PE or acute aortic syndrome, but concerning for multifocal pneumonia.  CT Head negative for acute intracranial findings.  She was subsequently admitted to the stepdown unit per hospitalist team for additional workup and treatment, but remained in the ER  pending bed availability.  PCCM team consulted on 09/29 to assist with management of COVID pneumonia and acute metabolic encephalopathy.    PAST MEDICAL HISTORY :   has a past medical history of Anxiety, DDD (degenerative disc disease), lumbar, Depression, Diabetes mellitus without complication ([place]), Hyperlipidemia, Hypothyroidism, and Pancreatitis.  has a past surgical history that includes Abdominal hysterectomy; Cystectomy; Colonoscopy (N/A, 01/15/2016); Esophagogastroduodenoscopy (egd) with propofol (N/A, 01/15/2016); and Anterior lat lumbar fusion (N/A, 02/21/2019). Prior to Admission medications   Medication Sig Start Date End Date Taking? Authorizing Provider  aspirin EC 81 MG tablet Take 81 mg by mouth daily. Swallow whole.   Yes [provider]  atorvastatin (LIPITOR) 80 MG tablet Take 1 tablet by mouth daily. 01/17/20 01/16/21 Yes [provider]  DULoxetine (CYMBALTA) 30 MG capsule Take 30 mg by mouth daily. 01/23/2020  Yes [provider]  empagliflozin (JARDIANCE) 25 MG TABS tablet Take 1 tablet by mouth daily. 01/17/20  Yes [provider]  gabapentin (NEURONTIN) 600 MG tablet Take 600 mg by mouth 3 (three) times daily. 01/11/20  Yes [provider]  insulin aspart (NOVOLOG) 100 UNIT/ML injection Inject 8 Units into the skin 3 (three) times daily before meals. Plus sliding scale, MAX 80 units daily.   Yes [provider]  insulin degludec (TRESIBA FLEXTOUCH) 100 UNIT/ML SOPN FlexTouch Pen Inject 45 Units into the skin at bedtime.    Yes [provider]  levothyroxine (SYNTHROID) 300 MCG tablet Take 300 mcg by mouth daily before breakfast.    Yes [provider]  linaclotide (LINZESS) 145 MCG CAPS capsule Take 1 capsule by mouth daily. 01/17/20  Yes [provider]  metFORMIN (GLUCOPHAGE-XR) 500 MG 24 hr  tablet Take 2 tablets by mouth in the morning and at bedtime. 01/17/20 03-11-2020 Yes [provider]    metoprolol tartrate (LOPRESSOR) 50 MG tablet Take 1 tablet by mouth in the morning and at bedtime. 01/17/20  Yes [provider]  QUEtiapine (SEROQUEL) 50 MG tablet Take 100 mg by mouth at bedtime. 01/15/2020  Yes [provider]  cyclobenzaprine (FLEXERIL) 10 MG tablet Take 1 tablet (10 mg total) by mouth 3 (three) times daily as needed for muscle spasms. Patient not taking: Reported on 01/24/2020 02/22/19   Newman Pies, MD  docusate sodium (COLACE) 100 MG capsule Take 1 capsule (100 mg total) by mouth 2 (two) times daily. Patient not taking: Reported on 01/27/2020 02/22/19   Newman Pies, MD  oxyCODONE (OXY IR/ROXICODONE) 5 MG immediate release tablet Take 1 tablet (5 mg total) by mouth every 4 (four) hours as needed for moderate pain ((score 4 to 6)). Patient not taking: Reported on 01/18/2020 02/22/19   Newman Pies, MD   Allergies  Allergen Reactions  . Sulfasalazine     UNSPECIFIED REACTION     FAMILY HISTORY:  family history is not on file. SOCIAL HISTORY:  reports that she has never smoked. She has never used smokeless tobacco. She reports that she does not drink alcohol and does not use drugs.  REVIEW OF SYSTEMS:   Unable to assess pt confused   SUBJECTIVE:  Unable to assess pt confused   VITAL SIGNS: Temp:  [97.7 F (36.5 C)] 97.7 F (36.5 C) (09/28 2031) Pulse Rate:  [105-159] 106 (09/29 0243) Resp:  [22-40] 26 (09/29 0243) BP: (120-162)/(74-99) 162/74 (09/29 0230) SpO2:  [84 %-100 %] 96 % (09/29 0243) Weight:  [72.6 kg] 72.6 kg (09/28 2043)  PHYSICAL EXAMINATION: General: acutely ill appearing female, NAD  Neuro: confused, alert to self only, follows simple commands, PERRL HEENT: supple, no JVD  Cardiovascular: sinus rhythm, rrr, no R/G Lungs: diminished throughout, even, non labored  Abdomen: hypoactive BS X4, obese, soft  Musculoskeletal: normal bulk and tone, no edema  Skin: intact no rashes or lesions present   Recent Labs  Lab  01/08/2020 0059 01/30/2020 2049 01/30/20 0341  NA 134* 132* 138  K 4.3 5.1 3.8  CL 111 103 114*  CO2 <7* 11* 13*  BUN 24* 28* 24*  CREATININE 1.13* 1.19* 0.93  GLUCOSE 405* 507* 255*   Recent Labs  Lab 01/09/2020 2049  HGB 15.8*  HCT 47.2*  WBC 12.7*  PLT 200   CT Head Wo Contrast  Result Date: 01/04/2020 CLINICAL DATA:  Mental status change, feeling unwell, recently treated for respiratory infection with prednisone EXAM: CT HEAD WITHOUT CONTRAST TECHNIQUE: Contiguous axial images were obtained from the base of the skull through the vertex without intravenous contrast. COMPARISON:  MRI 06/20/2019 FINDINGS: Brain: Hypoattenuating focus in the right basal ganglia compatible with remote lacunar infarct versus prominent perivascular space as detailed on comparison MR. No evidence of acute infarction, hemorrhage, hydrocephalus, extra-axial collection, visible mass lesion or mass effect. Vascular: No hyperdense vessel or unexpected calcification. Skull: No calvarial fracture or suspicious osseous lesion. No scalp swelling or hematoma. Sinuses/Orbits: Paranasal sinuses and mastoid air cells are predominantly clear. Included orbital structures are unremarkable. Other: None. IMPRESSION: 1. No acute intracranial findings. 2. Hypoattenuating focus in the right basal ganglia compatible with remote lacunar infarct versus prominent perivascular space as detailed on comparison. Electronically Signed   By: Lovena Le M.D.   On: 01/09/2020 21:56   CT Angio Chest PE  W and/or Wo Contrast  Result Date: 01/21/2020 CLINICAL DATA:  Tachycardia and tachypnea EXAM: CT ANGIOGRAPHY CHEST WITH CONTRAST TECHNIQUE: Multidetector CT imaging of the chest was performed using the standard protocol during bolus administration of intravenous contrast. Multiplanar CT image reconstructions and MIPs were obtained to evaluate the vascular anatomy. CONTRAST:  152mL OMNIPAQUE IOHEXOL 350 MG/ML SOLN COMPARISON:  None. FINDINGS:  Cardiovascular: Contrast injection is sufficient to demonstrate satisfactory opacification of the pulmonary arteries to the segmental level. There is no pulmonary embolus or evidence of right heart strain. The size of the main pulmonary artery is normal. Heart size is normal, with no pericardial effusion. The course and caliber of the aorta are normal. There is no atherosclerotic calcification. Opacification decreased due to pulmonary arterial phase contrast bolus timing. Mediastinum/Nodes: No mediastinal, hilar or axillary lymphadenopathy. Normal visualized thyroid. Thoracic esophageal course is normal. Lungs/Pleura: Multifocal, peripheral predominant ground glass opacities. No pleural effusion. Upper Abdomen: Contrast bolus timing is not optimized for evaluation of the abdominal organs. The visualized portions of the organs of the upper abdomen are normal. Musculoskeletal: No chest wall abnormality. No bony spinal canal stenosis. Review of the MIP images confirms the above findings. IMPRESSION: 1. No pulmonary embolus or acute aortic syndrome. 2. Multifocal, peripheral predominant ground glass opacities, most consistent with multifocal pneumonia, including viral pneumonia. Electronically Signed   By: Ulyses Jarred M.D.   On: 01/02/2020 23:53   DG Chest Portable 1 View  Result Date: 01/12/2020 CLINICAL DATA:  Shortness of breath and weakness. EXAM: PORTABLE CHEST 1 VIEW COMPARISON:  Report from radiograph 09/12/2018, images not available. FINDINGS: Lung volumes are low. The heart is normal in size. Patient is rotated. Patchy heterogeneous bilateral airspace opacities. No pneumothorax or large pleural effusion. No acute osseous abnormalities are seen. IMPRESSION: Low lung volumes with patchy heterogeneous bilateral airspace opacities. Findings may represent pulmonary edema or multifocal pneumonia, including COVID. Electronically Signed   By: Keith Rake M.D.   On: 01/07/2020 21:47    ASSESSMENT /  PLAN:  Acute hypoxic respiratory failure secondary to COVID-19 pneumonia and acute metabolic acidosis  Prn supplemental O2 to maintain O2 sats 88% or higher Continue remdesivir, vitamins, and iv steroids Trend inflammatory markers  Encourage self-proning as tolerated  Prn antitussives  Pulmonary hygiene   Diabetic Ketoacidosis  Severe metabolic acidosis in the setting of DKA  Continuous telemetry monitoring  Per ICU Intensivist recommendations transitioning pt off insulin gtt due to closure of anion gap-20 units lantus bid and SSI   Will start sodium bicarb gtt @125  ml/hr Repeat VBG and BMP pending   Hypothyroidism  TSH level pending   Acute encephalopathy secondary to metabolic derangements and [id number]  Correct metabolic derangements  Frequent reorientation  Avoid sedating medications  If mentation does not improve following resolution of metabolic acidosis will need to consider MR Brain   Best Practice VTE px: subq lovenox SUP px: pepcid  Code status: Full code   Case discussed with ICU Intensivist Dr. Willy Eddy, [place] Pager [phone] (please enter 7 digits) PCCM Consult Pager [phone] (please enter 7 digits)

## 2020-01-30 NOTE — ED Notes (Signed)
Pt increased from 10l to 15l/min non rebreather to maintain oxygen sat

## 2020-01-30 NOTE — ED Notes (Signed)
X-ray at bedside

## 2020-01-30 NOTE — Progress Notes (Signed)
Received consult for IV access. Pt was assessed earlier with [place] and 1 IV obtained at that time; pt has no additional veins appropriate for peripheral access (including midline). Securechat sent to consulting RN with recommendation for considering central line.

## 2020-01-30 NOTE — ED Notes (Signed)
Pt currently inconsolable and unable to be redirected. Continues to call out "baby", " can I", unable to answer questions at this time

## 2020-01-31 ENCOUNTER — Inpatient Hospital Stay: Payer: BC Managed Care – PPO

## 2020-01-31 LAB — BLOOD GAS, ARTERIAL
Acid-base deficit: 12.4 mmol/L — ABNORMAL HIGH (ref 0.0–2.0)
Acid-base deficit: 4.3 mmol/L — ABNORMAL HIGH (ref 0.0–2.0)
Bicarbonate: 12.7 mmol/L — ABNORMAL LOW (ref 20.0–28.0)
Bicarbonate: 18.3 mmol/L — ABNORMAL LOW (ref 20.0–28.0)
FIO2: 0.35
FIO2: 0.35
MECHVT: 450 mL
MECHVT: 450 mL
O2 Saturation: 87.9 %
O2 Saturation: 90.4 %
PEEP: 10 cmH2O
PEEP: 8 cmH2O
Patient temperature: 37
Patient temperature: 37
RATE: 18 resp/min
RATE: 18 resp/min
pCO2 arterial: 27 mmHg — ABNORMAL LOW (ref 32.0–48.0)
pCO2 arterial: 27 mmHg — ABNORMAL LOW (ref 32.0–48.0)
pH, Arterial: 7.28 — ABNORMAL LOW (ref 7.350–7.450)
pH, Arterial: 7.44 (ref 7.350–7.450)
pO2, Arterial: 57 mmHg — ABNORMAL LOW (ref 83.0–108.0)
pO2, Arterial: 62 mmHg — ABNORMAL LOW (ref 83.0–108.0)

## 2020-01-31 LAB — COMPREHENSIVE METABOLIC PANEL
ALT: 28 U/L (ref 0–44)
AST: 51 U/L — ABNORMAL HIGH (ref 15–41)
Albumin: 2.7 g/dL — ABNORMAL LOW (ref 3.5–5.0)
Alkaline Phosphatase: 81 U/L (ref 38–126)
Anion gap: 9 (ref 5–15)
BUN: 31 mg/dL — ABNORMAL HIGH (ref 6–20)
CO2: 16 mmol/L — ABNORMAL LOW (ref 22–32)
Calcium: 7.4 mg/dL — ABNORMAL LOW (ref 8.9–10.3)
Chloride: 114 mmol/L — ABNORMAL HIGH (ref 98–111)
Creatinine, Ser: 0.92 mg/dL (ref 0.44–1.00)
GFR calc Af Amer: >60 mL/min (ref >60)
GFR calc non Af Amer: >60 mL/min (ref >60)
Glucose, Bld: 348 mg/dL — ABNORMAL HIGH (ref 70–99)
Potassium: 4 mmol/L (ref 3.5–5.1)
Sodium: 139 mmol/L (ref 135–145)
Total Bilirubin: 0.9 mg/dL (ref 0.3–1.2)
Total Protein: 6.3 g/dL — ABNORMAL LOW (ref 6.5–8.1)

## 2020-01-31 LAB — GLUCOSE, CAPILLARY
Glucose-Capillary: 148 mg/dL — ABNORMAL HIGH (ref 70–99)
Glucose-Capillary: 153 mg/dL — ABNORMAL HIGH (ref 70–99)
Glucose-Capillary: 206 mg/dL — ABNORMAL HIGH (ref 70–99)
Glucose-Capillary: 235 mg/dL — ABNORMAL HIGH (ref 70–99)
Glucose-Capillary: 282 mg/dL — ABNORMAL HIGH (ref 70–99)
Glucose-Capillary: 296 mg/dL — ABNORMAL HIGH (ref 70–99)
Glucose-Capillary: 304 mg/dL — ABNORMAL HIGH (ref 70–99)
Glucose-Capillary: 312 mg/dL — ABNORMAL HIGH (ref 70–99)

## 2020-01-31 LAB — MAGNESIUM: Magnesium: 2 mg/dL (ref 1.7–2.4)

## 2020-01-31 LAB — HEMOGLOBIN A1C
Hgb A1c MFr Bld: 11.2 % — ABNORMAL HIGH (ref 4.8–5.6)
Mean Plasma Glucose: 274.74 mg/dL

## 2020-01-31 LAB — CBC WITH DIFFERENTIAL/PLATELET
Abs Immature Granulocytes: 0.1 10*3/uL — ABNORMAL HIGH (ref 0.00–0.07)
Basophils Absolute: 0 10*3/uL (ref 0.0–0.1)
Basophils Relative: 0 %
Eosinophils Absolute: 0 10*3/uL (ref 0.0–0.5)
Eosinophils Relative: 0 %
HCT: 37.7 % (ref 36.0–46.0)
Hemoglobin: 13 g/dL (ref 12.0–15.0)
Immature Granulocytes: 1 %
Lymphocytes Relative: 15 %
Lymphs Abs: 1.1 10*3/uL (ref 0.7–4.0)
MCH: 30.6 pg (ref 26.0–34.0)
MCHC: 34.5 g/dL (ref 30.0–36.0)
MCV: 88.7 fL (ref 80.0–100.0)
Monocytes Absolute: 0.5 10*3/uL (ref 0.1–1.0)
Monocytes Relative: 6 %
Neutro Abs: 5.7 10*3/uL (ref 1.7–7.7)
Neutrophils Relative %: 78 %
Platelets: 156 10*3/uL (ref 150–400)
RBC: 4.25 MIL/uL (ref 3.87–5.11)
RDW: 14.4 % (ref 11.5–15.5)
WBC: 7.3 10*3/uL (ref 4.0–10.5)
nRBC: 0 % (ref 0.0–0.2)

## 2020-01-31 LAB — LIPASE, BLOOD: Lipase: 130 U/L — ABNORMAL HIGH (ref 11–51)

## 2020-01-31 LAB — PHOSPHORUS
Phosphorus: 1.1 mg/dL — ABNORMAL LOW (ref 2.5–4.6)
Phosphorus: 2.5 mg/dL (ref 2.5–4.6)

## 2020-01-31 LAB — PROCALCITONIN: Procalcitonin: 0.17 ng/mL

## 2020-01-31 MED ORDER — ZINC SULFATE 220 (50 ZN) MG PO CAPS
220.0000 mg | ORAL_CAPSULE | Freq: Every day | ORAL | Status: DC
  Administered 2020-01-31 – 2020-02-14 (×14): 220 mg
  Filled 2020-01-31 (×13): qty 1

## 2020-01-31 MED ORDER — ENOXAPARIN SODIUM 40 MG/0.4ML ~~LOC~~ SOLN
40.0000 mg | SUBCUTANEOUS | Status: DC
  Administered 2020-01-31 – 2020-02-03 (×4): 40 mg via SUBCUTANEOUS
  Filled 2020-01-31 (×4): qty 0.4

## 2020-01-31 MED ORDER — INSULIN DETEMIR 100 UNIT/ML ~~LOC~~ SOLN
20.0000 [IU] | Freq: Two times a day (BID) | SUBCUTANEOUS | Status: DC
  Administered 2020-01-31: 20 [IU] via SUBCUTANEOUS
  Filled 2020-01-31 (×3): qty 0.2

## 2020-01-31 MED ORDER — DEXAMETHASONE SODIUM PHOSPHATE 10 MG/ML IJ SOLN
6.0000 mg | INTRAMUSCULAR | Status: AC
  Administered 2020-01-31 – 2020-02-08 (×9): 6 mg via INTRAVENOUS
  Filled 2020-01-31 (×9): qty 0.6

## 2020-01-31 MED ORDER — FUROSEMIDE 10 MG/ML IJ SOLN: 40.0000 mg | Freq: Once | INTRAMUSCULAR | Status: DC

## 2020-01-31 MED ORDER — INSULIN DETEMIR 100 UNIT/ML ~~LOC~~ SOLN
25.0000 [IU] | Freq: Two times a day (BID) | SUBCUTANEOUS | Status: DC
  Administered 2020-01-31 – 2020-02-06 (×12): 25 [IU] via SUBCUTANEOUS
  Filled 2020-01-31 (×15): qty 0.25

## 2020-01-31 MED ORDER — ONDANSETRON HCL 4 MG/2ML IJ SOLN
4.0000 mg | Freq: Four times a day (QID) | INTRAMUSCULAR | Status: DC | PRN
  Administered 2020-02-10: 4 mg via INTRAVENOUS
  Filled 2020-01-31: qty 2

## 2020-01-31 MED ORDER — ACETAMINOPHEN 325 MG PO TABS
650.0000 mg | ORAL_TABLET | Freq: Four times a day (QID) | ORAL | Status: DC | PRN
  Administered 2020-01-31 – 2020-02-10 (×11): 650 mg
  Filled 2020-01-31 (×11): qty 2

## 2020-01-31 MED ORDER — LEVETIRACETAM IN NACL 1000 MG/100ML IV SOLN
1000.0000 mg | Freq: Once | INTRAVENOUS | Status: AC
  Administered 2020-01-31: 1000 mg via INTRAVENOUS
  Filled 2020-01-31: qty 100

## 2020-01-31 MED ORDER — POLYETHYLENE GLYCOL 3350 17 G PO PACK: 17.0000 g | PACK | Freq: Every day | ORAL | Status: DC | PRN

## 2020-01-31 MED ORDER — LACTATED RINGERS IV SOLN
INTRAVENOUS | Status: DC
  Administered 2020-02-03: 999 mL via INTRAVENOUS

## 2020-01-31 MED ORDER — MAGNESIUM HYDROXIDE 400 MG/5ML PO SUSP: 30.0000 mL | Freq: Every day | ORAL | Status: DC | PRN

## 2020-01-31 MED ORDER — LEVOTHYROXINE SODIUM 100 MCG PO TABS
300.0000 ug | ORAL_TABLET | Freq: Every day | ORAL | Status: DC
  Administered 2020-02-01 – 2020-02-16 (×15): 300 ug
  Filled 2020-01-31 (×15): qty 3

## 2020-01-31 MED ORDER — SODIUM BICARBONATE 8.4 % IV SOLN
50.0000 meq | Freq: Once | INTRAVENOUS | Status: AC
  Administered 2020-01-31: 50 meq via INTRAVENOUS
  Filled 2020-01-31: qty 50

## 2020-01-31 MED ORDER — SODIUM PHOSPHATES 45 MMOLE/15ML IV SOLN
45.0000 mmol | Freq: Once | INTRAVENOUS | Status: AC
  Administered 2020-01-31: 45 mmol via INTRAVENOUS
  Filled 2020-01-31: qty 15

## 2020-01-31 MED ORDER — ONDANSETRON HCL 4 MG PO TABS: 4.0000 mg | ORAL_TABLET | Freq: Four times a day (QID) | ORAL | Status: DC | PRN

## 2020-01-31 MED ORDER — GUAIFENESIN-DM 100-10 MG/5ML PO SYRP: 10.0000 mL | ORAL_SOLUTION | ORAL | Status: DC | PRN

## 2020-01-31 MED ORDER — LEVETIRACETAM IN NACL 500 MG/100ML IV SOLN
500.0000 mg | Freq: Two times a day (BID) | INTRAVENOUS | Status: DC
  Filled 2020-01-31 (×2): qty 100

## 2020-01-31 MED ORDER — SODIUM BICARBONATE 8.4 % IV SOLN
100.0000 meq | Freq: Once | INTRAVENOUS | Status: AC
  Administered 2020-01-31: 100 meq via INTRAVENOUS
  Filled 2020-01-31: qty 100

## 2020-01-31 MED ORDER — QUETIAPINE FUMARATE 25 MG PO TABS
100.0000 mg | ORAL_TABLET | Freq: Every day | ORAL | Status: DC
  Administered 2020-01-31 – 2020-02-14 (×13): 100 mg
  Filled 2020-01-31 (×13): qty 4

## 2020-01-31 MED ORDER — INSULIN DETEMIR 100 UNIT/ML ~~LOC~~ SOLN
20.0000 [IU] | Freq: Every day | SUBCUTANEOUS | Status: DC
  Filled 2020-01-31: qty 0.2

## 2020-01-31 MED ORDER — LORAZEPAM 2 MG/ML IJ SOLN
2.0000 mg | Freq: Once | INTRAMUSCULAR | Status: AC
  Administered 2020-01-31: 2 mg via INTRAVENOUS
  Filled 2020-01-31: qty 1

## 2020-01-31 MED ORDER — INSULIN ASPART 100 UNIT/ML ~~LOC~~ SOLN
0.0000 [IU] | SUBCUTANEOUS | Status: DC
  Administered 2020-01-31: 11 [IU] via SUBCUTANEOUS
  Administered 2020-01-31: 15 [IU] via SUBCUTANEOUS
  Administered 2020-01-31: 7 [IU] via SUBCUTANEOUS
  Administered 2020-01-31: 15 [IU] via SUBCUTANEOUS
  Administered 2020-01-31: 7 [IU] via SUBCUTANEOUS
  Administered 2020-01-31 – 2020-02-01 (×2): 11 [IU] via SUBCUTANEOUS
  Administered 2020-02-01: 7 [IU] via SUBCUTANEOUS
  Administered 2020-02-01 (×2): 3 [IU] via SUBCUTANEOUS
  Administered 2020-02-01 – 2020-02-02 (×3): 4 [IU] via SUBCUTANEOUS
  Administered 2020-02-02 – 2020-02-05 (×5): 3 [IU] via SUBCUTANEOUS
  Administered 2020-02-05 (×4): 4 [IU] via SUBCUTANEOUS
  Administered 2020-02-06: 7 [IU] via SUBCUTANEOUS
  Administered 2020-02-06: 11 [IU] via SUBCUTANEOUS
  Administered 2020-02-06: 21 [IU] via SUBCUTANEOUS
  Administered 2020-02-06 (×2): 20 [IU] via SUBCUTANEOUS
  Administered 2020-02-06: 15 [IU] via SUBCUTANEOUS
  Filled 2020-01-31 (×27): qty 1

## 2020-01-31 MED ORDER — HYDROCOD POLST-CPM POLST ER 10-8 MG/5ML PO SUER: 5.0000 mL | Freq: Two times a day (BID) | ORAL | Status: DC | PRN

## 2020-01-31 NOTE — Procedures (Signed)
Dr. Lanney Gins asked me to wait on eeg for icu 7.  Said he may not want it after she is extubated.

## 2020-01-31 NOTE — Progress Notes (Signed)
Inpatient Diabetes Program Recommendations  AACE/ADA: New Consensus Statement on Inpatient Glycemic Control (2015)  Target Ranges:  Prepandial:   less than 140 mg/dL      Peak postprandial:   less than 180 mg/dL (1-2 hours)      Critically ill patients:  140 - 180 mg/dL   Lab Results  Component Value Date   GLUCAP 296 (H) 01/31/2020   HGBA1C 8.3 (H) 02/21/2019    Review of Glycemic Control Results for Marissa Cook, Marissa Cook (MRN [id number]) as of 01/31/2020 08:47  Ref. Range 01/30/2020 19:07 01/30/2020 23:30 01/31/2020 03:31 01/31/2020 08:41  Glucose-Capillary Latest Ref Range: 70 - 99 mg/dL 234 (H) 304 (H) 312 (H) 296 (H)   Diabetes history: DM 2 Outpatient Diabetes medications:  Tresiba 45 units q PM, Metformin XR 1000 mg bid, Novolog 8 units with meals plus Novolog correction, Jardiance 25 mg daily Current orders for Inpatient glycemic control:  Levemir 20 units qd + Decadron 6 mg q 24 hrs.  Inpatient Diabetes Program Recommendations:   Patient has received total of Novolog 44 units correction since IV insulin drip discontinued. Consider: -Increase Levemir to 20 units bid with additional dose to be given this am Page to Marda Stalker NP Critical Care with recommendations.  Thank you, Nani Gasser. Yuritzi Kamp, RN, MSN, CDE  Diabetes Coordinator Inpatient Glycemic Control Team Team Pager [phone] (8am-5pm) 01/31/2020 10:20 AM

## 2020-01-31 NOTE — Procedures (Signed)
  Ultrasound guided peripheral IV Additional access requested by ED RN.  Currently has one IV in right arm. Using ultrasound guidance, place 20 g peripheral IV in left arm.  ___________________ Samuella Cota. Reinaldo Berber, MD [facility]

## 2020-01-31 NOTE — Progress Notes (Signed)
Pt with intermittent bilateral upper extremity twitching and upward gaze concerning for possible seizure activity vs. myoclonic jerks orders placed for 1 gram of iv keppra stat followed by maintenance dose of iv keppra 500 mg q12hr, 2 mg of ativan, and EEG.  Will continue to monitor and assess pt.  Marda Stalker, [place] Pager [phone] (please enter 7 digits) PCCM Consult Pager [phone] (please enter 7 digits)

## 2020-01-31 NOTE — Progress Notes (Signed)
Name: Marissa Cook MRN: [id number] DOB: January 16, 1966    ADMISSION DATE:  01/22/2020 CONSULTATION DATE: 01/30/2020  REFERRING MD : Dr. Sidney Ace   CHIEF COMPLAINT: AMS   BRIEF PATIENT DESCRIPTION:  54 yo female admitted with acute metabolic encephalopathy, acute metabolic acidosis, DKA, and COVID-19 pneumonia   SIGNIFICANT EVENTS/STUDIES:  09/28: CT Head revealed no acute intracranial findings. Hypoattenuating focus in the right basal ganglia compatible with remote lacunar infarct versus prominent perivascular space as detailed on comparison. 09/28: CTA Chest revealed no pulmonary embolus or acute aortic syndrome. Multifocal, peripheral predominant ground glass opacities, most consistent with multifocal pneumonia, including viral pneumonia. 09/29: Pt admitted to stepdown unit per hospitalist team, however remained in the ER pending bed availability 09/29:  PCCM team consulted to assist with management of COVID pneumonia and acute metabolic encephalopathy 9/37:  Patient is weaned to 35% on ventilator, weaned off sedation with mentation slowly waking up but not following verbal communication yet. Will hold off on EEG for now due to SBT and awakening trial.  Some concern for possible seizure activity overnight, we are monitoring closely and will perform EEG if any re-emergence of symptoms is noted.   HISTORY OF PRESENT ILLNESS:   This is a 54 yo female with a PMH of Acute Pancreatitis, Hypothyroidism, HLD, Type II Diabetes Mellitus, HTN, H Pylori, Depression, Anemia, DDD, and Fatty Liver Disease.  She presented to [facility] ER via EMS on 09/28 with altered mental status and c/o "not feeling well."  Upon arrival to the ER pt alert to self only.  She was prescribed azithromycin and prednisone due to acute respiratory failure by her PCP on 09/26 while awaiting for final COVID-19 results.  In the ER lab results ruled pt in for DKA and metabolic acidosis, therefore insulin gtt initiated.  COVID-19 positive  and CXR concerning for pulmonary edema vs. multifocal pneumonia.   CTA Chest negative for PE or acute aortic syndrome, but concerning for multifocal pneumonia.  CT Head negative for acute intracranial findings.  She was subsequently admitted to the stepdown unit per hospitalist team for additional workup and treatment, but remained in the ER pending bed availability.  PCCM team consulted on 09/29 to assist with management of COVID pneumonia and acute metabolic encephalopathy.    PAST MEDICAL HISTORY :   has a past medical history of Anxiety, DDD (degenerative disc disease), lumbar, Depression, Diabetes mellitus without complication ([place]), Hyperlipidemia, Hypothyroidism, and Pancreatitis.  has a past surgical history that includes Abdominal hysterectomy; Cystectomy; Colonoscopy (N/A, 01/15/2016); Esophagogastroduodenoscopy (egd) with propofol (N/A, 01/15/2016); and Anterior lat lumbar fusion (N/A, 02/21/2019). Prior to Admission medications   Medication Sig Start Date End Date Taking? Authorizing Provider  aspirin EC 81 MG tablet Take 81 mg by mouth daily. Swallow whole.   Yes [provider]  atorvastatin (LIPITOR) 80 MG tablet Take 1 tablet by mouth daily. 01/17/20 01/16/21 Yes [provider]  DULoxetine (CYMBALTA) 30 MG capsule Take 30 mg by mouth daily. 01/26/2020  Yes [provider]  empagliflozin (JARDIANCE) 25 MG TABS tablet Take 1 tablet by mouth daily. 01/17/20  Yes [provider]  gabapentin (NEURONTIN) 600 MG tablet Take 600 mg by mouth 3 (three) times daily. 01/11/20  Yes [provider]  insulin aspart (NOVOLOG) 100 UNIT/ML injection Inject 8 Units into the skin 3 (three) times daily before meals. Plus sliding scale, MAX 80 units daily.   Yes [provider]  insulin degludec (TRESIBA FLEXTOUCH) 100 UNIT/ML SOPN FlexTouch Pen Inject 45  Units into the skin at bedtime.    Yes [provider]  levothyroxine (SYNTHROID) 300 MCG tablet  Take 300 mcg by mouth daily before breakfast.    Yes [provider]  linaclotide (LINZESS) 145 MCG CAPS capsule Take 1 capsule by mouth daily. 01/17/20  Yes [provider]  metFORMIN (GLUCOPHAGE-XR) 500 MG 24 hr tablet Take 2 tablets by mouth in the morning and at bedtime. 01/17/20 24-Feb-2020 Yes [provider]  metoprolol tartrate (LOPRESSOR) 50 MG tablet Take 1 tablet by mouth in the morning and at bedtime. 01/17/20  Yes [provider]  QUEtiapine (SEROQUEL) 50 MG tablet Take 100 mg by mouth at bedtime. 01/10/2020  Yes [provider]  cyclobenzaprine (FLEXERIL) 10 MG tablet Take 1 tablet (10 mg total) by mouth 3 (three) times daily as needed for muscle spasms. Patient not taking: Reported on 01/28/2020 02/22/19   Newman Pies, MD  docusate sodium (COLACE) 100 MG capsule Take 1 capsule (100 mg total) by mouth 2 (two) times daily. Patient not taking: Reported on 01/30/2020 02/22/19   Newman Pies, MD  oxyCODONE (OXY IR/ROXICODONE) 5 MG immediate release tablet Take 1 tablet (5 mg total) by mouth every 4 (four) hours as needed for moderate pain ((score 4 to 6)). Patient not taking: Reported on 01/15/2020 02/22/19   Newman Pies, MD   Allergies  Allergen Reactions  . Sulfasalazine     UNSPECIFIED REACTION     FAMILY HISTORY:  family history is not on file. SOCIAL HISTORY:  reports that she has never smoked. She has never used smokeless tobacco. She reports that she does not drink alcohol and does not use drugs.  REVIEW OF SYSTEMS:   Unable to assess pt confused   SUBJECTIVE:  Unable to assess pt confused   VITAL SIGNS: Temp:  [97.5 F (36.4 C)-101.1 F (38.4 C)] 99.3 F (37.4 C) (09/30 1615) Pulse Rate:  [60-105] 62 (09/30 1616) Resp:  [20-24] 21 (09/30 1616) BP: (104-148)/(73-91) 117/73 (09/30 1600) SpO2:  [86 %-100 %] 96 % (09/30 1616) FiO2 (%):  [35 %-70 %] 40 % (09/30 1302) Weight:  [79.4 kg-79.5 kg] 79.5 kg (09/30  0339)  PHYSICAL EXAMINATION: General: acutely ill appearing female, NAD  Neuro: confused, alert to self only, follows simple commands, PERRL HEENT: supple, no JVD  Cardiovascular: sinus rhythm, rrr, no R/G Lungs: diminished throughout, even, non labored  Abdomen: hypoactive BS X4, obese, soft  Musculoskeletal: normal bulk and tone, no edema  Skin: intact no rashes or lesions present   Recent Labs  Lab 01/30/20 1607 01/30/20 2003 01/31/20 0343  NA 141 136 139  K 2.7* 4.6 4.0  CL 119* 113* 114*  CO2 16* 11* 16*  BUN 21* 30* 31*  CREATININE 0.52 0.92 0.92  GLUCOSE 137* 329* 348*   Recent Labs  Lab 01/22/2020 2049 01/30/20 1607 01/31/20 0343  HGB 15.8* 15.2* 13.0  HCT 47.2* 43.7 37.7  WBC 12.7* 11.1* 7.3  PLT 200 198 156   CT Head Wo Contrast  Result Date: 01/20/2020 CLINICAL DATA:  Mental status change, feeling unwell, recently treated for respiratory infection with prednisone EXAM: CT HEAD WITHOUT CONTRAST TECHNIQUE: Contiguous axial images were obtained from the base of the skull through the vertex without intravenous contrast. COMPARISON:  MRI 06/20/2019 FINDINGS: Brain: Hypoattenuating focus in the right basal ganglia compatible with remote lacunar infarct versus prominent perivascular space as detailed on comparison MR. No evidence of acute infarction, hemorrhage, hydrocephalus, extra-axial collection, visible mass lesion or mass  effect. Vascular: No hyperdense vessel or unexpected calcification. Skull: No calvarial fracture or suspicious osseous lesion. No scalp swelling or hematoma. Sinuses/Orbits: Paranasal sinuses and mastoid air cells are predominantly clear. Included orbital structures are unremarkable. Other: None. IMPRESSION: 1. No acute intracranial findings. 2. Hypoattenuating focus in the right basal ganglia compatible with remote lacunar infarct versus prominent perivascular space as detailed on comparison. Electronically Signed   By: Lovena Le M.D.   On:  01/13/2020 21:56   CT Angio Chest PE W and/or Wo Contrast  Result Date: 01/19/2020 CLINICAL DATA:  Tachycardia and tachypnea EXAM: CT ANGIOGRAPHY CHEST WITH CONTRAST TECHNIQUE: Multidetector CT imaging of the chest was performed using the standard protocol during bolus administration of intravenous contrast. Multiplanar CT image reconstructions and MIPs were obtained to evaluate the vascular anatomy. CONTRAST:  166mL OMNIPAQUE IOHEXOL 350 MG/ML SOLN COMPARISON:  None. FINDINGS: Cardiovascular: Contrast injection is sufficient to demonstrate satisfactory opacification of the pulmonary arteries to the segmental level. There is no pulmonary embolus or evidence of right heart strain. The size of the main pulmonary artery is normal. Heart size is normal, with no pericardial effusion. The course and caliber of the aorta are normal. There is no atherosclerotic calcification. Opacification decreased due to pulmonary arterial phase contrast bolus timing. Mediastinum/Nodes: No mediastinal, hilar or axillary lymphadenopathy. Normal visualized thyroid. Thoracic esophageal course is normal. Lungs/Pleura: Multifocal, peripheral predominant ground glass opacities. No pleural effusion. Upper Abdomen: Contrast bolus timing is not optimized for evaluation of the abdominal organs. The visualized portions of the organs of the upper abdomen are normal. Musculoskeletal: No chest wall abnormality. No bony spinal canal stenosis. Review of the MIP images confirms the above findings. IMPRESSION: 1. No pulmonary embolus or acute aortic syndrome. 2. Multifocal, peripheral predominant ground glass opacities, most consistent with multifocal pneumonia, including viral pneumonia. Electronically Signed   By: Ulyses Jarred M.D.   On: 01/03/2020 23:53   MR BRAIN WO CONTRAST  Result Date: 01/30/2020 CLINICAL DATA:  Neuro deficit, acute, stroke suspected. Altered mental status. EXAM: MRI HEAD WITHOUT CONTRAST TECHNIQUE: Multiplanar, multiecho  pulse sequences of the brain and surrounding structures were obtained without intravenous contrast. COMPARISON:  Head CT 01/19/2020 FINDINGS: Brain: Intermittently motion degraded examination. Most notably, there is mild/moderate motion degradation of the sagittal T1 weighted and axial T2* sequences. Cerebral volume is normal for age. Redemonstrated small chronic lacunar infarct versus prominent perivascular space within the right basal ganglia (series 7, image 14). There is no acute infarct. No evidence of intracranial mass. No chronic intracranial blood products. No extra-axial fluid collection. No midline shift. Vascular: Expected proximal arterial flow voids. Skull and upper cervical spine: No focal marrow lesion. Sinuses/Orbits: Visualized orbits show no acute finding. Minimal left sphenoid sinus mucosal thickening. Trace fluid within right mastoid air cells. IMPRESSION: Motion degraded examination as described. No evidence of acute intracranial abnormality, including acute infarction. Redemonstrated small chronic lacunar infarct versus prominent perivascular space within the right basal ganglia. Minimal left sphenoid sinus mucosal thickening. Trace fluid within right mastoid air cells. Electronically Signed   By: Kellie Simmering DO   On: 01/30/2020 12:54   DG Chest Port 1 View  Result Date: 01/31/2020 CLINICAL DATA:  Acute respiratory failure. EXAM: PORTABLE CHEST 1 VIEW COMPARISON:  01/30/2020. FINDINGS: Endotracheal tube, NG tube in stable position. Heart size normal. Low lung volumes. Progressive diffuse bilateral pulmonary infiltrates/edema. No pleural effusion or pneumothorax. IMPRESSION: 1. Lines and tubes in stable position. 2. Low lung volumes. Progressive diffuse bilateral pulmonary infiltrates/edema.  Electronically Signed   By: Marcello Moores  Register   On: 01/31/2020 05:14   DG Chest Portable 1 View  Result Date: 01/30/2020 CLINICAL DATA:  Status post intubation and OG tube placement today. EXAM:  PORTABLE CHEST 1 VIEW COMPARISON:  Single-view of the chest earlier today. FINDINGS: New endotracheal tube is in place with the tip in good position at the level of the clavicular heads. OG tube courses into the stomach and below the inferior margin of the film. Extensive bilateral airspace disease persists. No pneumothorax. Heart size is normal. IMPRESSION: ETT in good position. OG tube courses into the stomach and below the inferior margin the film. No change in multifocal pneumonia. Electronically Signed   By: Inge Rise M.D.   On: 01/30/2020 16:13   DG Chest Portable 1 View  Result Date: 01/09/2020 CLINICAL DATA:  Shortness of breath and weakness. EXAM: PORTABLE CHEST 1 VIEW COMPARISON:  Report from radiograph 09/12/2018, images not available. FINDINGS: Lung volumes are low. The heart is normal in size. Patient is rotated. Patchy heterogeneous bilateral airspace opacities. No pneumothorax or large pleural effusion. No acute osseous abnormalities are seen. IMPRESSION: Low lung volumes with patchy heterogeneous bilateral airspace opacities. Findings may represent pulmonary edema or multifocal pneumonia, including COVID. Electronically Signed   By: Keith Rake M.D.   On: 01/18/2020 21:47   DG Abd Portable 1 View  Result Date: 01/30/2020 CLINICAL DATA:  Status post OG tube placement today. EXAM: PORTABLE ABDOMEN - 1 VIEW COMPARISON:  None. FINDINGS: OG tube is in good position with the tip and side-port in the stomach. IMPRESSION: OG tube in good position. Electronically Signed   By: Inge Rise M.D.   On: 01/30/2020 16:14    ASSESSMENT / PLAN:  Acute hypoxic respiratory failure secondary to COVID-19 pneumonia and acute metabolic acidosis  Prn supplemental O2 to maintain O2 sats 88% or higher Continue remdesivir, vitamins, and iv steroids Trend inflammatory markers  Encourage self-proning as tolerated  Prn antitussives  Pulmonary hygiene   Diabetic Ketoacidosis  Severe metabolic  acidosis in the setting of DKA  Continuous telemetry monitoring  Per ICU Intensivist recommendations transitioning pt off insulin gtt due to closure of anion gap-20 units lantus bid and SSI   Will start sodium bicarb gtt @125  ml/hr Repeat VBG and BMP pending  -9/30- increased levemir [place] to 25 bid   Possible seizure activity  - will hold keppra for now to allow patient to wake up for extubation   - if any seizure activity is noted please restart keppra and re-order eeg as well as neurology evaluation.   Hypothyroidism  TSH level pending   Acute encephalopathy secondary to metabolic derangements and [id number]  Correct metabolic derangements  Frequent reorientation  Avoid sedating medications  If mentation does not improve following resolution of metabolic acidosis will need to consider MR Brain   Best Practice VTE px: subq lovenox SUP px: pepcid  Code status: Full code   Critical care provider statement:    Critical care time (minutes):  33   Critical care time was exclusive of:  Separately billable procedures and  treating other patients   Critical care was necessary to treat or prevent imminent or  life-threatening deterioration of the following conditions:  acute hypoxemic respiratory failure , covid19 pneumonia, DKA.   Critical care was time spent personally by me on the following  activities:  Development of treatment plan with patient or surrogate,  discussions with consultants, evaluation of patient's response to  treatment,  examination of patient, obtaining history from patient or  surrogate, ordering and performing treatments and interventions, ordering  and review of laboratory studies and re-evaluation of patient's condition   I assumed direction of critical care for this patient from another  provider in my specialty: no

## 2020-01-31 NOTE — Progress Notes (Signed)
Initial Nutrition Assessment  DOCUMENTATION CODES:   Obesity unspecified  INTERVENTION:  Once phosphorus within acceptable range, recommend initiating:  -Vital AF 1.2 Cal at 15 mL/hr and advancing by 15 mL/hr every 8 hours to goal rate of 45 mL/hr (1080 mL goal daily volume) per tube -PROSource TF 90 mL BID per tube -Goal regimen provides 1456 kcal, 125 grams of protein, 875 mL H2O daily -Provide MVI daily per tube  Monitor magnesium, potassium, and phosphorus daily for at least 3 days, MD to replete as needed, as pt is at risk for refeeding syndrome.  NUTRITION DIAGNOSIS:   Inadequate oral intake related to inability to eat as evidenced by NPO status.  GOAL:   Patient will meet greater than or equal to 90% of their needs  MONITOR:   Vent status, Labs, Weight trends, TF tolerance, Skin, I & O's  REASON FOR ASSESSMENT:   Ventilator    ASSESSMENT:   54 year old female with PMHx of anxiety, depression, DM, hypothyroidism, HLD admitted with COVID-19 PNA, DKA, acute metabolic encephalopathy.   9/29 transferred to ICU from ED and intubated  Patient is currently intubated on ventilator support MV: 9.7 L/min Temp (24hrs), Avg:99.3 F (37.4 C), Min:97.5 F (36.4 C), Max:101.1 F (38.4 C)  Medications reviewed and include: vitamin C 500 mg daily, vitamin D3 1000 units daily, Decadron 6 mg Q24hrs IV, Novolog 0-20 units Q4hrs, Levemir 20 units BID, levothyroxine, zinc sulfate 220 mg daily, Precedex gtt, famotidine, fentanyl gtt, LR at 50 mL/hr, Keppra, remdesivir, sodium phosphate 45 mmol in D5.  Labs reviewed: CBG 282-304, Chloride 114, CO2 16, BUN 31, Phosphorus 1.1.  I/O: 1550 mL UOP yesterday (0.8 mL/kg/hr)  Enteral Access: 16 Fr. OGT placed 9/29; terminates in stomach per abdominal x-ray 9/29  Discussed with RN and on rounds. Plan is to hold off on initiation of enteral nutrition until phosphorus is replaced and in acceptable range.  NUTRITION - FOCUSED PHYSICAL  EXAM:  Deferred.  Diet Order:   Diet Order            Diet NPO time specified  Diet effective now                EDUCATION NEEDS:   No education needs have been identified at this time  Skin:  Skin Assessment: Skin Integrity Issues: Skin Integrity Issues:: Stage I Stage I: coccyx  Last BM:  Unknown/PTA  Height:   Ht Readings from Last 1 Encounters:  01/30/20 5' 2.99" (1.6 m)   Weight:   Wt Readings from Last 1 Encounters:  01/31/20 79.5 kg   Ideal Body Weight:  52.3 kg  BMI:  Body mass index is 31.05 kg/m.  Estimated Nutritional Needs:   Kcal:  1391  Protein:  120-130 grams  Fluid:  >/= 2.3 L/day  Jacklynn Barnacle, MS, RD, LDN Pager number available on Amion

## 2020-02-01 DIAGNOSIS — G9341 Metabolic encephalopathy: Secondary | ICD-10-CM | POA: Diagnosis not present

## 2020-02-01 LAB — COMPREHENSIVE METABOLIC PANEL
ALT: 26 U/L (ref 0–44)
AST: 53 U/L — ABNORMAL HIGH (ref 15–41)
Albumin: 2.5 g/dL — ABNORMAL LOW (ref 3.5–5.0)
Alkaline Phosphatase: 74 U/L (ref 38–126)
Anion gap: 10 (ref 5–15)
BUN: 22 mg/dL — ABNORMAL HIGH (ref 6–20)
CO2: 22 mmol/L (ref 22–32)
Calcium: 6.9 mg/dL — ABNORMAL LOW (ref 8.9–10.3)
Chloride: 112 mmol/L — ABNORMAL HIGH (ref 98–111)
Creatinine, Ser: 0.73 mg/dL (ref 0.44–1.00)
GFR calc Af Amer: >60 mL/min (ref >60)
GFR calc non Af Amer: >60 mL/min (ref >60)
Glucose, Bld: 155 mg/dL — ABNORMAL HIGH (ref 70–99)
Potassium: 3.6 mmol/L (ref 3.5–5.1)
Sodium: 144 mmol/L (ref 135–145)
Total Bilirubin: 1.2 mg/dL (ref 0.3–1.2)
Total Protein: 5.9 g/dL — ABNORMAL LOW (ref 6.5–8.1)

## 2020-02-01 LAB — MAGNESIUM: Magnesium: 1.9 mg/dL (ref 1.7–2.4)

## 2020-02-01 LAB — CBC WITH DIFFERENTIAL/PLATELET
Abs Immature Granulocytes: 0.1 10*3/uL — ABNORMAL HIGH (ref 0.00–0.07)
Basophils Absolute: 0 10*3/uL (ref 0.0–0.1)
Basophils Relative: 0 %
Eosinophils Absolute: 0 10*3/uL (ref 0.0–0.5)
Eosinophils Relative: 0 %
HCT: 37.5 % (ref 36.0–46.0)
Hemoglobin: 12.9 g/dL (ref 12.0–15.0)
Immature Granulocytes: 1 %
Lymphocytes Relative: 12 %
Lymphs Abs: 0.9 10*3/uL (ref 0.7–4.0)
MCH: 30.5 pg (ref 26.0–34.0)
MCHC: 34.4 g/dL (ref 30.0–36.0)
MCV: 88.7 fL (ref 80.0–100.0)
Monocytes Absolute: 0.7 10*3/uL (ref 0.1–1.0)
Monocytes Relative: 9 %
Neutro Abs: 5.5 10*3/uL (ref 1.7–7.7)
Neutrophils Relative %: 78 %
Platelets: 161 10*3/uL (ref 150–400)
RBC: 4.23 MIL/uL (ref 3.87–5.11)
RDW: 14.6 % (ref 11.5–15.5)
WBC: 7.1 10*3/uL (ref 4.0–10.5)
nRBC: 0 % (ref 0.0–0.2)

## 2020-02-01 LAB — T4, FREE: Free T4: 0.38 ng/dL — ABNORMAL LOW (ref 0.61–1.12)

## 2020-02-01 LAB — GLUCOSE, CAPILLARY
Glucose-Capillary: 141 mg/dL — ABNORMAL HIGH (ref 70–99)
Glucose-Capillary: 148 mg/dL — ABNORMAL HIGH (ref 70–99)
Glucose-Capillary: 200 mg/dL — ABNORMAL HIGH (ref 70–99)
Glucose-Capillary: 212 mg/dL — ABNORMAL HIGH (ref 70–99)
Glucose-Capillary: 252 mg/dL — ABNORMAL HIGH (ref 70–99)
Glucose-Capillary: 85 mg/dL (ref 70–99)
Glucose-Capillary: 98 mg/dL (ref 70–99)

## 2020-02-01 LAB — AMMONIA: Ammonia: 42 umol/L — ABNORMAL HIGH (ref 9–35)

## 2020-02-01 LAB — PHOSPHORUS: Phosphorus: 1.9 mg/dL — ABNORMAL LOW (ref 2.5–4.6)

## 2020-02-01 LAB — VITAMIN B12: Vitamin B-12: 204 pg/mL (ref 180–914)

## 2020-02-01 MED ORDER — CHLORHEXIDINE GLUCONATE 0.12 % MT SOLN
15.0000 mL | Freq: Two times a day (BID) | OROMUCOSAL | Status: DC
  Administered 2020-02-01: 15 mL via OROMUCOSAL
  Filled 2020-02-01: qty 15

## 2020-02-01 MED ORDER — ACETAMINOPHEN 650 MG RE SUPP
650.0000 mg | RECTAL | Status: DC | PRN
  Administered 2020-02-02: 650 mg via RECTAL
  Filled 2020-02-01: qty 1

## 2020-02-01 MED ORDER — THIAMINE HCL 100 MG/ML IJ SOLN
100.0000 mg | Freq: Every day | INTRAMUSCULAR | Status: DC
  Administered 2020-02-01 – 2020-02-15 (×15): 100 mg via INTRAVENOUS
  Filled 2020-02-01 (×14): qty 2

## 2020-02-01 MED ORDER — MORPHINE SULFATE (PF) 2 MG/ML IV SOLN
2.0000 mg | Freq: Once | INTRAVENOUS | Status: AC
  Administered 2020-02-01: 2 mg via INTRAVENOUS
  Filled 2020-02-01: qty 1

## 2020-02-01 MED ORDER — ORAL CARE MOUTH RINSE
15.0000 mL | Freq: Two times a day (BID) | OROMUCOSAL | Status: DC
  Administered 2020-02-01 (×2): 15 mL via OROMUCOSAL

## 2020-02-01 MED ORDER — POTASSIUM PHOSPHATES 15 MMOLE/5ML IV SOLN
30.0000 mmol | Freq: Once | INTRAVENOUS | Status: AC
  Administered 2020-02-01: 30 mmol via INTRAVENOUS
  Filled 2020-02-01: qty 10

## 2020-02-01 MED ORDER — CALCIUM GLUCONATE-NACL 1-0.675 GM/50ML-% IV SOLN
1.0000 g | Freq: Once | INTRAVENOUS | Status: AC
  Administered 2020-02-01: 1000 mg via INTRAVENOUS
  Filled 2020-02-01: qty 50

## 2020-02-01 NOTE — Progress Notes (Signed)
Pt extubated without complications to bipap, tolerating well at this time. Will continue to monitor.

## 2020-02-01 NOTE — Procedures (Signed)
eeg attempted. Pt very combative and now the wires have been ripped out of headbox. EEG machine not working at this time.

## 2020-02-01 NOTE — Progress Notes (Signed)
Name: Marissa Cook MRN: [id number] DOB: 1965/08/09    ADMISSION DATE:  01/20/2020 CONSULTATION DATE: 01/30/2020  REFERRING MD : Dr. Sidney Ace   CHIEF COMPLAINT: AMS   BRIEF PATIENT DESCRIPTION:  54 yo female admitted with acute metabolic encephalopathy, acute metabolic acidosis, DKA, and COVID-19 pneumonia   SIGNIFICANT EVENTS/STUDIES:  09/28: CT Head revealed no acute intracranial findings. Hypoattenuating focus in the right basal ganglia compatible with remote lacunar infarct versus prominent perivascular space as detailed on comparison. 09/28: CTA Chest revealed no pulmonary embolus or acute aortic syndrome. Multifocal, peripheral predominant ground glass opacities, most consistent with multifocal pneumonia, including viral pneumonia. 09/29: Pt admitted to stepdown unit per hospitalist team, however remained in the ER pending bed availability 09/29:  PCCM team consulted to assist with management of COVID pneumonia and acute metabolic encephalopathy 5/63:  Patient is weaned to 35% on ventilator, weaned off sedation with mentation slowly waking up but not following verbal communication yet. Will hold off on EEG for now due to SBT and awakening trial.  Some concern for possible seizure activity overnight, we are monitoring closely and will perform EEG if any re-emergence of symptoms is noted.  02/01/20- patient is liberated from MV, she still is encephalopathic its unclear if she had central event.  I discussed case with neurology and there is plan for repeat EEG.    HISTORY OF PRESENT ILLNESS:   This is a 54 yo female with a PMH of Acute Pancreatitis, Hypothyroidism, HLD, Type II Diabetes Mellitus, HTN, H Pylori, Depression, Anemia, DDD, and Fatty Liver Disease.  She presented to [facility] ER via EMS on 09/28 with altered mental status and c/o "not feeling well."  Upon arrival to the ER pt alert to self only.  She was prescribed azithromycin and prednisone due to acute respiratory failure by  her PCP on 09/26 while awaiting for final COVID-19 results.  In the ER lab results ruled pt in for DKA and metabolic acidosis, therefore insulin gtt initiated.  COVID-19 positive and CXR concerning for pulmonary edema vs. multifocal pneumonia.   CTA Chest negative for PE or acute aortic syndrome, but concerning for multifocal pneumonia.  CT Head negative for acute intracranial findings.  She was subsequently admitted to the stepdown unit per hospitalist team for additional workup and treatment, but remained in the ER pending bed availability.  PCCM team consulted on 09/29 to assist with management of COVID pneumonia and acute metabolic encephalopathy.    PAST MEDICAL HISTORY :   has a past medical history of Anxiety, DDD (degenerative disc disease), lumbar, Depression, Diabetes mellitus without complication ([place]), Hyperlipidemia, Hypothyroidism, and Pancreatitis.  has a past surgical history that includes Abdominal hysterectomy; Cystectomy; Colonoscopy (N/A, 01/15/2016); Esophagogastroduodenoscopy (egd) with propofol (N/A, 01/15/2016); and Anterior lat lumbar fusion (N/A, 02/21/2019). Prior to Admission medications   Medication Sig Start Date End Date Taking? Authorizing Provider  aspirin EC 81 MG tablet Take 81 mg by mouth daily. Swallow whole.   Yes [provider]  atorvastatin (LIPITOR) 80 MG tablet Take 1 tablet by mouth daily. 01/17/20 01/16/21 Yes [provider]  DULoxetine (CYMBALTA) 30 MG capsule Take 30 mg by mouth daily. 01/09/2020  Yes [provider]  empagliflozin (JARDIANCE) 25 MG TABS tablet Take 1 tablet by mouth daily. 01/17/20  Yes [provider]  gabapentin (NEURONTIN) 600 MG tablet Take 600 mg by mouth 3 (three) times daily. 01/11/20  Yes [provider]  insulin aspart (NOVOLOG) 100 UNIT/ML injection Inject 8 Units into the  skin 3 (three) times daily before meals. Plus sliding scale, MAX 80 units daily.   Yes [provider]  insulin  degludec (TRESIBA FLEXTOUCH) 100 UNIT/ML SOPN FlexTouch Pen Inject 45 Units into the skin at bedtime.    Yes [provider]  levothyroxine (SYNTHROID) 300 MCG tablet Take 300 mcg by mouth daily before breakfast.    Yes [provider]  linaclotide (LINZESS) 145 MCG CAPS capsule Take 1 capsule by mouth daily. 01/17/20  Yes [provider]  metFORMIN (GLUCOPHAGE-XR) 500 MG 24 hr tablet Take 2 tablets by mouth in the morning and at bedtime. 01/17/20 03/17/2020 Yes [provider]  metoprolol tartrate (LOPRESSOR) 50 MG tablet Take 1 tablet by mouth in the morning and at bedtime. 01/17/20  Yes [provider]  QUEtiapine (SEROQUEL) 50 MG tablet Take 100 mg by mouth at bedtime. 01/16/2020  Yes [provider]  cyclobenzaprine (FLEXERIL) 10 MG tablet Take 1 tablet (10 mg total) by mouth 3 (three) times daily as needed for muscle spasms. Patient not taking: Reported on 01/22/2020 02/22/19   Newman Pies, MD  docusate sodium (COLACE) 100 MG capsule Take 1 capsule (100 mg total) by mouth 2 (two) times daily. Patient not taking: Reported on 01/05/2020 02/22/19   Newman Pies, MD  oxyCODONE (OXY IR/ROXICODONE) 5 MG immediate release tablet Take 1 tablet (5 mg total) by mouth every 4 (four) hours as needed for moderate pain ((score 4 to 6)). Patient not taking: Reported on 01/19/2020 02/22/19   Newman Pies, MD   Allergies  Allergen Reactions  . Sulfasalazine     UNSPECIFIED REACTION     FAMILY HISTORY:  family history is not on file. SOCIAL HISTORY:  reports that she has never smoked. She has never used smokeless tobacco. She reports that she does not drink alcohol and does not use drugs.  REVIEW OF SYSTEMS:   Unable to assess pt confused   SUBJECTIVE:  Unable to assess pt confused   VITAL SIGNS: Temp:  [97 F (36.1 C)-100.9 F (38.3 C)] 97.3 F (36.3 C) (10/01 0930) Pulse Rate:  [53-94] 90 (10/01 0930) Resp:  [11-24] 15 (10/01  0930) BP: (104-148)/(66-89) 126/82 (10/01 0930) SpO2:  [86 %-100 %] 89 % (10/01 0930) FiO2 (%):  [28 %-45 %] 45 % (10/01 0915) Weight:  [80.7 kg] 80.7 kg (10/01 0346)  PHYSICAL EXAMINATION: General: acutely ill appearing female, NAD  Neuro: confused, alert to self only, follows simple commands, PERRL HEENT: supple, no JVD  Cardiovascular: sinus rhythm, rrr, no R/G Lungs: diminished throughout, even, non labored  Abdomen: hypoactive BS X4, obese, soft  Musculoskeletal: normal bulk and tone, no edema  Skin: intact no rashes or lesions present   Recent Labs  Lab 01/30/20 2003 01/31/20 0343 02/01/20 0348  NA 136 139 144  K 4.6 4.0 3.6  CL 113* 114* 112*  CO2 11* 16* 22  BUN 30* 31* 22*  CREATININE 0.92 0.92 0.73  GLUCOSE 329* 348* 155*   Recent Labs  Lab 01/30/20 1607 01/31/20 0343 02/01/20 0348  HGB 15.2* 13.0 12.9  HCT 43.7 37.7 37.5  WBC 11.1* 7.3 7.1  PLT 198 156 161   MR BRAIN WO CONTRAST  Result Date: 01/30/2020 CLINICAL DATA:  Neuro deficit, acute, stroke suspected. Altered mental status. EXAM: MRI HEAD WITHOUT CONTRAST TECHNIQUE: Multiplanar, multiecho pulse sequences of the brain and surrounding structures were obtained without intravenous contrast. COMPARISON:  Head CT 01/10/2020 FINDINGS: Brain: Intermittently motion degraded examination. Most notably, there is mild/moderate  motion degradation of the sagittal T1 weighted and axial T2* sequences. Cerebral volume is normal for age. Redemonstrated small chronic lacunar infarct versus prominent perivascular space within the right basal ganglia (series 7, image 14). There is no acute infarct. No evidence of intracranial mass. No chronic intracranial blood products. No extra-axial fluid collection. No midline shift. Vascular: Expected proximal arterial flow voids. Skull and upper cervical spine: No focal marrow lesion. Sinuses/Orbits: Visualized orbits show no acute finding. Minimal left sphenoid sinus mucosal thickening.  Trace fluid within right mastoid air cells. IMPRESSION: Motion degraded examination as described. No evidence of acute intracranial abnormality, including acute infarction. Redemonstrated small chronic lacunar infarct versus prominent perivascular space within the right basal ganglia. Minimal left sphenoid sinus mucosal thickening. Trace fluid within right mastoid air cells. Electronically Signed   By: Kellie Simmering DO   On: 01/30/2020 12:54   DG Chest Port 1 View  Result Date: 01/31/2020 CLINICAL DATA:  Acute respiratory failure. EXAM: PORTABLE CHEST 1 VIEW COMPARISON:  01/30/2020. FINDINGS: Endotracheal tube, NG tube in stable position. Heart size normal. Low lung volumes. Progressive diffuse bilateral pulmonary infiltrates/edema. No pleural effusion or pneumothorax. IMPRESSION: 1. Lines and tubes in stable position. 2. Low lung volumes. Progressive diffuse bilateral pulmonary infiltrates/edema. Electronically Signed   By: Marcello Moores  Register   On: 01/31/2020 05:14   DG Chest Portable 1 View  Result Date: 01/30/2020 CLINICAL DATA:  Status post intubation and OG tube placement today. EXAM: PORTABLE CHEST 1 VIEW COMPARISON:  Single-view of the chest earlier today. FINDINGS: New endotracheal tube is in place with the tip in good position at the level of the clavicular heads. OG tube courses into the stomach and below the inferior margin of the film. Extensive bilateral airspace disease persists. No pneumothorax. Heart size is normal. IMPRESSION: ETT in good position. OG tube courses into the stomach and below the inferior margin the film. No change in multifocal pneumonia. Electronically Signed   By: Inge Rise M.D.   On: 01/30/2020 16:13   DG Abd Portable 1 View  Result Date: 01/30/2020 CLINICAL DATA:  Status post OG tube placement today. EXAM: PORTABLE ABDOMEN - 1 VIEW COMPARISON:  None. FINDINGS: OG tube is in good position with the tip and side-port in the stomach. IMPRESSION: OG tube in good  position. Electronically Signed   By: Inge Rise M.D.   On: 01/30/2020 16:14    ASSESSMENT / PLAN:  Acute hypoxic respiratory failure secondary to COVID-19 pneumonia and acute metabolic acidosis  Prn supplemental O2 to maintain O2 sats 88% or higher Continue remdesivir, vitamins, and iv steroids Trend inflammatory markers  Encourage self-proning as tolerated  Prn antitussives  Pulmonary hygiene   Diabetic Ketoacidosis  Severe metabolic acidosis in the setting of DKA  Continuous telemetry monitoring  Per ICU Intensivist recommendations transitioning pt off insulin gtt due to closure of anion gap-20 units lantus bid and SSI   Will start sodium bicarb gtt @125  ml/hr Repeat VBG and BMP pending  -9/30- increased levemir [place] to 25 bid   Possible seizure activity  - will hold keppra for now to allow patient to wake up for extubation   - if any seizure activity is noted please restart keppra and re-order eeg as well as neurology evaluation.   - reviewed plan with neurologist - plan for EEG.   Hypothyroidism  TSH level pending   Acute encephalopathy secondary to metabolic derangements and [id number]  Correct metabolic derangements  Frequent reorientation  Avoid sedating medications  If mentation does not improve following resolution of metabolic acidosis will need to consider MR Brain   Best Practice VTE px: subq lovenox SUP px: pepcid  Code status: Full code   Critical care provider statement:    Critical care time (minutes):  33   Critical care time was exclusive of:  Separately billable procedures and  treating other patients   Critical care was necessary to treat or prevent imminent or  life-threatening deterioration of the following conditions:  acute hypoxemic respiratory failure , covid19 pneumonia, DKA.   Critical care was time spent personally by me on the following  activities:  Development of treatment plan with patient or surrogate,  discussions with  consultants, evaluation of patient's response to  treatment, examination of patient, obtaining history from patient or  surrogate, ordering and performing treatments and interventions, ordering  and review of laboratory studies and re-evaluation of patient's condition   I assumed direction of critical care for this patient from another  provider in my specialty: no

## 2020-02-01 NOTE — Procedures (Signed)
Patient Name: Marissa Cook  MRN: [id number]  Epilepsy Attending: Lora Havens  Referring Physician/Provider: Dr Roland Rack Date: 02/01/2020 Duration: 20.37 mins  Patient history:  54 year old female with acute encephalopathy in the setting of Covid pneumonia.  Of note, she has been evaluated by Dr. Melrose Nakayama with neurology for two episodes of confusion in the past that were "like being drunk."  Since being admitted, she had an episode of some shaking associated with upward eye deviation that was concerning for seizure. EEG to evaluate for seizure.   Level of alertness: lethargic  AEDs during EEG study: None  Technical aspects: This EEG study was done with scalp electrodes positioned according to the 10-20 International system of electrode placement. Electrical activity was acquired at a sampling rate of 500Hz  and reviewed with a high frequency filter of 70Hz  and a low frequency filter of 1Hz . EEG data were recorded continuously and digitally stored.   Description: EEG showed continuous generalized 3 to 6 Hz theta-delta slowing. Hyperventilation and photic stimulation were not performed.     ABNORMALITY -Continuous slow, generalized  IMPRESSION: This study is suggestive of severe diffuse encephalopathy, nonspecific etiology but could be related to sedation. No seizures or epileptiform discharges were seen throughout the recording.  [name] [name]

## 2020-02-01 NOTE — Progress Notes (Signed)
eeg done °

## 2020-02-01 NOTE — Consult Note (Signed)
Neurology Consultation Reason for Consult: Altered mental status Referring Physician: Lanney Gins, F  CC: Altered mental status  History is obtained from: Chart review  HPI: Marissa Cook is a 54 y.o. female who presented with Covid pneumonia and has developed encephalopathy in that setting.  Of note, she has been evaluated by Dr. Melrose Nakayama with neurology for two episodes of confusion in the past that were "like being drunk."   Since being admitted, she had an episode of some shaking associated with upward eye deviation that was concerning for seizure.  She was being sedated until yesterday when she was extubated and given that she is still encephalopathic today, neurology has been consulted.    ROS: A 14 point ROS was performed and is negative except as noted in the HPI.  Past Medical History:  Diagnosis Date  . Anxiety   . DDD (degenerative disc disease), lumbar   . Depression   . Diabetes mellitus without complication ([place])   . Hyperlipidemia   . Hypothyroidism   . Pancreatitis      History reviewed. No pertinent family history.   Social History:  reports that she has never smoked. She has never used smokeless tobacco. She reports that she does not drink alcohol and does not use drugs.   Exam: Current vital signs: BP 131/86 (BP Location: Right Wrist)   Pulse 64   Temp 99 F (37.2 C) (Bladder)   Resp 12   Ht 5' 2.99" (1.6 m)   Wt 80.7 kg   SpO2 96%   BMI 31.52 kg/m  Vital signs in last 24 hours: Temp:  [97 F (36.1 C)-100.9 F (38.3 C)] 99 F (37.2 C) (10/01 1130) Pulse Rate:  [53-94] 64 (10/01 1130) Resp:  [5-24] 12 (10/01 1130) BP: (104-131)/(66-86) 131/86 (10/01 1130) SpO2:  [86 %-99 %] 96 % (10/01 1130) FiO2 (%):  [28 %-55 %] 55 % (10/01 1130) Weight:  [80.7 kg] 80.7 kg (10/01 0346)   Physical Exam  Constitutional: Appears well-developed and well-nourished.  Psych: Affect appropriate to situation Eyes: No scleral injection HENT: No OP  obstrucion MSK: no joint deformities.  Cardiovascular: Normal rate and regular rhythm.  Respiratory: Effort normal, non-labored breathing GI: Soft.  No distension. There is no tenderness.  Skin: WDI  Neuro: Mental Status: Patient awakens easily, she has nonpurposeful movements in the bed.  She does appear to fixate the examiner on two separate occasions, but does not reliably track. Cranial Nerves: II: She blinks to threat bilaterally.  Pupils are equal, round, and reactive to light.   III,IV, VI: She crosses midline in both directions. V: VII: Difficult to judge due to BiPAP. Motor: She moves all extremities relatively symmetrically, but does not follow commands.   Sensory: She withdraws to noxious stimulation  Cerebellar: Does not perform  I have reviewed labs in epic and the results pertinent to this consultation are: TSH 21  I have reviewed the images obtained: MRI is negative  Impression: 54 year old female with acute encephalopathy in the setting of Covid pneumonia.  Her markedly elevated TSH could be suggestive that she had subclinical hypothyroidism which can result in significant encephalopathy after acute physiological stress (which she is currently experiencing).  With concern for seizure, she needs an EEG, but it is unclear to me if this represents seizure versus myoclonus from description.  Agree with not starting antiepileptics at the current time unless EEG demonstrates a need.  No significant reason to suspect thiamine deficiency, but I will start repletion and check  level as well as this can certainly cause encephalopathy and the danger of not replating would be high.  Covid encephalopathy has also been repeatedly described and recovery can be quite prolonged.  Recommendations: 1) ammonia, free T4, thiamine level, B12 2) thyroid repletion 3) EEG 4) neurology will continue to follow   Roland Rack, MD [facility] [phone]  If 7pm- 7am,  please page neurology on call as listed in [place].

## 2020-02-01 NOTE — Progress Notes (Signed)
Inpatient Diabetes Program Recommendations  AACE/ADA: New Consensus Statement on Inpatient Glycemic Control (2015)  Target Ranges:  Prepandial:   less than 140 mg/dL      Peak postprandial:   less than 180 mg/dL (1-2 hours)      Critically ill patients:  140 - 180 mg/dL   Results for Marissa Cook, Marissa Cook (MRN [id number]) as of 02/01/2020 15:05  Ref. Range 01/31/2020 23:40 02/01/2020 03:39 02/01/2020 07:39 02/01/2020 12:19  Glucose-Capillary Latest Ref Range: 70 - 99 mg/dL 148 (H) 141 (H) 212 (H) 252 (H)    Home Diabetes meds: Tresiba 45 units q PM Metformin XR 1000 mg bid Novolog 8 units with meals plus Novolog correction Jardiance 25 mg daily   Current Insulin Orders: Levemir 25 units BID        Novolog 0-20 units Q4H     Decadron 6 mg Daily  Levemir increased yest--pt got total 45 units Levemir yest--will get total 50 units Levemir today.   Extubated this AM but still having AMS.  CBGs have somewhat improved since yesterday, however, elevated at 12pm today to 252 mg/dl.   MD- If patient's CBGs continue to stay >200 consistently,may consider increasing Levemir slightly more to 28 units BID    --Will follow patient during hospitalization--  Wyn Quaker RN, MSN, CDE Diabetes Coordinator Inpatient Glycemic Control Team Team Pager: [phone] (8a-5p)

## 2020-02-01 DEATH — deceased

## 2020-02-02 ENCOUNTER — Inpatient Hospital Stay: Payer: BC Managed Care – PPO

## 2020-02-02 DIAGNOSIS — U071 COVID-19: Principal | ICD-10-CM

## 2020-02-02 DIAGNOSIS — J9600 Acute respiratory failure, unspecified whether with hypoxia or hypercapnia: Secondary | ICD-10-CM

## 2020-02-02 DIAGNOSIS — R4182 Altered mental status, unspecified: Secondary | ICD-10-CM | POA: Diagnosis not present

## 2020-02-02 DIAGNOSIS — J9601 Acute respiratory failure with hypoxia: Secondary | ICD-10-CM

## 2020-02-02 DIAGNOSIS — G9341 Metabolic encephalopathy: Secondary | ICD-10-CM

## 2020-02-02 LAB — CBC WITH DIFFERENTIAL/PLATELET
Abs Immature Granulocytes: 0.24 10*3/uL — ABNORMAL HIGH (ref 0.00–0.07)
Basophils Absolute: 0 10*3/uL (ref 0.0–0.1)
Basophils Relative: 0 %
Eosinophils Absolute: 0 10*3/uL (ref 0.0–0.5)
Eosinophils Relative: 0 %
HCT: 40.5 % (ref 36.0–46.0)
Hemoglobin: 13.9 g/dL (ref 12.0–15.0)
Immature Granulocytes: 2 %
Lymphocytes Relative: 12 %
Lymphs Abs: 1.4 10*3/uL (ref 0.7–4.0)
MCH: 30.8 pg (ref 26.0–34.0)
MCHC: 34.3 g/dL (ref 30.0–36.0)
MCV: 89.8 fL (ref 80.0–100.0)
Monocytes Absolute: 0.8 10*3/uL (ref 0.1–1.0)
Monocytes Relative: 6 %
Neutro Abs: 9.2 10*3/uL — ABNORMAL HIGH (ref 1.7–7.7)
Neutrophils Relative %: 80 %
Platelets: 164 10*3/uL (ref 150–400)
RBC: 4.51 MIL/uL (ref 3.87–5.11)
RDW: 14.6 % (ref 11.5–15.5)
WBC: 11.7 10*3/uL — ABNORMAL HIGH (ref 4.0–10.5)
nRBC: 0 % (ref 0.0–0.2)

## 2020-02-02 LAB — COMPREHENSIVE METABOLIC PANEL
ALT: 22 U/L (ref 0–44)
AST: 55 U/L — ABNORMAL HIGH (ref 15–41)
Albumin: 2.6 g/dL — ABNORMAL LOW (ref 3.5–5.0)
Alkaline Phosphatase: 100 U/L (ref 38–126)
Anion gap: 8 (ref 5–15)
BUN: 13 mg/dL (ref 6–20)
CO2: 27 mmol/L (ref 22–32)
Calcium: 7.3 mg/dL — ABNORMAL LOW (ref 8.9–10.3)
Chloride: 106 mmol/L (ref 98–111)
Creatinine, Ser: 0.69 mg/dL (ref 0.44–1.00)
GFR calc Af Amer: >60 mL/min (ref >60)
GFR calc non Af Amer: >60 mL/min (ref >60)
Glucose, Bld: 89 mg/dL (ref 70–99)
Potassium: 3 mmol/L — ABNORMAL LOW (ref 3.5–5.1)
Sodium: 141 mmol/L (ref 135–145)
Total Bilirubin: 0.7 mg/dL (ref 0.3–1.2)
Total Protein: 5.8 g/dL — ABNORMAL LOW (ref 6.5–8.1)

## 2020-02-02 LAB — GLUCOSE, CAPILLARY
Glucose-Capillary: 119 mg/dL — ABNORMAL HIGH (ref 70–99)
Glucose-Capillary: 132 mg/dL — ABNORMAL HIGH (ref 70–99)
Glucose-Capillary: 134 mg/dL — ABNORMAL HIGH (ref 70–99)
Glucose-Capillary: 170 mg/dL — ABNORMAL HIGH (ref 70–99)
Glucose-Capillary: 186 mg/dL — ABNORMAL HIGH (ref 70–99)
Glucose-Capillary: 85 mg/dL (ref 70–99)

## 2020-02-02 LAB — CULTURE, RESPIRATORY W GRAM STAIN: Culture: NORMAL

## 2020-02-02 LAB — MAGNESIUM: Magnesium: 1.9 mg/dL (ref 1.7–2.4)

## 2020-02-02 LAB — PHOSPHORUS: Phosphorus: 1.9 mg/dL — ABNORMAL LOW (ref 2.5–4.6)

## 2020-02-02 LAB — AMMONIA: Ammonia: 68 umol/L — ABNORMAL HIGH (ref 9–35)

## 2020-02-02 MED ORDER — VECURONIUM BROMIDE 10 MG IV SOLR
INTRAVENOUS | Status: AC
  Filled 2020-02-02: qty 10

## 2020-02-02 MED ORDER — NOREPINEPHRINE 4 MG/250ML-% IV SOLN
INTRAVENOUS | Status: AC
  Filled 2020-02-02: qty 250

## 2020-02-02 MED ORDER — CHLORHEXIDINE GLUCONATE 0.12% ORAL RINSE (MEDLINE KIT)
15.0000 mL | Freq: Two times a day (BID) | OROMUCOSAL | Status: DC
  Administered 2020-02-02 – 2020-02-16 (×28): 15 mL via OROMUCOSAL

## 2020-02-02 MED ORDER — FENTANYL 2500MCG IN NS 250ML (10MCG/ML) PREMIX INFUSION
0.0000 ug/h | INTRAVENOUS | Status: DC
  Administered 2020-02-02: 200 ug/h via INTRAVENOUS
  Administered 2020-02-02 (×2): 300 ug/h via INTRAVENOUS
  Administered 2020-02-03: 375 ug/h via INTRAVENOUS
  Administered 2020-02-03 (×2): 350 ug/h via INTRAVENOUS
  Administered 2020-02-04 (×2): 375 ug/h via INTRAVENOUS
  Administered 2020-02-04 (×2): 400 ug/h via INTRAVENOUS
  Administered 2020-02-05: 200 ug/h via INTRAVENOUS
  Administered 2020-02-05: 40 ug/h via INTRAVENOUS
  Administered 2020-02-05: 400 ug/h via INTRAVENOUS
  Administered 2020-02-06: 200 ug/h via INTRAVENOUS
  Administered 2020-02-06 – 2020-02-09 (×4): 150 ug/h via INTRAVENOUS
  Administered 2020-02-09 – 2020-02-10 (×2): 100 ug/h via INTRAVENOUS
  Administered 2020-02-11: 150 ug/h via INTRAVENOUS
  Administered 2020-02-12 – 2020-02-13 (×2): 100 ug/h via INTRAVENOUS
  Administered 2020-02-14: 175 ug/h via INTRAVENOUS
  Administered 2020-02-15 – 2020-02-16 (×4): 300 ug/h via INTRAVENOUS
  Filled 2020-02-02 (×29): qty 250

## 2020-02-02 MED ORDER — PROPOFOL 1000 MG/100ML IV EMUL
5.0000 ug/kg/min | INTRAVENOUS | Status: DC
  Administered 2020-02-02: 15 ug/kg/min via INTRAVENOUS
  Administered 2020-02-02: 25 ug/kg/min via INTRAVENOUS
  Administered 2020-02-03: 35 ug/kg/min via INTRAVENOUS
  Administered 2020-02-03: 25 ug/kg/min via INTRAVENOUS
  Administered 2020-02-03: 25.01 ug/kg/min via INTRAVENOUS
  Administered 2020-02-04: 30 ug/kg/min via INTRAVENOUS
  Administered 2020-02-04 (×2): 40 ug/kg/min via INTRAVENOUS
  Administered 2020-02-05 (×2): 45 ug/kg/min via INTRAVENOUS
  Administered 2020-02-05: 20 ug/kg/min via INTRAVENOUS
  Administered 2020-02-05: 40 ug/kg/min via INTRAVENOUS
  Administered 2020-02-06 (×2): 20 ug/kg/min via INTRAVENOUS
  Administered 2020-02-07 – 2020-02-08 (×6): 30 ug/kg/min via INTRAVENOUS
  Filled 2020-02-02 (×22): qty 100

## 2020-02-02 MED ORDER — MIDAZOLAM HCL 2 MG/2ML IJ SOLN
1.0000 mg | INTRAMUSCULAR | Status: AC | PRN
  Administered 2020-02-04 – 2020-02-05 (×3): 2 mg via INTRAVENOUS
  Filled 2020-02-02 (×3): qty 2

## 2020-02-02 MED ORDER — LORAZEPAM 2 MG/ML IJ SOLN
INTRAMUSCULAR | Status: AC
  Administered 2020-02-02: 2 mg
  Filled 2020-02-02: qty 1

## 2020-02-02 MED ORDER — ORAL CARE MOUTH RINSE
15.0000 mL | OROMUCOSAL | Status: DC
  Administered 2020-02-02 – 2020-02-16 (×130): 15 mL via OROMUCOSAL

## 2020-02-02 MED ORDER — POLYETHYLENE GLYCOL 3350 17 G PO PACK
17.0000 g | PACK | Freq: Every day | ORAL | Status: DC
  Filled 2020-02-02: qty 1

## 2020-02-02 MED ORDER — DOCUSATE SODIUM 50 MG/5ML PO LIQD
100.0000 mg | Freq: Two times a day (BID) | ORAL | Status: DC
  Filled 2020-02-02: qty 10

## 2020-02-02 MED ORDER — VECURONIUM BROMIDE 10 MG IV SOLR
10.0000 mg | Freq: Once | INTRAVENOUS | Status: AC
  Administered 2020-02-02: 10 mg via INTRAVENOUS

## 2020-02-02 MED ORDER — FENTANYL 2500MCG IN NS 250ML (10MCG/ML) PREMIX INFUSION
INTRAVENOUS | Status: AC
  Administered 2020-02-02: 150 ug/h via INTRAVENOUS
  Filled 2020-02-02: qty 250

## 2020-02-02 MED ORDER — POTASSIUM CHLORIDE 20 MEQ PO PACK
60.0000 meq | PACK | Freq: Once | ORAL | Status: AC
  Administered 2020-02-02: 60 meq via NASOGASTRIC
  Filled 2020-02-02: qty 3

## 2020-02-02 MED ORDER — ETOMIDATE 2 MG/ML IV SOLN
20.0000 mg | Freq: Once | INTRAVENOUS | Status: AC
  Administered 2020-02-02: 20 mg via INTRAVENOUS

## 2020-02-02 MED ORDER — MIDAZOLAM HCL 2 MG/2ML IJ SOLN
4.0000 mg | Freq: Once | INTRAMUSCULAR | Status: AC
  Administered 2020-02-02: 4 mg via INTRAVENOUS

## 2020-02-02 MED ORDER — FENTANYL CITRATE (PF) 100 MCG/2ML IJ SOLN
200.0000 ug | Freq: Once | INTRAMUSCULAR | Status: AC
  Administered 2020-02-02: 200 ug via INTRAVENOUS

## 2020-02-02 MED ORDER — MIDAZOLAM HCL 2 MG/2ML IJ SOLN
1.0000 mg | INTRAMUSCULAR | Status: DC | PRN
  Administered 2020-02-06 – 2020-02-11 (×2): 2 mg via INTRAVENOUS
  Filled 2020-02-02: qty 2

## 2020-02-02 MED ORDER — FENTANYL BOLUS VIA INFUSION
50.0000 ug | INTRAVENOUS | Status: DC | PRN
  Administered 2020-02-07 – 2020-02-11 (×2): 50 ug via INTRAVENOUS
  Filled 2020-02-02: qty 50

## 2020-02-02 MED ORDER — POLYETHYLENE GLYCOL 3350 17 G PO PACK
17.0000 g | PACK | Freq: Every day | ORAL | Status: DC
  Administered 2020-02-02 – 2020-02-14 (×13): 17 g
  Filled 2020-02-02 (×12): qty 1

## 2020-02-02 MED ORDER — FENTANYL CITRATE (PF) 100 MCG/2ML IJ SOLN
50.0000 ug | Freq: Once | INTRAMUSCULAR | Status: DC
  Filled 2020-02-02: qty 2

## 2020-02-02 MED ORDER — DOCUSATE SODIUM 50 MG/5ML PO LIQD
100.0000 mg | Freq: Two times a day (BID) | ORAL | Status: DC
  Administered 2020-02-02 – 2020-02-14 (×22): 100 mg
  Filled 2020-02-02 (×21): qty 10

## 2020-02-02 NOTE — Progress Notes (Signed)
Name: Marissa Cook MRN: [id number] DOB: 09-24-1965    ADMISSION DATE:  01/23/2020 CONSULTATION DATE: 01/30/2020  REFERRING MD : Dr. Sidney Ace   CHIEF COMPLAINT: AMS   BRIEF PATIENT DESCRIPTION:  54 yo female admitted with acute metabolic encephalopathy, acute metabolic acidosis, DKA, and COVID-19 pneumonia   SIGNIFICANT EVENTS/STUDIES:  09/28: CT Head revealed no acute intracranial findings. Hypoattenuating focus in the right basal ganglia compatible with remote lacunar infarct versus prominent perivascular space as detailed on comparison. 09/28: CTA Chest revealed no pulmonary embolus or acute aortic syndrome. Multifocal, peripheral predominant ground glass opacities, most consistent with multifocal pneumonia, including viral pneumonia. 09/29: Pt admitted to stepdown unit per hospitalist team, however remained in the ER pending bed availability 09/29:  PCCM team consulted to assist with management of COVID pneumonia and acute metabolic encephalopathy 6/38:  Patient is weaned to 35% on ventilator, weaned off sedation with mentation slowly waking up but not following verbal communication yet. Will hold off on EEG for now due to SBT and awakening trial.  Some concern for possible seizure activity overnight, we are monitoring closely and will perform EEG if any re-emergence of symptoms is noted.  02/01/20- patient is liberated from MV, she still is encephalopathic its unclear if she had central event.  I discussed case with neurology and there is plan for repeat EEG.    HISTORY OF PRESENT ILLNESS:   This is a 54 yo female with a PMH of Acute Pancreatitis, Hypothyroidism, HLD, Type II Diabetes Mellitus, HTN, H Pylori, Depression, Anemia, DDD, and Fatty Liver Disease.  She presented to [facility] ER via EMS on 09/28 with altered mental status and c/o "not feeling well."  Upon arrival to the ER pt alert to self only.  She was prescribed azithromycin and prednisone due to acute respiratory failure by  her PCP on 09/26 while awaiting for final COVID-19 results.  In the ER lab results ruled pt in for DKA and metabolic acidosis, therefore insulin gtt initiated.  COVID-19 positive and CXR concerning for pulmonary edema vs. multifocal pneumonia.   CTA Chest negative for PE or acute aortic syndrome, but concerning for multifocal pneumonia.  CT Head negative for acute intracranial findings.  She was subsequently admitted to the stepdown unit per hospitalist team for additional workup and treatment, but remained in the ER pending bed availability.  PCCM team consulted on 09/29 to assist with management of COVID pneumonia and acute metabolic encephalopathy.    PAST MEDICAL HISTORY :   has a past medical history of Anxiety, DDD (degenerative disc disease), lumbar, Depression, Diabetes mellitus without complication ([place]), Hyperlipidemia, Hypothyroidism, and Pancreatitis.  has a past surgical history that includes Abdominal hysterectomy; Cystectomy; Colonoscopy (N/A, 01/15/2016); Esophagogastroduodenoscopy (egd) with propofol (N/A, 01/15/2016); and Anterior lat lumbar fusion (N/A, 02/21/2019). Prior to Admission medications   Medication Sig Start Date End Date Taking? Authorizing Provider  aspirin EC 81 MG tablet Take 81 mg by mouth daily. Swallow whole.   Yes [provider]  atorvastatin (LIPITOR) 80 MG tablet Take 1 tablet by mouth daily. 01/17/20 01/16/21 Yes [provider]  DULoxetine (CYMBALTA) 30 MG capsule Take 30 mg by mouth daily. 01/13/2020  Yes [provider]  empagliflozin (JARDIANCE) 25 MG TABS tablet Take 1 tablet by mouth daily. 01/17/20  Yes [provider]  gabapentin (NEURONTIN) 600 MG tablet Take 600 mg by mouth 3 (three) times daily. 01/11/20  Yes [provider]  insulin aspart (NOVOLOG) 100 UNIT/ML injection Inject 8 Units into the  skin 3 (three) times daily before meals. Plus sliding scale, MAX 80 units daily.   Yes [provider]  insulin  degludec (TRESIBA FLEXTOUCH) 100 UNIT/ML SOPN FlexTouch Pen Inject 45 Units into the skin at bedtime.    Yes [provider]  levothyroxine (SYNTHROID) 300 MCG tablet Take 300 mcg by mouth daily before breakfast.    Yes [provider]  linaclotide (LINZESS) 145 MCG CAPS capsule Take 1 capsule by mouth daily. 01/17/20  Yes [provider]  metFORMIN (GLUCOPHAGE-XR) 500 MG 24 hr tablet Take 2 tablets by mouth in the morning and at bedtime. 01/17/20 2020/03/07 Yes [provider]  metoprolol tartrate (LOPRESSOR) 50 MG tablet Take 1 tablet by mouth in the morning and at bedtime. 01/17/20  Yes [provider]  QUEtiapine (SEROQUEL) 50 MG tablet Take 100 mg by mouth at bedtime. 01/08/2020  Yes [provider]  cyclobenzaprine (FLEXERIL) 10 MG tablet Take 1 tablet (10 mg total) by mouth 3 (three) times daily as needed for muscle spasms. Patient not taking: Reported on 01/20/2020 02/22/19   Newman Pies, MD  docusate sodium (COLACE) 100 MG capsule Take 1 capsule (100 mg total) by mouth 2 (two) times daily. Patient not taking: Reported on 01/06/2020 02/22/19   Newman Pies, MD  oxyCODONE (OXY IR/ROXICODONE) 5 MG immediate release tablet Take 1 tablet (5 mg total) by mouth every 4 (four) hours as needed for moderate pain ((score 4 to 6)). Patient not taking: Reported on 01/21/2020 02/22/19   Newman Pies, MD   Allergies  Allergen Reactions  . Sulfasalazine     UNSPECIFIED REACTION     FAMILY HISTORY:  family history is not on file. SOCIAL HISTORY:  reports that she has never smoked. She has never used smokeless tobacco. She reports that she does not drink alcohol and does not use drugs.  REVIEW OF SYSTEMS:   Unable to assess pt confused   SUBJECTIVE:  Unable to assess pt confused   VITAL SIGNS: Temp:  [100 F (37.8 C)-101.7 F (38.7 C)] 100 F (37.8 C) (10/02 1330) Pulse Rate:  [52-89] 55 (10/02 1330) Resp:  [13-34] 27 (10/02  1330) BP: (128-157)/(76-113) 139/85 (10/02 1330) SpO2:  [82 %-99 %] 96 % (10/02 1330) FiO2 (%):  [60 %-100 %] 60 % (10/02 0829) Weight:  [82.3 kg] 82.3 kg (10/02 0500)  PHYSICAL EXAMINATION: General: acutely ill appearing female, NAD  Neuro: GCS3t HEENT: supple, no JVD  Cardiovascular: sinus rhythm, rrr, no R/G Lungs: diminished throughout, even, non labored  Abdomen: hypoactive BS X4, obese, soft  Musculoskeletal: normal bulk and tone, no edema  Skin: intact no rashes or lesions present   Recent Labs  Lab 01/31/20 0343 02/01/20 0348 02/02/20 0544  NA 139 144 141  K 4.0 3.6 3.0*  CL 114* 112* 106  CO2 16* 22 27  BUN 31* 22* 13  CREATININE 0.92 0.73 0.69  GLUCOSE 348* 155* 89   Recent Labs  Lab 01/31/20 0343 02/01/20 0348 02/02/20 0544  HGB 13.0 12.9 13.9  HCT 37.7 37.5 40.5  WBC 7.3 7.1 11.7*  PLT 156 161 164   EEG  Result Date: 02/01/2020 Lora Havens, MD     02/01/2020  5:19 PM Patient Name: NICOLINA HIRT MRN: [id number] Epilepsy Attending: Lora Havens Referring Physician/Provider: Dr Roland Rack Date: 02/01/2020 Duration: 20.37 mins Patient history:  54 year old female with acute encephalopathy in the setting of Covid pneumonia.  Of note, she has been evaluated by Dr. Melrose Nakayama with  neurology for two episodes of confusion in the past that were "like being drunk."  Since being admitted, she had an episode of some shaking associated with upward eye deviation that was concerning for seizure. EEG to evaluate for seizure.  Level of alertness: lethargic AEDs during EEG study: None Technical aspects: This EEG study was done with scalp electrodes positioned according to the 10-20 International system of electrode placement. Electrical activity was acquired at a sampling rate of 500Hz  and reviewed with a high frequency filter of 70Hz  and a low frequency filter of 1Hz . EEG data were recorded continuously and digitally stored. Description: EEG showed continuous  generalized 3 to 6 Hz theta-delta slowing. Hyperventilation and photic stimulation were not performed.   ABNORMALITY -Continuous slow, generalized IMPRESSION: This study is suggestive of severe diffuse encephalopathy, nonspecific etiology but could be related to sedation. No seizures or epileptiform discharges were seen throughout the recording. Lora Havens   DG Abd 1 View  Result Date: 02/02/2020 CLINICAL DATA:  NG tube placement EXAM: ABDOMEN - 1 VIEW COMPARISON:  01/30/2020 abdominal radiograph FINDINGS: Enteric tube terminates in the proximal stomach. No disproportionately dilated small bowel loops. No evidence of pneumatosis or pneumoperitoneum. Left lateral spinal fusion at L4-5. IMPRESSION: Enteric tube terminates in the proximal stomach. Electronically Signed   By: Ilona Sorrel M.D.   On: 02/02/2020 07:51   DG Chest Port 1 View  Result Date: 02/02/2020 CLINICAL DATA:  COVID positive, intubated EXAM: PORTABLE CHEST 1 VIEW COMPARISON:  01/31/2020 chest radiograph. FINDINGS: Endotracheal tube tip is 9.5 cm above the carina. Enteric tube enters stomach with the tip not seen on this image. Stable cardiomediastinal silhouette with normal heart size. No pneumothorax. No pleural effusion. Hazy patchy opacities in lungs bilaterally, most prominent in the lower lungs, similar. IMPRESSION: 1. Endotracheal tube tip is 9.5 cm above the carina, consider advancing 3 cm. 2. Stable hazy patchy opacities in both lungs, most prominent in the lower lungs, compatible with COVID-19 pneumonia. These results will be called to the ordering clinician or representative by the Radiologist Assistant, and communication documented in the PACS or [employer]. Electronically Signed   By: Ilona Sorrel M.D.   On: 02/02/2020 07:51    ASSESSMENT / PLAN:  Acute hypoxic respiratory failure secondary to COVID-19 pneumonia and acute metabolic acidosis  Prn supplemental O2 to maintain O2 sats 88% or higher Continue  remdesivir, vitamins, and iv steroids Trend inflammatory markers  Encourage self-proning as tolerated  Prn antitussives  Pulmonary hygiene   Diabetic Ketoacidosis  Severe metabolic acidosis in the setting of DKA  Continuous telemetry monitoring  Per ICU Intensivist recommendations transitioning pt off insulin gtt due to closure of anion gap-20 units lantus bid and SSI   Will start sodium bicarb gtt @125  ml/hr Repeat VBG and BMP pending  -9/30- increased levemir [place] to 25 bid   Possible seizure activity  - will hold keppra for now to allow patient to wake up for extubation   - if any seizure activity is noted please restart keppra and re-order eeg as well as neurology evaluation.   - reviewed plan with neurologist - plan for EEG.   Hypothyroidism  TSH level pending   Acute encephalopathy secondary to metabolic derangements and [id number]  Correct metabolic derangements  Frequent reorientation  Avoid sedating medications  If mentation does not improve following resolution of metabolic acidosis will need to consider MR Brain   Best Practice VTE px: subq lovenox SUP px: pepcid  Code status: Full  code   Critical care provider statement:    Critical care time (minutes):  33   Critical care time was exclusive of:  Separately billable procedures and  treating other patients   Critical care was necessary to treat or prevent imminent or  life-threatening deterioration of the following conditions:  acute hypoxemic respiratory failure , covid19 pneumonia, DKA.   Critical care was time spent personally by me on the following  activities:  Development of treatment plan with patient or surrogate,  discussions with consultants, evaluation of patient's response to  treatment, examination of patient, obtaining history from patient or  surrogate, ordering and performing treatments and interventions, ordering  and review of laboratory studies and re-evaluation of patient's condition   I  assumed direction of critical care for this patient from another  provider in my specialty: no

## 2020-02-02 NOTE — Progress Notes (Signed)
Subjective: Patient required re-intubation overnight.  Sedated on Precedex and Fentanyl.  Objective: Current vital signs: BP 137/83    Pulse 60    Temp (!) 101.3 F (38.5 C)    Resp (!) 25    Ht 5' 2.99" (1.6 m)    Wt 82.3 kg    SpO2 95%    BMI 32.15 kg/m  Vital signs in last 24 hours: Temp:  [98.6 F (37 C)-101.7 F (38.7 C)] 101.3 F (38.5 C) (10/02 1000) Pulse Rate:  [52-113] 60 (10/02 1000) Resp:  [11-34] 25 (10/02 1000) BP: (113-157)/(76-113) 137/83 (10/02 1000) SpO2:  [82 %-99 %] 95 % (10/02 1000) FiO2 (%):  [55 %-100 %] 60 % (10/02 0829) Weight:  [82.3 kg] 82.3 kg (10/02 0500)  Intake/Output from previous day: 10/01 0701 - 10/02 0700 In: 2541.5 [I.V.:1913.6; IV Piggyback:628] Out: 2255 [Urine:1755; Emesis/NG output:500] Intake/Output this shift: Total I/O In: 283.4 [I.V.:221.3; IV Piggyback:62] Out: 285 [Urine:285] Nutritional status:  Diet Order            Diet NPO time specified  Diet effective now                 Neurologic Exam: Mental Status: Intubated and sedated Cranial Nerves: Patient does not respond confrontation bilaterally, pupils equal and responsive to light bilaterally Motor: Moves all extremities  Lab Results: Basic Metabolic Panel: Recent Labs  Lab 01/30/20 0840 01/30/20 1420 01/30/20 1607 01/30/20 1607 01/30/20 2003 01/30/20 2003 01/31/20 0343 01/31/20 1922 02/01/20 0348 02/02/20 0544  NA 136   < > 141  --  136  --  139  --  144 141  K 4.1   < > 2.7*  --  4.6  --  4.0  --  3.6 3.0*  CL 110   < > 119*  --  113*  --  114*  --  112* 106  CO2 12*   < > 16*  --  11*  --  16*  --  22 27  GLUCOSE 241*   < > 137*  --  329*  --  348*  --  155* 89  BUN 25*   < > 21*  --  30*  --  31*  --  22* 13  CREATININE 0.98   < > 0.52  --  0.92  --  0.92  --  0.73 0.69  CALCIUM 7.8*   < > 6.7*   < > 7.2*   < > 7.4*  --  6.9* 7.3*  MG 2.1  --   --   --  2.0  --  2.0  --  1.9 1.9  PHOS 1.3*  --   --   --  1.2*  --  1.1* 2.5 1.9* 1.9*   < > =  values in this interval not displayed.    Liver Function Tests: Recent Labs  Lab 01/04/2020 2049 01/31/20 0343 02/01/20 0348 02/02/20 0544  AST 68* 51* 53* 55*  ALT 41 28 26 22   ALKPHOS 114 81 74 100  BILITOT 1.8* 0.9 1.2 0.7  PROT 8.0 6.3* 5.9* 5.8*  ALBUMIN 3.6 2.7* 2.5* 2.6*   Recent Labs  Lab 01/30/20 1607 01/31/20 1922  LIPASE 743* 130*   Recent Labs  Lab 02/01/20 1322 02/02/20 0844  AMMONIA 42* 68*    CBC: Recent Labs  Lab 01/14/2020 2049 01/30/20 1607 01/31/20 0343 02/01/20 0348 02/02/20 0544  WBC 12.7* 11.1* 7.3 7.1 11.7*  NEUTROABS 9.9* 9.6* 5.7 5.5 9.2*  HGB 15.8*  15.2* 13.0 12.9 13.9  HCT 47.2* 43.7 37.7 37.5 40.5  MCV 90.9 88.6 88.7 88.7 89.8  PLT 200 198 156 161 164    Cardiac Enzymes: No results for input(s): CKTOTAL, CKMB, CKMBINDEX, TROPONINI in the last 168 hours.  Lipid Panel: No results for input(s): CHOL, TRIG, HDL, CHOLHDL, VLDL, LDLCALC in the last 168 hours.  CBG: Recent Labs  Lab 02/01/20 1652 02/01/20 1950 02/01/20 2355 02/02/20 0407 02/02/20 0745  GLUCAP 148* 98 85 85 119*    Microbiology: Results for orders placed or performed during the hospital encounter of 01/15/2020  CULTURE, BLOOD (ROUTINE X 2) w Reflex to ID Panel     Status: None (Preliminary result)   Collection Time: 01/04/2020  8:49 PM   Specimen: BLOOD  Result Value Ref Range Status   Specimen Description BLOOD BLOOD LEFT ARM  Final   Special Requests   Final    BOTTLES DRAWN AEROBIC AND ANAEROBIC Blood Culture adequate volume   Culture   Final    NO GROWTH 3 DAYS Performed at [facility], [address] [place], [place] [address]    Report Status PENDING  Incomplete  Respiratory Panel by RT PCR (Flu A&B, Covid) - Nasopharyngeal Swab     Status: Abnormal   Collection Time: 01/27/2020  8:51 PM   Specimen: Nasopharyngeal Swab  Result Value Ref Range Status   SARS Coronavirus 2 by RT PCR POSITIVE (A) NEGATIVE Final    Comment: RESULT CALLED TO, READ  BACK BY AND VERIFIED WITH: CHRISSY BRAND @ 2337 01/06/2020 RH (NOTE) SARS-CoV-2 target nucleic acids are DETECTED.  SARS-CoV-2 RNA is generally detectable in upper respiratory specimens  during the acute phase of infection. Positive results are indicative of the presence of the identified virus, but do not rule out bacterial infection or co-infection with other pathogens not detected by the test. Clinical correlation with patient history and other diagnostic information is necessary to determine patient infection status. The expected result is Negative.  Fact Sheet for Patients:  PinkCheek.be  Fact Sheet for Healthcare Providers: GravelBags.it  This test is not yet approved or cleared by the [place] FDA and  has been authorized for detection and/or diagnosis of SARS-CoV-2 by FDA under an Emergency Use Authorization (EUA).  This EUA will remain in effect (meaning this test can be u sed) for the duration of  the COVID-19 declaration under Section 564(b)(1) of the Act, 21 U.S.C. section 360bbb-3(b)(1), unless the authorization is terminated or revoked sooner.      Influenza A by PCR NEGATIVE NEGATIVE Final   Influenza B by PCR NEGATIVE NEGATIVE Final    Comment: (NOTE) The Xpert Xpress SARS-CoV-2/FLU/RSV assay is intended as an aid in  the diagnosis of influenza from Nasopharyngeal swab specimens and  should not be used as a sole basis for treatment. Nasal washings and  aspirates are unacceptable for Xpert Xpress SARS-CoV-2/FLU/RSV  testing.  Fact Sheet for Patients: PinkCheek.be  Fact Sheet for Healthcare Providers: GravelBags.it  This test is not yet approved or cleared by the [place] FDA and  has been authorized for detection and/or diagnosis of SARS-CoV-2 by  FDA under an Emergency Use Authorization (EUA). This EUA will remain  in effect (meaning  this test can be used) for the duration of the  Covid-19 declaration under Section 564(b)(1) of the Act, 21  U.S.C. section 360bbb-3(b)(1), unless the authorization is  terminated or revoked. Performed at [facility], [address] [place], [place] [address]  CULTURE, BLOOD (ROUTINE X 2) w Reflex to ID Panel     Status: None (Preliminary result)   Collection Time: 01/22/2020  8:51 PM   Specimen: BLOOD  Result Value Ref Range Status   Specimen Description BLOOD RIGHT ANTECUBITAL  Final   Special Requests   Final    BOTTLES DRAWN AEROBIC AND ANAEROBIC Blood Culture adequate volume   Culture   Final    NO GROWTH 3 DAYS Performed at [facility], [address], [place], [place] [address]    Report Status PENDING  Incomplete  MRSA PCR Screening     Status: None   Collection Time: 01/30/20  5:49 PM   Specimen: Nasopharyngeal  Result Value Ref Range Status   MRSA by PCR NEGATIVE NEGATIVE Final    Comment:        The GeneXpert MRSA Assay (FDA approved for NASAL specimens only), is one component of a comprehensive MRSA colonization surveillance program. It is not intended to diagnose MRSA infection nor to guide or monitor treatment for MRSA infections. Performed at [facility], [address] [place], [place] [address]   Culture, respiratory     Status: None (Preliminary result)   Collection Time: 01/31/20  8:31 AM  Result Value Ref Range Status   Specimen Description   Final    EXPECTORATED SPUTUM Performed at [facility], [address], [place], [place] [address]    Special Requests   Final    NONE Performed at [facility], [address] [place], [place] [address]    Gram Stain   Final    FEW WBC PRESENT, PREDOMINANTLY PMN MODERATE GRAM POSITIVE COCCI RARE GRAM NEGATIVE RODS    Culture   Final    CULTURE REINCUBATED FOR BETTER GROWTH Performed at [facility], [place] [address], [place], [place] [address]     Report Status PENDING  Incomplete    Coagulation Studies: No results for input(s): LABPROT, INR in the last 72 hours.  Imaging: EEG  Result Date: 02/01/2020 Lora Havens, MD     02/01/2020  5:19 PM Patient Name: FERRIS TALLY MRN: [id number] Epilepsy Attending: Lora Havens Referring Physician/Provider: Dr Roland Rack Date: 02/01/2020 Duration: 20.37 mins Patient history:  54 year old female with acute encephalopathy in the setting of Covid pneumonia.  Of note, she has been evaluated by Dr. Melrose Nakayama with neurology for two episodes of confusion in the past that were "like being drunk."  Since being admitted, she had an episode of some shaking associated with upward eye deviation that was concerning for seizure. EEG to evaluate for seizure.  Level of alertness: lethargic AEDs during EEG study: None Technical aspects: This EEG study was done with scalp electrodes positioned according to the 10-20 International system of electrode placement. Electrical activity was acquired at a sampling rate of 500Hz  and reviewed with a high frequency filter of 70Hz  and a low frequency filter of 1Hz . EEG data were recorded continuously and digitally stored. Description: EEG showed continuous generalized 3 to 6 Hz theta-delta slowing. Hyperventilation and photic stimulation were not performed.   ABNORMALITY -Continuous slow, generalized IMPRESSION: This study is suggestive of severe diffuse encephalopathy, nonspecific etiology but could be related to sedation. No seizures or epileptiform discharges were seen throughout the recording. Lora Havens   DG Abd 1 View  Result Date: 02/02/2020 CLINICAL DATA:  NG tube placement EXAM: ABDOMEN - 1 VIEW COMPARISON:  01/30/2020 abdominal radiograph FINDINGS: Enteric tube terminates in the  proximal stomach. No disproportionately dilated small bowel loops. No evidence of pneumatosis or pneumoperitoneum. Left lateral spinal fusion at L4-5. IMPRESSION: Enteric tube  terminates in the proximal stomach. Electronically Signed   By: Ilona Sorrel M.D.   On: 02/02/2020 07:51   DG Chest Port 1 View  Result Date: 02/02/2020 CLINICAL DATA:  COVID positive, intubated EXAM: PORTABLE CHEST 1 VIEW COMPARISON:  01/31/2020 chest radiograph. FINDINGS: Endotracheal tube tip is 9.5 cm above the carina. Enteric tube enters stomach with the tip not seen on this image. Stable cardiomediastinal silhouette with normal heart size. No pneumothorax. No pleural effusion. Hazy patchy opacities in lungs bilaterally, most prominent in the lower lungs, similar. IMPRESSION: 1. Endotracheal tube tip is 9.5 cm above the carina, consider advancing 3 cm. 2. Stable hazy patchy opacities in both lungs, most prominent in the lower lungs, compatible with COVID-19 pneumonia. These results will be called to the ordering clinician or representative by the Radiologist Assistant, and communication documented in the PACS or [employer]. Electronically Signed   By: Ilona Sorrel M.D.   On: 02/02/2020 07:51    Medications:  I have reviewed the patient's current medications. Scheduled:  vitamin C  500 mg Per Tube Daily   chlorhexidine gluconate (MEDLINE KIT)  15 mL Mouth Rinse BID   Chlorhexidine Gluconate Cloth  6 each Topical Daily   cholecalciferol  1,000 Units Per Tube Daily   dexamethasone (DECADRON) injection  6 mg Intravenous Q24H   docusate  100 mg Per Tube BID   enoxaparin (LOVENOX) injection  40 mg Subcutaneous Q24H   fentaNYL (SUBLIMAZE) injection  50 mcg Intravenous Once   insulin aspart  0-20 Units Subcutaneous Q4H   insulin detemir  25 Units Subcutaneous BID   levothyroxine  300 mcg Per Tube Q0600   mouth rinse  15 mL Mouth Rinse 10 times per day   polyethylene glycol  17 g Per Tube Daily   QUEtiapine  100 mg Per Tube QHS   sodium chloride flush  10-40 mL Intracatheter Q12H   thiamine injection  100 mg Intravenous Daily   zinc sulfate  220 mg Per Tube Daily     Assessment/Plan: 54 year old female with acute encephalopathy in the setting of Covid pneumonia.  Her markedly elevated TSH could be suggestive that she had subclinical hypothyroidism which can result in significant encephalopathy after acute physiological stress (which she is currently experiencing).  EEG shows general background slowing but no epileptiform activity.  B12 normal, Ammonia mildly elevated with increasing LFT's. Thiamine pending.   Would continue to address medical issues with the thought that they are likely complicating an underlying COVID encephalopathy therefore recovery may be quite prolonged.  Recommendations: 1. Agree with treating thyroid abnormalities 2. No anticonvulsant therapy at this time 3. Would continue to follow ammonia and LFT's   4. Neurology will continue to follow prn.     LOS: 3 days   Alexis Goodell, MD Neurology [phone] 02/02/2020  10:46 AM

## 2020-02-02 NOTE — Procedures (Signed)
Intubation Procedure Note  KARAH CARUTHERS  [id number]  07-20-1965  Date:02/02/20  Time:6:38 AM   Provider Performing:Magddalene S Tukov-Yual    Procedure: Intubation (70017)  Indication(s) Respiratory Failure with hpoximia  Consent Unable to obtain consent due to emergent nature of procedure.   Anesthesia Etomidate, Versed, Fentanyl and Vecuronium   Time Out Verified patient identification, verified procedure, site/side was marked, verified correct patient position, special equipment/implants available, medications/allergies/relevant history reviewed, required imaging and test results available.   Sterile Technique Usual hand hygeine, masks, and gloves were used   Procedure Description Patient positioned in bed supine.  Sedation given as noted above.  Patient was intubated with endotracheal tube using Glidescope.  View was Grade 1 full glottis .  Number of attempts was 1.  Colorimetric CO2 detector was consistent with tracheal placement.   Complications/Tolerance None; patient tolerated the procedure well. Chest X-ray is ordered to verify placement.   EBL Minimal   Specimen(s) None   Procedure performed under direct supervision MD.. Ultrasound utilized for realtime vessel cannulation`   [place] Lemaster S. [facility] ANP-BC Pulmonary and Critical Care Medicine [facility] Pager [phone] or [phone]   NB: This document was prepared using Dragon voice recognition software and may include unintentional dictation errors.

## 2020-02-02 NOTE — Progress Notes (Signed)
Patient noted with increased respiratory rate in the 50s. Desatted into the 65s. With 100% FiO2 in BiPAP. And increased agitation and anxiety with combative behaviors. Provider called to room, ICU RNs and RTs called to assist. RSI completed. Patient is now intubated and on fentanyl drip added. PRN tylenol supp administered for temp greater than 101. Spouse Merry Proud made aware by provider.

## 2020-02-02 NOTE — Progress Notes (Signed)
Patient was intermittently agitated, tachypneic with episodes of hypoxia overnight. Was on maximum dose of precedex yet she was reaching out for the BiPAP mask and trying to get out of bed. During one of such episodes, she became acutely hypoxic and her SPO2 dropped to 13%. BMV initiated and patient emergently intubated. Patient had lost of secretions in her airway with mild airway edema.  Medications use: versed 4mg , fentanyl 200mg , etomidate 20mg  and vecuronium 10mg . SPO2 improved to >90% post intubated. BP remained stable. Fentanyl gtte amd prn versed ordered for sedation and vent discomfort. CXR and ABG post intubation pending.   Husband notified before and after   Hadli Vandemark S. [facility] ANP-BC Pulmonary and Critical Care Medicine [facility] Pager [phone] or [phone]  NB: This document was prepared using Dragon voice recognition software and may include unintentional dictation errors.

## 2020-02-03 DIAGNOSIS — G9341 Metabolic encephalopathy: Secondary | ICD-10-CM | POA: Diagnosis not present

## 2020-02-03 LAB — COMPREHENSIVE METABOLIC PANEL
ALT: 18 U/L (ref 0–44)
AST: 41 U/L (ref 15–41)
Albumin: 2.4 g/dL — ABNORMAL LOW (ref 3.5–5.0)
Alkaline Phosphatase: 96 U/L (ref 38–126)
Anion gap: 13 (ref 5–15)
BUN: 15 mg/dL (ref 6–20)
CO2: 21 mmol/L — ABNORMAL LOW (ref 22–32)
Calcium: 7 mg/dL — ABNORMAL LOW (ref 8.9–10.3)
Chloride: 107 mmol/L (ref 98–111)
Creatinine, Ser: 0.63 mg/dL (ref 0.44–1.00)
GFR calc Af Amer: >60 mL/min (ref >60)
GFR calc non Af Amer: >60 mL/min (ref >60)
Glucose, Bld: 101 mg/dL — ABNORMAL HIGH (ref 70–99)
Potassium: 3.6 mmol/L (ref 3.5–5.1)
Sodium: 141 mmol/L (ref 135–145)
Total Bilirubin: 0.9 mg/dL (ref 0.3–1.2)
Total Protein: 5.6 g/dL — ABNORMAL LOW (ref 6.5–8.1)

## 2020-02-03 LAB — BLOOD GAS, ARTERIAL
Acid-base deficit: 0.1 mmol/L (ref 0.0–2.0)
Bicarbonate: 26.8 mmol/L (ref 20.0–28.0)
FIO2: 1
MECHVT: 500 mL
O2 Saturation: 76.5 %
PEEP: 8 cmH2O
Patient temperature: 37
RATE: 22 resp/min
pCO2 arterial: 52 mmHg — ABNORMAL HIGH (ref 32.0–48.0)
pH, Arterial: 7.32 — ABNORMAL LOW (ref 7.350–7.450)
pO2, Arterial: 45 mmHg — ABNORMAL LOW (ref 83.0–108.0)

## 2020-02-03 LAB — CBC WITH DIFFERENTIAL/PLATELET
Abs Immature Granulocytes: 0.24 10*3/uL — ABNORMAL HIGH (ref 0.00–0.07)
Basophils Absolute: 0.1 10*3/uL (ref 0.0–0.1)
Basophils Relative: 0 %
Eosinophils Absolute: 0 10*3/uL (ref 0.0–0.5)
Eosinophils Relative: 0 %
HCT: 40.8 % (ref 36.0–46.0)
Hemoglobin: 13.4 g/dL (ref 12.0–15.0)
Immature Granulocytes: 2 %
Lymphocytes Relative: 8 %
Lymphs Abs: 1.3 10*3/uL (ref 0.7–4.0)
MCH: 30.1 pg (ref 26.0–34.0)
MCHC: 32.8 g/dL (ref 30.0–36.0)
MCV: 91.7 fL (ref 80.0–100.0)
Monocytes Absolute: 0.7 10*3/uL (ref 0.1–1.0)
Monocytes Relative: 5 %
Neutro Abs: 13.6 10*3/uL — ABNORMAL HIGH (ref 1.7–7.7)
Neutrophils Relative %: 85 %
Platelets: 144 10*3/uL — ABNORMAL LOW (ref 150–400)
RBC: 4.45 MIL/uL (ref 3.87–5.11)
RDW: 14.9 % (ref 11.5–15.5)
WBC: 15.9 10*3/uL — ABNORMAL HIGH (ref 4.0–10.5)
nRBC: 0.1 % (ref 0.0–0.2)

## 2020-02-03 LAB — GLUCOSE, CAPILLARY
Glucose-Capillary: 100 mg/dL — ABNORMAL HIGH (ref 70–99)
Glucose-Capillary: 68 mg/dL — ABNORMAL LOW (ref 70–99)
Glucose-Capillary: 111 mg/dL — ABNORMAL HIGH (ref 70–99)
Glucose-Capillary: 145 mg/dL — ABNORMAL HIGH (ref 70–99)
Glucose-Capillary: 123 mg/dL — ABNORMAL HIGH (ref 70–99)

## 2020-02-03 LAB — MAGNESIUM: Magnesium: 2.1 mg/dL (ref 1.7–2.4)

## 2020-02-03 LAB — PHOSPHORUS: Phosphorus: 2.4 mg/dL — ABNORMAL LOW (ref 2.5–4.6)

## 2020-02-03 MED ORDER — PHENYLEPHRINE HCL-NACL 10-0.9 MG/250ML-% IV SOLN
25.0000 ug/min | INTRAVENOUS | Status: DC
  Administered 2020-02-03: 25 ug/min via INTRAVENOUS
  Administered 2020-02-04: 15 ug/min via INTRAVENOUS
  Administered 2020-02-05: 40 ug/min via INTRAVENOUS
  Administered 2020-02-05: 45 ug/min via INTRAVENOUS
  Administered 2020-02-05: 25 ug/min via INTRAVENOUS
  Administered 2020-02-06: 15 ug/min via INTRAVENOUS
  Filled 2020-02-03 (×7): qty 250

## 2020-02-03 MED ORDER — METOPROLOL TARTRATE 25 MG PO TABS
12.5000 mg | ORAL_TABLET | Freq: Two times a day (BID) | ORAL | Status: DC
  Administered 2020-02-03: 12.5 mg via ORAL
  Filled 2020-02-03: qty 1

## 2020-02-03 MED ORDER — CHLORHEXIDINE GLUCONATE 0.12 % MT SOLN
OROMUCOSAL | Status: AC
  Administered 2020-02-03: 15 mL via OROMUCOSAL
  Filled 2020-02-03: qty 15

## 2020-02-03 MED ORDER — STERILE WATER FOR INJECTION IJ SOLN
INTRAMUSCULAR | Status: AC
  Administered 2020-02-03: 10 mL
  Filled 2020-02-03: qty 10

## 2020-02-03 MED ORDER — ENOXAPARIN SODIUM 40 MG/0.4ML ~~LOC~~ SOLN
40.0000 mg | SUBCUTANEOUS | Status: DC
  Administered 2020-02-05 – 2020-02-15 (×12): 40 mg via SUBCUTANEOUS
  Filled 2020-02-03 (×12): qty 0.4

## 2020-02-03 MED ORDER — METOPROLOL TARTRATE 25 MG PO TABS
12.5000 mg | ORAL_TABLET | Freq: Two times a day (BID) | ORAL | Status: DC
  Administered 2020-02-03: 12.5 mg
  Filled 2020-02-03 (×2): qty 1

## 2020-02-03 MED ORDER — VECURONIUM BROMIDE 10 MG IV SOLR
INTRAVENOUS | Status: AC
  Administered 2020-02-03: 10 mg
  Filled 2020-02-03: qty 10

## 2020-02-03 MED ORDER — SODIUM CHLORIDE 0.9 % IV SOLN
25.0000 ug/min | INTRAVENOUS | Status: DC
  Filled 2020-02-03: qty 1

## 2020-02-03 MED ORDER — SODIUM CHLORIDE 0.9 % IV SOLN
250.0000 mL | INTRAVENOUS | Status: DC
  Administered 2020-02-03 – 2020-02-15 (×2): 250 mL via INTRAVENOUS

## 2020-02-03 MED ORDER — SODIUM CHLORIDE 0.9 % IV BOLUS
1000.0000 mL | Freq: Once | INTRAVENOUS | Status: AC
  Administered 2020-02-03: 1000 mL via INTRAVENOUS

## 2020-02-03 MED ORDER — FUROSEMIDE 10 MG/ML IJ SOLN
40.0000 mg | Freq: Once | INTRAMUSCULAR | Status: AC
  Administered 2020-02-04: 40 mg via INTRAVENOUS
  Filled 2020-02-03: qty 4

## 2020-02-03 MED ORDER — DEXTROSE 5 % IV SOLN
10.0000 mg/kg | Freq: Three times a day (TID) | INTRAVENOUS | Status: DC
  Administered 2020-02-03 – 2020-02-07 (×12): 815 mg via INTRAVENOUS
  Filled 2020-02-03 (×2): qty 16.3
  Filled 2020-02-03: qty 10
  Filled 2020-02-03 (×11): qty 16.3

## 2020-02-03 MED ORDER — VECURONIUM BROMIDE 10 MG IV SOLR
10.0000 mg | INTRAVENOUS | Status: DC | PRN
  Administered 2020-02-03 – 2020-02-04 (×5): 10 mg via INTRAVENOUS
  Filled 2020-02-03 (×6): qty 10

## 2020-02-03 NOTE — Consult Note (Signed)
Pharmacy Antibiotic Note  Marissa Cook is a 54 y.o. female admitted on 01/18/2020 with possibe herpes encephalitis.    Pharmacy has been consulted for acyclovir dosing (empiric treatment pending LP).  Plan: Will start Acyclovir 815mg  q8h (based on 10mg /kg q8 per CrCL > 50 ml/min)  Height: 5' 2.99" (160 cm) Weight: 81.5 kg (179 lb 10.8 oz) IBW/kg (Calculated) : 52.38  Temp (24hrs), Avg:99.4 F (37.4 C), Min:98.2 F (36.8 C), Max:100.6 F (38.1 C)  Recent Labs  Lab 01/07/2020 2049 01/27/2020 2049 01/30/20 0341 01/30/20 0840 01/30/20 1607 01/30/20 1607 01/30/20 2003 01/31/20 0343 02/01/20 0348 02/02/20 0544 02/03/20 0807  WBC 12.7*   < >  --   --  11.1*  --   --  7.3 7.1 11.7* 15.9*  CREATININE 1.19*   < > 0.93   < > 0.52   < > 0.92 0.92 0.73 0.69 0.63  LATICACIDVEN 2.7*  --  2.3*  --   --   --   --   --   --   --   --    < > = values in this interval not displayed.    Estimated Creatinine Clearance: 84.1 mL/min (by C-G formula based on SCr of 0.63 mg/dL).    Allergies  Allergen Reactions  . Sulfasalazine     UNSPECIFIED REACTION     Antimicrobials this admission: remdesivir 9/29>10/03  Acyclovir 10/03 >>  Dose adjustments this admission: None  Microbiology results:  9/28 BCx: NG x 4 days  9/30 Sputum: moderate GPC, rare GNR  COVID + this admission    Thank you for allowing pharmacy to be a part of this patient's care.  Lu Duffel, PharmD, BCPS Clinical Pharmacist 02/03/2020 5:07 PM

## 2020-02-03 NOTE — Progress Notes (Signed)
Subjective: Patient was apparently moving too much to have a PICC line placed earlier, and therefore received paralytic.  Exam: Vitals:   02/03/20 1123 02/03/20 1200  BP: (!) 143/73 132/72  Pulse: (!) 137 (!) 126  Resp:  (!) 22  Temp:  (!) 100.4 F (38 C)  SpO2:  96%   Gen: In bed, NAD Resp: non-labored breathing, no acute distress Abd: soft, nt  Neuro: MS: Does not open eyes follow commands, she does grimace to noxious stimulation CN: Pupils are reactive bilaterally, corneals intact Motor: No movement to noxious stimulation in her extremities Sensory: As above Neck is supple.   Pertinent Labs: Ammonia initially 46, then 68 Creatinine 0.6 Free T4 low at 0.3  Impression: 54 year old female with acute encephalopathy in the setting of Covid and hypothyroidism. Her markedly elevated TSH could be suggestive that she had subclinical hypothyroidism which can result in significant encephalopathy after acute physiological stress (which she is currently experiencing).  Though there was concern for some seizure-like activity, the description sounds more consistent with myoclonus, and EEG was negative.  Though my suspicion is low, with her continued fevers, and possible worsening on exam today, I do think consideration of LP would be reasonable.  Recommendations: 1) consider LP for cells, glucose, protein, HSV 2) continue treatment of underlying medical issues per CCM. 3) neurology to continue to follow  Roland Rack, MD [facility] [phone]  If 7pm- 7am, please page neurology on call as listed in [place].

## 2020-02-03 NOTE — Progress Notes (Signed)
Received VAST consult to place midline.Patient currently has 3 working PIVs. Patient receiving Diprivan, Fentanyl and Precedex at this time.Also new order for IV Neosynephrine has been added. Notified Dr.Aleskerov via secure chat that midline not appropriate for these IV medicines and suggested Central/PICC line. MD still requested midline. Assessed patient for midline but no suitable veins found for midline.Notified primary RN.

## 2020-02-03 NOTE — Progress Notes (Signed)
Given that we will not be able to get an LP until tomorrow due to lovenox, I Will empirically treat with acyclovir. No signs suggesting meningitis and I think that the likelihood of that is low enough that I would not favor empiric antibacterials, but will cover with acyclovir, can stop if no signs of infection on CSF.  Roland Rack, MD [facility] [phone]  If 7pm- 7am, please page neurology on call as listed in [place].

## 2020-02-03 NOTE — Progress Notes (Signed)
Name: Marissa Cook MRN: [id number] DOB: 08-May-1965    ADMISSION DATE:  01/24/2020 CONSULTATION DATE: 01/30/2020  REFERRING MD : Dr. Sidney Ace   CHIEF COMPLAINT: AMS   BRIEF PATIENT DESCRIPTION:  54 yo female admitted with acute metabolic encephalopathy, acute metabolic acidosis, DKA, and COVID-19 pneumonia   SIGNIFICANT EVENTS/STUDIES:  09/28: CT Head revealed no acute intracranial findings. Hypoattenuating focus in the right basal ganglia compatible with remote lacunar infarct versus prominent perivascular space as detailed on comparison. 09/28: CTA Chest revealed no pulmonary embolus or acute aortic syndrome. Multifocal, peripheral predominant ground glass opacities, most consistent with multifocal pneumonia, including viral pneumonia. 09/29: Pt admitted to stepdown unit per hospitalist team, however remained in the ER pending bed availability 09/29:  PCCM team consulted to assist with management of COVID pneumonia and acute metabolic encephalopathy 1/93:  Patient is weaned to 35% on ventilator, weaned off sedation with mentation slowly waking up but not following verbal communication yet. Will hold off on EEG for now due to SBT and awakening trial.  Some concern for possible seizure activity overnight, we are monitoring closely and will perform EEG if any re-emergence of symptoms is noted.  02/01/20- patient is liberated from MV, she still is encephalopathic its unclear if she had central event.  I discussed case with neurology and there is plan for repeat EEG.   10/3- patient got re-intubated overnight due to encephalopathy which seems to be chronic recurrent.  I have discussed with husband most recent events and care plan  HISTORY OF PRESENT ILLNESS:   This is a 54 yo female with a PMH of Acute Pancreatitis, Hypothyroidism, HLD, Type II Diabetes Mellitus, HTN, H Pylori, Depression, Anemia, DDD, and Fatty Liver Disease.  She presented to [facility] ER via EMS on 09/28 with altered mental  status and c/o "not feeling well."  Upon arrival to the ER pt alert to self only.  She was prescribed azithromycin and prednisone due to acute respiratory failure by her PCP on 09/26 while awaiting for final COVID-19 results.  In the ER lab results ruled pt in for DKA and metabolic acidosis, therefore insulin gtt initiated.  COVID-19 positive and CXR concerning for pulmonary edema vs. multifocal pneumonia.   CTA Chest negative for PE or acute aortic syndrome, but concerning for multifocal pneumonia.  CT Head negative for acute intracranial findings.  She was subsequently admitted to the stepdown unit per hospitalist team for additional workup and treatment, but remained in the ER pending bed availability.  PCCM team consulted on 09/29 to assist with management of COVID pneumonia and acute metabolic encephalopathy.    PAST MEDICAL HISTORY :   has a past medical history of Anxiety, DDD (degenerative disc disease), lumbar, Depression, Diabetes mellitus without complication ([place]), Hyperlipidemia, Hypothyroidism, and Pancreatitis.  has a past surgical history that includes Abdominal hysterectomy; Cystectomy; Colonoscopy (N/A, 01/15/2016); Esophagogastroduodenoscopy (egd) with propofol (N/A, 01/15/2016); and Anterior lat lumbar fusion (N/A, 02/21/2019). Prior to Admission medications   Medication Sig Start Date End Date Taking? Authorizing Provider  aspirin EC 81 MG tablet Take 81 mg by mouth daily. Swallow whole.   Yes [provider]  atorvastatin (LIPITOR) 80 MG tablet Take 1 tablet by mouth daily. 01/17/20 01/16/21 Yes [provider]  DULoxetine (CYMBALTA) 30 MG capsule Take 30 mg by mouth daily. 01/12/2020  Yes [provider]  empagliflozin (JARDIANCE) 25 MG TABS tablet Take 1 tablet by mouth daily. 01/17/20  Yes [provider]  gabapentin (NEURONTIN) 600 MG tablet Take  600 mg by mouth 3 (three) times daily. 01/11/20  Yes [provider]  insulin aspart (NOVOLOG) 100  UNIT/ML injection Inject 8 Units into the skin 3 (three) times daily before meals. Plus sliding scale, MAX 80 units daily.   Yes [provider]  insulin degludec (TRESIBA FLEXTOUCH) 100 UNIT/ML SOPN FlexTouch Pen Inject 45 Units into the skin at bedtime.    Yes [provider]  levothyroxine (SYNTHROID) 300 MCG tablet Take 300 mcg by mouth daily before breakfast.    Yes [provider]  linaclotide (LINZESS) 145 MCG CAPS capsule Take 1 capsule by mouth daily. 01/17/20  Yes [provider]  metFORMIN (GLUCOPHAGE-XR) 500 MG 24 hr tablet Take 2 tablets by mouth in the morning and at bedtime. 01/17/20 02/19/2020 Yes [provider]  metoprolol tartrate (LOPRESSOR) 50 MG tablet Take 1 tablet by mouth in the morning and at bedtime. 01/17/20  Yes [provider]  QUEtiapine (SEROQUEL) 50 MG tablet Take 100 mg by mouth at bedtime. 01/05/2020  Yes [provider]  cyclobenzaprine (FLEXERIL) 10 MG tablet Take 1 tablet (10 mg total) by mouth 3 (three) times daily as needed for muscle spasms. Patient not taking: Reported on 01/14/2020 02/22/19   Newman Pies, MD  docusate sodium (COLACE) 100 MG capsule Take 1 capsule (100 mg total) by mouth 2 (two) times daily. Patient not taking: Reported on 01/06/2020 02/22/19   Newman Pies, MD  oxyCODONE (OXY IR/ROXICODONE) 5 MG immediate release tablet Take 1 tablet (5 mg total) by mouth every 4 (four) hours as needed for moderate pain ((score 4 to 6)). Patient not taking: Reported on 01/05/2020 02/22/19   Newman Pies, MD   Allergies  Allergen Reactions  . Sulfasalazine     UNSPECIFIED REACTION     FAMILY HISTORY:  family history is not on file. SOCIAL HISTORY:  reports that she has never smoked. She has never used smokeless tobacco. She reports that she does not drink alcohol and does not use drugs.  REVIEW OF SYSTEMS:   Unable to assess pt confused   SUBJECTIVE:  Unable to assess pt confused    VITAL SIGNS: Temp:  [98.2 F (36.8 C)-100.6 F (38.1 C)] 100.4 F (38 C) (10/03 1500) Pulse Rate:  [54-137] 106 (10/03 1500) Resp:  [16-26] 22 (10/03 1500) BP: (73-155)/(26-78) 121/71 (10/03 1500) SpO2:  [88 %-100 %] 91 % (10/03 1500) FiO2 (%):  [60 %-100 %] 100 % (10/03 1438) Weight:  [81.5 kg] 81.5 kg (10/03 0500)  PHYSICAL EXAMINATION: General: acutely ill appearing female, NAD  Neuro: GCS3t HEENT: supple, no JVD  Cardiovascular: sinus rhythm, rrr, no R/G Lungs: diminished throughout, even, non labored  Abdomen: hypoactive BS X4, obese, soft  Musculoskeletal: normal bulk and tone, no edema  Skin: intact no rashes or lesions present   Recent Labs  Lab 02/01/20 0348 02/02/20 0544 02/03/20 0807  NA 144 141 141  K 3.6 3.0* 3.6  CL 112* 106 107  CO2 22 27 21*  BUN 22* 13 15  CREATININE 0.73 0.69 0.63  GLUCOSE 155* 89 101*   Recent Labs  Lab 02/01/20 0348 02/02/20 0544 02/03/20 0807  HGB 12.9 13.9 13.4  HCT 37.5 40.5 40.8  WBC 7.1 11.7* 15.9*  PLT 161 164 144*   DG Abd 1 View  Result Date: 02/02/2020 CLINICAL DATA:  NG tube placement EXAM: ABDOMEN - 1 VIEW COMPARISON:  01/30/2020 abdominal radiograph FINDINGS: Enteric tube terminates in the proximal stomach. No disproportionately dilated small bowel loops.  No evidence of pneumatosis or pneumoperitoneum. Left lateral spinal fusion at L4-5. IMPRESSION: Enteric tube terminates in the proximal stomach. Electronically Signed   By: Ilona Sorrel M.D.   On: 02/02/2020 07:51   DG Chest Port 1 View  Result Date: 02/02/2020 CLINICAL DATA:  COVID positive, intubated EXAM: PORTABLE CHEST 1 VIEW COMPARISON:  01/31/2020 chest radiograph. FINDINGS: Endotracheal tube tip is 9.5 cm above the carina. Enteric tube enters stomach with the tip not seen on this image. Stable cardiomediastinal silhouette with normal heart size. No pneumothorax. No pleural effusion. Hazy patchy opacities in lungs bilaterally, most prominent in the lower  lungs, similar. IMPRESSION: 1. Endotracheal tube tip is 9.5 cm above the carina, consider advancing 3 cm. 2. Stable hazy patchy opacities in both lungs, most prominent in the lower lungs, compatible with COVID-19 pneumonia. These results will be called to the ordering clinician or representative by the Radiologist Assistant, and communication documented in the PACS or [employer]. Electronically Signed   By: Ilona Sorrel M.D.   On: 02/02/2020 07:51    ASSESSMENT / PLAN:  Acute hypoxic respiratory failure secondary to COVID-19 pneumonia and acute metabolic acidosis  Prn supplemental O2 to maintain O2 sats 88% or higher Continue remdesivir, vitamins, and iv steroids Trend inflammatory markers  Encourage self-proning as tolerated  Prn antitussives  Pulmonary hygiene   Diabetic Ketoacidosis  Severe metabolic acidosis in the setting of DKA  Continuous telemetry monitoring  Per ICU Intensivist recommendations transitioning pt off insulin gtt due to closure of anion gap-20 units lantus bid and SSI   Will start sodium bicarb gtt @125  ml/hr Repeat VBG and BMP pending  -9/30- increased levemir [place] to 25 bid  Altered mental status with encephalopathy  -neurology on case - appreciate input  - acyclovir started  - possible LP   - thyroid workup  - patient on seroquel from home  - reviewed plan with neurologist - plan for EEG.   Hypothyroidism  TSH - patient on synthroid 300 per neurology   Acute encephalopathy secondary to metabolic derangements and [id number]  Correct metabolic derangements  Frequent reorientation    Best Practice VTE px: subq lovenox SUP px: pepcid  Code status: Full code   Critical care provider statement:    Critical care time (minutes):  33   Critical care time was exclusive of:  Separately billable procedures and  treating other patients   Critical care was necessary to treat or prevent imminent or  life-threatening deterioration of the following  conditions:  acute hypoxemic respiratory failure , covid19 pneumonia, DKA.   Critical care was time spent personally by me on the following  activities:  Development of treatment plan with patient or surrogate,  discussions with consultants, evaluation of patient's response to  treatment, examination of patient, obtaining history from patient or  surrogate, ordering and performing treatments and interventions, ordering  and review of laboratory studies and re-evaluation of patient's condition   I assumed direction of critical care for this patient from another  provider in my specialty: no

## 2020-02-04 ENCOUNTER — Inpatient Hospital Stay: Payer: BC Managed Care – PPO

## 2020-02-04 DIAGNOSIS — R4182 Altered mental status, unspecified: Secondary | ICD-10-CM

## 2020-02-04 LAB — PHOSPHORUS: Phosphorus: 3.6 mg/dL (ref 2.5–4.6)

## 2020-02-04 LAB — CULTURE, BLOOD (ROUTINE X 2)
Culture: NO GROWTH
Culture: NO GROWTH
Special Requests: ADEQUATE
Special Requests: ADEQUATE

## 2020-02-04 LAB — COMPREHENSIVE METABOLIC PANEL
ALT: 18 U/L (ref 0–44)
AST: 43 U/L — ABNORMAL HIGH (ref 15–41)
Albumin: 2.4 g/dL — ABNORMAL LOW (ref 3.5–5.0)
Alkaline Phosphatase: 112 U/L (ref 38–126)
Anion gap: 11 (ref 5–15)
BUN: 16 mg/dL (ref 6–20)
CO2: 27 mmol/L (ref 22–32)
Calcium: 7.1 mg/dL — ABNORMAL LOW (ref 8.9–10.3)
Chloride: 107 mmol/L (ref 98–111)
Creatinine, Ser: 0.77 mg/dL (ref 0.44–1.00)
GFR calc Af Amer: >60 mL/min (ref >60)
GFR calc non Af Amer: >60 mL/min (ref >60)
Glucose, Bld: 113 mg/dL — ABNORMAL HIGH (ref 70–99)
Potassium: 3.2 mmol/L — ABNORMAL LOW (ref 3.5–5.1)
Sodium: 145 mmol/L (ref 135–145)
Total Bilirubin: 0.7 mg/dL (ref 0.3–1.2)
Total Protein: 5.8 g/dL — ABNORMAL LOW (ref 6.5–8.1)

## 2020-02-04 LAB — GLUCOSE, CAPILLARY
Glucose-Capillary: 100 mg/dL — ABNORMAL HIGH (ref 70–99)
Glucose-Capillary: 117 mg/dL — ABNORMAL HIGH (ref 70–99)
Glucose-Capillary: 117 mg/dL — ABNORMAL HIGH (ref 70–99)
Glucose-Capillary: 118 mg/dL — ABNORMAL HIGH (ref 70–99)
Glucose-Capillary: 148 mg/dL — ABNORMAL HIGH (ref 70–99)
Glucose-Capillary: 84 mg/dL (ref 70–99)
Glucose-Capillary: 97 mg/dL (ref 70–99)
Glucose-Capillary: 99 mg/dL (ref 70–99)

## 2020-02-04 LAB — CBC WITH DIFFERENTIAL/PLATELET
Abs Immature Granulocytes: 0.46 10*3/uL — ABNORMAL HIGH (ref 0.00–0.07)
Basophils Absolute: 0.1 10*3/uL (ref 0.0–0.1)
Basophils Relative: 0 %
Eosinophils Absolute: 0 10*3/uL (ref 0.0–0.5)
Eosinophils Relative: 0 %
HCT: 46 % (ref 36.0–46.0)
Hemoglobin: 14.5 g/dL (ref 12.0–15.0)
Immature Granulocytes: 2 %
Lymphocytes Relative: 6 %
Lymphs Abs: 1.2 10*3/uL (ref 0.7–4.0)
MCH: 30.1 pg (ref 26.0–34.0)
MCHC: 31.5 g/dL (ref 30.0–36.0)
MCV: 95.6 fL (ref 80.0–100.0)
Monocytes Absolute: 0.8 10*3/uL (ref 0.1–1.0)
Monocytes Relative: 4 %
Neutro Abs: 17.8 10*3/uL — ABNORMAL HIGH (ref 1.7–7.7)
Neutrophils Relative %: 88 %
Platelets: 184 10*3/uL (ref 150–400)
RBC: 4.81 MIL/uL (ref 3.87–5.11)
RDW: 15.5 % (ref 11.5–15.5)
WBC: 20.3 10*3/uL — ABNORMAL HIGH (ref 4.0–10.5)
nRBC: 0.1 % (ref 0.0–0.2)

## 2020-02-04 LAB — BLOOD GAS, ARTERIAL
Acid-Base Excess: 0.7 mmol/L (ref 0.0–2.0)
Bicarbonate: 30.8 mmol/L — ABNORMAL HIGH (ref 20.0–28.0)
FIO2: 100
MECHVT: 500 mL
O2 Saturation: 99.8 %
PEEP: 12 cmH2O
Patient temperature: 37
RATE: 22 resp/min
pCO2 arterial: 77 mmHg (ref 32.0–48.0)
pH, Arterial: 7.21 — ABNORMAL LOW (ref 7.350–7.450)
pO2, Arterial: 254 mmHg — ABNORMAL HIGH (ref 83.0–108.0)

## 2020-02-04 LAB — MAGNESIUM: Magnesium: 2.1 mg/dL (ref 1.7–2.4)

## 2020-02-04 MED ORDER — PROSOURCE TF PO LIQD
45.0000 mL | Freq: Two times a day (BID) | ORAL | Status: DC
  Administered 2020-02-04 – 2020-02-08 (×9): 45 mL
  Filled 2020-02-04: qty 45

## 2020-02-04 MED ORDER — STERILE WATER FOR INJECTION IJ SOLN
INTRAMUSCULAR | Status: AC
  Administered 2020-02-04: 10 mL
  Filled 2020-02-04: qty 10

## 2020-02-04 MED ORDER — POTASSIUM CHLORIDE 20 MEQ PO PACK
40.0000 meq | PACK | Freq: Once | ORAL | Status: AC
  Administered 2020-02-04: 40 meq via NASOGASTRIC
  Filled 2020-02-04: qty 2

## 2020-02-04 MED ORDER — VECURONIUM BROMIDE 10 MG IV SOLR: 10.0000 mg | INTRAVENOUS | Status: DC | PRN

## 2020-02-04 MED ORDER — VECURONIUM BROMIDE 10 MG IV SOLR
10.0000 mg | Freq: Once | INTRAVENOUS | Status: AC
  Administered 2020-02-16: 10 mg via INTRAVENOUS

## 2020-02-04 MED ORDER — VITAL HIGH PROTEIN PO LIQD
1000.0000 mL | ORAL | Status: DC
  Administered 2020-02-04 – 2020-02-08 (×5): 1000 mL

## 2020-02-04 MED ORDER — VECURONIUM BROMIDE 10 MG IV SOLR
10.0000 mg | INTRAVENOUS | Status: DC | PRN
  Administered 2020-02-05 – 2020-02-12 (×7): 10 mg via INTRAVENOUS
  Filled 2020-02-04 (×11): qty 10

## 2020-02-04 NOTE — Progress Notes (Addendum)
Subjective: Proned overnight with significant improvement in oxygenation  Exam: Vitals:   02/04/20 0900 02/04/20 1000  BP: (!) 88/67 90/64  Pulse: 99   Resp: (!) 21 18  Temp: 100 F (37.8 C) 100 F (37.8 C)  SpO2: 95%    Gen: In bed, proned NAD Resp: double stacking vent frequently, but okay saturation  Abd: soft, nt  Neuro: MS: Does not open eyes follow commands, she does grimace to noxious stimulation CN: Pupils are reactive bilaterally, corneals difficult to test due to prone position but no reaction to eyelash brush Motor: No movement to noxious stimulation in her extremities Sensory: As above  Pertinent Labs: Ammonia initially 46, then 68 Creatinine 0.77 Free T4 low at 0.3  Impression: 54 year old female with acute encephalopathy in the setting of Covid and hypothyroidism. Her markedly elevated TSH could be suggestive that she had subclinical hypothyroidism which can result in significant encephalopathy after acute physiological stress (which she is currently experiencing). Though there was concern for some seizure-like activity, the description sounds more consistent with myoclonus, and EEG was negative.  Though my suspicion is low, with her continued fevers, and possible worsening on exam today, continue to LP is reasonable. Will additionally obtain imaging (dry head CT, CTA/CTV) given possibility of hypercoagulability with COVID-19 leading to venous thrombosis or strokes; last imaging of CNS was 9/29.   Recommendations: -LP for cells, glucose, protein, HSV -CT, CTA, CTV -continue treatment of underlying medical issues per CCM. -neurology to continue to follow  Lesleigh Noe MD-PhD [facility] [phone]   If 8pm- 8am, please page neurology on call as listed in [place].  I spent 30 minutes in critical care of this patient

## 2020-02-04 NOTE — Progress Notes (Signed)
Name: Marissa Cook MRN: [id number] DOB: February 23, 1966    ADMISSION DATE:  01/28/2020 CONSULTATION DATE: 01/30/2020  REFERRING MD : Dr. Sidney Ace   CHIEF COMPLAINT: AMS   BRIEF PATIENT DESCRIPTION:  54 yo female admitted with acute metabolic encephalopathy, acute metabolic acidosis, DKA, and COVID-19 pneumonia   SIGNIFICANT EVENTS/STUDIES:  09/28: CT Head revealed no acute intracranial findings. Hypoattenuating focus in the right basal ganglia compatible with remote lacunar infarct versus prominent perivascular space as detailed on comparison. 09/28: CTA Chest revealed no pulmonary embolus or acute aortic syndrome. Multifocal, peripheral predominant ground glass opacities, most consistent with multifocal pneumonia, including viral pneumonia. 09/29: Pt admitted to stepdown unit per hospitalist team, however remained in the ER pending bed availability 09/29:  PCCM team consulted to assist with management of COVID pneumonia and acute metabolic encephalopathy 2/95:  Patient is weaned to 35% on ventilator, weaned off sedation with mentation slowly waking up but not following verbal communication yet. Will hold off on EEG for now due to SBT and awakening trial.  Some concern for possible seizure activity overnight, we are monitoring closely and will perform EEG if any re-emergence of symptoms is noted.  02/01/20- patient is liberated from MV, she still is encephalopathic its unclear if she had central event.  I discussed case with neurology and there is plan for repeat EEG.   10/3- patient got re-intubated overnight due to encephalopathy which seems to be chronic recurrent.  I have discussed with husband most recent events and care plan 10/4- patient remains critically ill, neurologic evaluation on going  HISTORY OF PRESENT ILLNESS:   This is a 54 yo female with a PMH of Acute Pancreatitis, Hypothyroidism, HLD, Type II Diabetes Mellitus, HTN, H Pylori, Depression, Anemia, DDD, and Fatty Liver  Disease.  She presented to [facility] ER via EMS on 09/28 with altered mental status and c/o "not feeling well."  Upon arrival to the ER pt alert to self only.  She was prescribed azithromycin and prednisone due to acute respiratory failure by her PCP on 09/26 while awaiting for final COVID-19 results.  In the ER lab results ruled pt in for DKA and metabolic acidosis, therefore insulin gtt initiated.  COVID-19 positive and CXR concerning for pulmonary edema vs. multifocal pneumonia.   CTA Chest negative for PE or acute aortic syndrome, but concerning for multifocal pneumonia.  CT Head negative for acute intracranial findings.  She was subsequently admitted to the stepdown unit per hospitalist team for additional workup and treatment, but remained in the ER pending bed availability.  PCCM team consulted on 09/29 to assist with management of COVID pneumonia and acute metabolic encephalopathy.    PAST MEDICAL HISTORY :   has a past medical history of Anxiety, DDD (degenerative disc disease), lumbar, Depression, Diabetes mellitus without complication ([place]), Hyperlipidemia, Hypothyroidism, and Pancreatitis.  has a past surgical history that includes Abdominal hysterectomy; Cystectomy; Colonoscopy (N/A, 01/15/2016); Esophagogastroduodenoscopy (egd) with propofol (N/A, 01/15/2016); and Anterior lat lumbar fusion (N/A, 02/21/2019). Prior to Admission medications   Medication Sig Start Date End Date Taking? Authorizing Provider  aspirin EC 81 MG tablet Take 81 mg by mouth daily. Swallow whole.   Yes [provider]  atorvastatin (LIPITOR) 80 MG tablet Take 1 tablet by mouth daily. 01/17/20 01/16/21 Yes [provider]  DULoxetine (CYMBALTA) 30 MG capsule Take 30 mg by mouth daily. 01/09/2020  Yes [provider]  empagliflozin (JARDIANCE) 25 MG TABS tablet Take 1 tablet by mouth daily. 01/17/20  Yes [provider]  gabapentin (NEURONTIN) 600 MG tablet Take 600 mg by mouth 3 (three) times  daily. 01/11/20  Yes [provider]  insulin aspart (NOVOLOG) 100 UNIT/ML injection Inject 8 Units into the skin 3 (three) times daily before meals. Plus sliding scale, MAX 80 units daily.   Yes [provider]  insulin degludec (TRESIBA FLEXTOUCH) 100 UNIT/ML SOPN FlexTouch Pen Inject 45 Units into the skin at bedtime.    Yes [provider]  levothyroxine (SYNTHROID) 300 MCG tablet Take 300 mcg by mouth daily before breakfast.    Yes [provider]  linaclotide (LINZESS) 145 MCG CAPS capsule Take 1 capsule by mouth daily. 01/17/20  Yes [provider]  metFORMIN (GLUCOPHAGE-XR) 500 MG 24 hr tablet Take 2 tablets by mouth in the morning and at bedtime. 01/17/20 2020/03/07 Yes [provider]  metoprolol tartrate (LOPRESSOR) 50 MG tablet Take 1 tablet by mouth in the morning and at bedtime. 01/17/20  Yes [provider]  QUEtiapine (SEROQUEL) 50 MG tablet Take 100 mg by mouth at bedtime. 01/25/2020  Yes [provider]  cyclobenzaprine (FLEXERIL) 10 MG tablet Take 1 tablet (10 mg total) by mouth 3 (three) times daily as needed for muscle spasms. Patient not taking: Reported on 01/30/2020 02/22/19   Newman Pies, MD  docusate sodium (COLACE) 100 MG capsule Take 1 capsule (100 mg total) by mouth 2 (two) times daily. Patient not taking: Reported on 01/07/2020 02/22/19   Newman Pies, MD  oxyCODONE (OXY IR/ROXICODONE) 5 MG immediate release tablet Take 1 tablet (5 mg total) by mouth every 4 (four) hours as needed for moderate pain ((score 4 to 6)). Patient not taking: Reported on 01/28/2020 02/22/19   Newman Pies, MD   Allergies  Allergen Reactions   Sulfasalazine     UNSPECIFIED REACTION     FAMILY HISTORY:  family history is not on file. SOCIAL HISTORY:  reports that she has never smoked. She has never used smokeless tobacco. She reports that she does not drink alcohol and does not use drugs.  REVIEW OF SYSTEMS:    Unable to assess pt confused   SUBJECTIVE:  Unable to assess pt confused   VITAL SIGNS: Temp:  [99 F (37.2 C)-100.8 F (38.2 C)] 100 F (37.8 C) (10/04 0900) Pulse Rate:  [91-137] 99 (10/04 0900) Resp:  [13-27] 21 (10/04 0900) BP: (79-155)/(53-78) 88/67 (10/04 0900) SpO2:  [68 %-100 %] 95 % (10/04 0900) FiO2 (%):  [50 %-100 %] 50 % (10/04 0750) Weight:  [79.8 kg] 79.8 kg (10/04 0207)  PHYSICAL EXAMINATION: General: acutely ill appearing female, NAD  Neuro: GCS3t HEENT: supple, no JVD  Cardiovascular: sinus rhythm, rrr, no R/G Lungs: diminished throughout, even, non labored  Abdomen: hypoactive BS X4, obese, soft  Musculoskeletal: normal bulk and tone, no edema  Skin: intact no rashes or lesions present   Recent Labs  Lab 02/02/20 0544 02/03/20 0807 02/04/20 0343  NA 141 141 145  K 3.0* 3.6 3.2*  CL 106 107 107  CO2 27 21* 27  BUN 13 15 16   CREATININE 0.69 0.63 0.77  GLUCOSE 89 101* 113*   Recent Labs  Lab 02/02/20 0544 02/03/20 0807 02/04/20 0343  HGB 13.9 13.4 14.5  HCT 40.5 40.8 46.0  WBC 11.7* 15.9* 20.3*  PLT 164 144* 184   DG Chest Port 1 View  Result Date: 02/04/2020 CLINICAL DATA:  Acute respiratory failure, COVID positive. EXAM: PORTABLE CHEST 1 VIEW COMPARISON:  02/02/2020 and CT chest 01/31/2020.  FINDINGS: Endotracheal tube terminates 6.1 cm above the carina. Nasogastric tube terminates in the stomach with the side port beyond the GE junction. Heart size stable. Slight overall improvement in patchy bilateral airspace opacification. No pleural fluid. No pneumothorax. IMPRESSION: Improving COVID 19 pneumonia. Electronically Signed   By: Lorin Picket M.D.   On: 02/04/2020 07:45    ASSESSMENT / PLAN:  Acute hypoxic respiratory failure secondary to COVID-19 pneumonia and acute metabolic acidosis  Prn supplemental O2 to maintain O2 sats 88% or higher Continue remdesivir, vitamins, and iv steroids Trend inflammatory markers  Encourage self-proning  as tolerated  Prn antitussives  Pulmonary hygiene   Diabetic Ketoacidosis  Severe metabolic acidosis in the setting of DKA  Continuous telemetry monitoring  Per ICU Intensivist recommendations transitioning pt off insulin gtt due to closure of anion gap-20 units lantus bid and SSI   Will start sodium bicarb gtt @125  ml/hr Repeat VBG and BMP pending  -9/30- increased levemir [place] to 25 bid  Altered mental status with encephalopathy  -neurology on case - appreciate input  - acyclovir started  - possible LP   - thyroid workup  - patient on seroquel from home  - Neurology following, EEG without evidence of seizure.   Hypothyroidism  TSH - patient on synthroid 300 per neurology   Acute encephalopathy secondary to metabolic derangements and [id number]  Correct metabolic derangements  Frequent reorientation    Best Practice VTE px: subq lovenox SUP px: pepcid  Code status: Full code   Critical care provider statement:    Critical care time (minutes):  33   Critical care time was exclusive of:  Separately billable procedures and  treating other patients   Critical care was necessary to treat or prevent imminent or  life-threatening deterioration of the following conditions:  acute hypoxemic respiratory failure , covid19 pneumonia, DKA.   Critical care was time spent personally by me on the following  activities:  Development of treatment plan with patient or surrogate,  discussions with consultants, evaluation of patient's response to  treatment, examination of patient, obtaining history from patient or  surrogate, ordering and performing treatments and interventions, ordering  and review of laboratory studies and re-evaluation of patient's condition   I assumed direction of critical care for this patient from another  provider in my specialty: no    Jorje Guild, M.D., M.P.H.  Pulmonary & Critical Care Medicine

## 2020-02-04 NOTE — Procedures (Signed)
Lumbar Puncture Procedure Note  LORENZA SHAKIR  [id number]  1965-07-14  Date:02/04/20  Time:11:20 AM   Provider Performing:Rook Maue   Procedure: Lumbar Puncture (47395)  Indication(s) Rule out meningitis  Consent Risks of the procedure as well as the alternatives and risks of each were explained to the patient and/or caregiver.  Consent for the procedure was obtained and is signed in the bedside chart  Anesthesia Topical only with 1% lidocaine    Time Out Verified patient identification, verified procedure, site/side was marked, verified correct patient position, special equipment/implants available, medications/allergies/relevant history reviewed, required imaging and test results available.   Sterile Technique Maximal sterile technique including sterile barrier drape, hand hygiene, sterile gown, sterile gloves, mask, hair covering.    Procedure Description Using palpation, approximate location of L3-L4 space identified.   Lidocaine used to anesthetize skin and subcutaneous tissue overlying this area.  A 20g spinal needle was then used, however the subarachnoid space could not be accessed.   Complications/Tolerance None; patient tolerated the procedure well. Subarachnoid space was not accessed   EBL Minimal   Specimen(s) None

## 2020-02-04 NOTE — Progress Notes (Signed)
Nutrition Follow-up  DOCUMENTATION CODES:   Obesity unspecified  INTERVENTION:  Initiate Vital High Protein at 20 mL/hr and advance by 15 mL/hr every 8 hours to goal rate of 50 mL/hr (1200 mL goal daily volume) per tube. Also provide PROSource TF 45 mL BID per tube. Goal regimen provides 1280 kcal, 127 grams of protein, 1008 mL H2O daily. With current propofol rate provides 1671 kcal daily.  Provide MVI daily per tube.  Monitor magnesium, potassium, and phosphorus daily for at least 3 days, MD to replete as needed, as pt is at risk for refeeding syndrome.  NUTRITION DIAGNOSIS:   Inadequate oral intake related to inability to eat as evidenced by NPO status.  Ongoing.  GOAL:   Patient will meet greater than or equal to 90% of their needs  Progressing with initiation of tube feeds today.  MONITOR:   Vent status, Labs, Weight trends, TF tolerance, Skin, I & O's  REASON FOR ASSESSMENT:   Ventilator    ASSESSMENT:   54 year old female with PMHx of anxiety, depression, DM, hypothyroidism, HLD admitted with COVID-19 PNA, DKA, acute metabolic encephalopathy.  9/29 transferred to ICU from ED and intubated 10/1 extubated 10/2 reintubated  Patient is currently intubated on ventilator support MV: 11.7 L/min Temp (24hrs), Avg:100.1 F (37.8 C), Min:99.3 F (37.4 C), Max:100.8 F (38.2 C)  Propofol: 14.8 ml/hr (391 kcal daily)  Medications reviewed and include: vitamin C 500 mg daily per tube, vitamin D3 1000 units daily per tube, Colace 100 mg BID per tube, Novolog 0-20 units Q4hrs, Levemir 25 units BID, levothyroxine, Miralax, thiamine 100 mg daily IV, zinc sulfate 220 mg daily per tube, acyclovir, famotidine, fentanyl gtt, LR at 50 mL/hr, phenylephrine gtt at 3 mcg/min, propofol gtt.  Labs reviewed: CBG 99-118, Potassium 3.2. Phosphorus and Magnesium WNL today.  I/O: 2340 mL UOP yesterday (1.2 mL/kg/hr)  Weight trend: 79.8 kg on 10/4; +0.4 kg from 9/29  Enteral Access:  OGT placed 10/2; terminates in proximal stomach per abdominal x-ray 10/2  Discussed with RN and on rounds. Okay to start tube feeds today. Plan is for lumbar puncture today.  Diet Order:   Diet Order            Diet NPO time specified  Diet effective now                EDUCATION NEEDS:   No education needs have been identified at this time  Skin:  Skin Assessment: Skin Integrity Issues: Skin Integrity Issues:: Stage I Stage I: coccyx  Last BM:  Unknown/PTA  Height:   Ht Readings from Last 1 Encounters:  02/02/20 5' 2.99" (1.6 m)   Weight:   Wt Readings from Last 1 Encounters:  02/04/20 79.8 kg   Ideal Body Weight:  52.3 kg  BMI:  Body mass index is 31.17 kg/m.  Estimated Nutritional Needs:   Kcal:  1661  Protein:  120-130 grams  Fluid:  >/= 2.3 L/day  Jacklynn Barnacle, MS, RD, LDN Pager number available on Amion

## 2020-02-04 NOTE — Plan of Care (Signed)
Discussed with the husband plan of care, pain management and proning with the patient with some teach back displayed

## 2020-02-04 NOTE — Plan of Care (Signed)
Patient unable to have any evidence of learning at this time.  Patient was given paralytic to have her sync with ventilator and prior to proning.

## 2020-02-05 ENCOUNTER — Inpatient Hospital Stay: Payer: BC Managed Care – PPO

## 2020-02-05 ENCOUNTER — Encounter: Payer: Self-pay | Admitting: Pulmonary Disease

## 2020-02-05 ENCOUNTER — Inpatient Hospital Stay: Payer: Self-pay

## 2020-02-05 DIAGNOSIS — J9600 Acute respiratory failure, unspecified whether with hypoxia or hypercapnia: Secondary | ICD-10-CM

## 2020-02-05 DIAGNOSIS — U071 COVID-19: Secondary | ICD-10-CM | POA: Diagnosis not present

## 2020-02-05 LAB — CBC WITH DIFFERENTIAL/PLATELET
Abs Immature Granulocytes: 0.29 10*3/uL — ABNORMAL HIGH (ref 0.00–0.07)
Basophils Absolute: 0 10*3/uL (ref 0.0–0.1)
Basophils Relative: 0 %
Eosinophils Absolute: 0 10*3/uL (ref 0.0–0.5)
Eosinophils Relative: 0 %
HCT: 38.6 % (ref 36.0–46.0)
Hemoglobin: 12.2 g/dL (ref 12.0–15.0)
Immature Granulocytes: 2 %
Lymphocytes Relative: 3 %
Lymphs Abs: 0.5 10*3/uL — ABNORMAL LOW (ref 0.7–4.0)
MCH: 30.3 pg (ref 26.0–34.0)
MCHC: 31.6 g/dL (ref 30.0–36.0)
MCV: 96 fL (ref 80.0–100.0)
Monocytes Absolute: 0.7 10*3/uL (ref 0.1–1.0)
Monocytes Relative: 4 %
Neutro Abs: 14.5 10*3/uL — ABNORMAL HIGH (ref 1.7–7.7)
Neutrophils Relative %: 91 %
Platelets: 146 10*3/uL — ABNORMAL LOW (ref 150–400)
RBC: 4.02 MIL/uL (ref 3.87–5.11)
RDW: 15.4 % (ref 11.5–15.5)
WBC: 16 10*3/uL — ABNORMAL HIGH (ref 4.0–10.5)
nRBC: 0 % (ref 0.0–0.2)

## 2020-02-05 LAB — BLOOD GAS, ARTERIAL
Acid-base deficit: 1.4 mmol/L (ref 0.0–2.0)
Bicarbonate: 28.7 mmol/L — ABNORMAL HIGH (ref 20.0–28.0)
FIO2: 0.5
O2 Saturation: 95 %
PEEP: 10 cmH2O
Patient temperature: 37
RATE: 26 resp/min
pCO2 arterial: 77 mmHg (ref 32.0–48.0)
pH, Arterial: 7.18 — CL (ref 7.350–7.450)
pO2, Arterial: 93 mmHg (ref 83.0–108.0)

## 2020-02-05 LAB — CSF CELL COUNT WITH DIFFERENTIAL
Eosinophils, CSF: 0 %
Lymphs, CSF: 83 %
Monocyte-Macrophage-Spinal Fluid: 15 %
RBC Count, CSF: 23 /mm3 — ABNORMAL HIGH (ref 0–3)
Segmented Neutrophils-CSF: 2 %
Tube #: 3
WBC, CSF: 5 /mm3 (ref 0–5)

## 2020-02-05 LAB — PROTIME-INR
INR: 1.2 (ref 0.8–1.2)
Prothrombin Time: 14.7 seconds (ref 11.4–15.2)

## 2020-02-05 LAB — PROTEIN, CSF: Total  Protein, CSF: 102 mg/dL — ABNORMAL HIGH (ref 15–45)

## 2020-02-05 LAB — BASIC METABOLIC PANEL
Anion gap: 8 (ref 5–15)
BUN: 17 mg/dL (ref 6–20)
CO2: 29 mmol/L (ref 22–32)
Calcium: 7.2 mg/dL — ABNORMAL LOW (ref 8.9–10.3)
Chloride: 107 mmol/L (ref 98–111)
Creatinine, Ser: 0.81 mg/dL (ref 0.44–1.00)
GFR calc Af Amer: >60 mL/min (ref >60)
GFR calc non Af Amer: >60 mL/min (ref >60)
Glucose, Bld: 172 mg/dL — ABNORMAL HIGH (ref 70–99)
Potassium: 3.7 mmol/L (ref 3.5–5.1)
Sodium: 144 mmol/L (ref 135–145)

## 2020-02-05 LAB — MAGNESIUM: Magnesium: 2 mg/dL (ref 1.7–2.4)

## 2020-02-05 LAB — GLUCOSE, CAPILLARY
Glucose-Capillary: 136 mg/dL — ABNORMAL HIGH (ref 70–99)
Glucose-Capillary: 154 mg/dL — ABNORMAL HIGH (ref 70–99)
Glucose-Capillary: 173 mg/dL — ABNORMAL HIGH (ref 70–99)
Glucose-Capillary: 184 mg/dL — ABNORMAL HIGH (ref 70–99)
Glucose-Capillary: 185 mg/dL — ABNORMAL HIGH (ref 70–99)
Glucose-Capillary: 216 mg/dL — ABNORMAL HIGH (ref 70–99)

## 2020-02-05 LAB — GLUCOSE, CSF: Glucose, CSF: 128 mg/dL — ABNORMAL HIGH (ref 40–70)

## 2020-02-05 LAB — APTT: aPTT: 31 seconds (ref 24–36)

## 2020-02-05 LAB — PHOSPHORUS: Phosphorus: 3 mg/dL (ref 2.5–4.6)

## 2020-02-05 MED ORDER — SODIUM CHLORIDE 0.9% FLUSH
10.0000 mL | Freq: Two times a day (BID) | INTRAVENOUS | Status: DC
  Administered 2020-02-05: 20 mL
  Administered 2020-02-05 – 2020-02-15 (×15): 10 mL
  Administered 2020-02-15: 40 mL
  Administered 2020-02-16: 10 mL

## 2020-02-05 MED ORDER — STERILE WATER FOR INJECTION IJ SOLN
INTRAMUSCULAR | Status: AC
  Filled 2020-02-05: qty 10

## 2020-02-05 MED ORDER — IOHEXOL 350 MG/ML SOLN
75.0000 mL | Freq: Once | INTRAVENOUS | Status: AC | PRN
  Administered 2020-02-05: 75 mL via INTRAVENOUS

## 2020-02-05 MED ORDER — METOPROLOL TARTRATE 5 MG/5ML IV SOLN
2.5000 mg | INTRAVENOUS | Status: AC
  Administered 2020-02-05: 2.5 mg via INTRAVENOUS
  Filled 2020-02-05: qty 5

## 2020-02-05 MED ORDER — SODIUM CHLORIDE 0.9% FLUSH: 10.0000 mL | INTRAVENOUS | Status: DC | PRN

## 2020-02-05 NOTE — Plan of Care (Signed)
Will try to contact husband and update him on plan of care for the evening, medications and answer any question with some teach back attempted

## 2020-02-05 NOTE — Progress Notes (Signed)
CXR post central line placement shows Left IJ CVC crossing over midline, but with the tip in what appears to be the right Brachiocephalic vein.  Line with good blood return, flushing easily without difficultly.  Discussed with Dr. Emmit Alexanders of [facility] regarding line positioning and use.  Dr. Emmit Alexanders recommends that we avoid running Vasopressors or TPN through the central line, but ok to use for now for IV fluids.  Will leave line in place for now for IV fluids and lab draws, and will place consult for PICC line placement this morning. Once PICC in place, will remove Left IJ CVC.  Discussed with nursing.     Darel Hong, AGACNP-BC [place] Pager: [phone]

## 2020-02-05 NOTE — Procedures (Signed)
Arterial Catheter Insertion Procedure Note  Marissa Cook  [id number]  04/21/1966  Date:02/05/20  Time:1:49 AM    Provider Performing: Bradly Bienenstock    Procedure: Insertion of Arterial Line [phone]) with US guidance (40814)   Indication(s) Blood pressure monitoring and/or need for frequent ABGs  Consent Unable to obtain consent due to emergent nature of procedure.  Anesthesia None   Time Out Verified patient identification, verified procedure, site/side was marked, verified correct patient position, special equipment/implants available, medications/allergies/relevant history reviewed, required imaging and test results available.   Sterile Technique Maximal sterile technique including full sterile barrier drape, hand hygiene, sterile gown, sterile gloves, mask, hair covering, sterile ultrasound probe cover (if used).   Procedure Description Area of catheter insertion was cleaned with chlorhexidine and draped in sterile fashion. With real-time ultrasound guidance an arterial catheter was placed into the left femoral artery.  Appropriate arterial tracings confirmed on monitor.     Complications/Tolerance None; patient tolerated the procedure well.   EBL Minimal   Specimen(s) None   BIOPATCH applied to the insertion site.      Darel Hong, AGACNP-BC [facility] Pager: [phone]

## 2020-02-05 NOTE — Progress Notes (Signed)
Pt transported to CT and to radiology on vent.

## 2020-02-05 NOTE — Progress Notes (Addendum)
Subjective: Supinated but the required reproning and multiple doses of paralytics, now supinated again  Exam: Vitals:   02/05/20 1930 02/05/20 1938  BP:    Pulse:    Resp: (!) 30   Temp: (!) 97.3 F (36.3 C)   SpO2:  98%   Gen: In bed, proned NAD Resp: on ventilator  Abd: soft, nt  Neuro: MS: Does not open eyes follow commands, nor does she grimace CN: Pupils are reactive bilaterally 2 -> 1.5 mm, no corneals Motor: No movement to noxious stimulation in her extremities Sensory: As above  Pertinent Labs: pH 7.18, pCO2 77 on 10/5 at 4 AM Ammonia initially 46, then 68 Creatinine 0.77 -> 0.81 Free T4 low at 0.3  WBC 20 -> 16  CSF: Glucose 128 (serum 184), WBC 5, RBC 23, Protein 102 VZV and HSV in process   CT, CTA and CTV 10/5 personally reviewed, no acute processes   Impression: 54 year old female with acute encephalopathy in the setting of Covid and hypothyroidism. Her markedly elevated TSH could be suggestive that she had subclinical hypothyroidism which can result in significant encephalopathy after acute physiological stress (which she is currently experiencing). Though there was concern for some seizure-like activity, the description sounds more consistent with myoclonus, and EEG was negative. She has had worsening mental status in the setting of requiring re-intubation and continuing to have a significant respiratory acidosis. This alone may explain her mental status, but CTA CTV were performed to exclude stroke or venous thrombosis in the setting of her acute infection. Fortunately these studies are negative. CSF studies are largely reassuring; elevated protein is a non-specific finding and can be seen with lumbar stenosis, diabetes, etc. Still, given improved fever curve and leucocytosis correlating with acyclovir (and tap several days after acyclovir was started), would continue acyclovir until HSV/VZV result (typically 1-2 days)   Recommendations: -continue acyclovir until  HSV/VZV results negative -continue treatment of underlying medical issues per CCM. -I will next plan to see patient on 10/7 unless there is an acute change on 10/6   Lesleigh Noe MD-PhD [facility] [phone]   If 8pm- 8am, please page neurology on call as listed in [place].  I spent 30 minutes in critical care time for this patient as detailed above.   Addended to link to charge capture

## 2020-02-05 NOTE — Progress Notes (Signed)
Updated husband and answered all questions at this time

## 2020-02-05 NOTE — Progress Notes (Signed)
Peripherally Inserted Central Catheter Placement  The IV Nurse has discussed with the patient and/or persons authorized to consent for the patient, the purpose of this procedure and the potential benefits and risks involved with this procedure.  The benefits include less needle sticks, lab draws from the catheter, and the patient may be discharged home with the catheter. Risks include, but not limited to, infection, bleeding, blood clot (thrombus formation), and puncture of an artery; nerve damage and irregular heartbeat and possibility to perform a PICC exchange if needed/ordered by physician.  Alternatives to this procedure were also discussed.  Bard Power PICC patient education guide, fact sheet on infection prevention and patient information card has been provided to patient /or left at bedside.    PICC Placement Documentation  PICC Triple Lumen 01/74/94 PICC Right Basilic 35 cm 0 cm (Active)  Indication for Insertion or Continuance of Line Prolonged intravenous therapies 02/05/20 1059  Exposed Catheter (cm) 0 cm 02/05/20 1059  Site Assessment Clean;Dry;Intact 02/05/20 1059  Lumen #1 Status Blood return noted;Flushed;Saline locked 02/05/20 1059  Lumen #2 Status Blood return noted;Flushed;Saline locked 02/05/20 1059  Lumen #3 Status Blood return noted;Flushed;Saline locked 02/05/20 1059  Dressing Type Transparent;Securing device 02/05/20 1059  Dressing Status Clean;Dry;Intact 02/05/20 1059  Antimicrobial disc in place? Yes 02/05/20 1059  Safety Lock Not Applicable 49/67/59 1638  Dressing Change Due 02/12/20 02/05/20 1059       Frances Maywood 02/05/2020, 11:02 AM

## 2020-02-05 NOTE — Procedures (Signed)
Central Venous Catheter Insertion Procedure Note  Marissa Cook  [id number]  08-29-1965  Date:02/05/20  Time:1:48 AM   Provider Performing:Telsa Dillavou D Dewaine Conger   Procedure: Insertion of Non-tunneled Central Venous Catheter(36556) with US guidance (50158)   Indication(s) Medication administration and Difficult access  Consent Unable to obtain consent due to emergent nature of procedure.  Anesthesia Topical only with 1% lidocaine   Timeout Verified patient identification, verified procedure, site/side was marked, verified correct patient position, special equipment/implants available, medications/allergies/relevant history reviewed, required imaging and test results available.  Sterile Technique Maximal sterile technique including full sterile barrier drape, hand hygiene, sterile gown, sterile gloves, mask, hair covering, sterile ultrasound probe cover (if used).  Procedure Description Area of catheter insertion was cleaned with chlorhexidine and draped in sterile fashion.  With real-time ultrasound guidance a central venous catheter was placed into the left internal jugular vein. Nonpulsatile blood flow and easy flushing noted in all ports.  The catheter was sutured in place and sterile dressing applied.  Complications/Tolerance None; patient tolerated the procedure well. Chest X-ray is ordered to verify placement for internal jugular or subclavian cannulation.   Chest x-ray is not ordered for femoral cannulation.  EBL Minimal  Specimen(s) None   Line secured at the 20 cm mark.  BIOPATCH applied to the insertion site.     Darel Hong, AGACNP-BC [facility] [facility] Pager: [phone]

## 2020-02-05 NOTE — Progress Notes (Signed)
Name: INGRID SHIFRIN MRN: [id number] DOB: 14-Sep-1965    ADMISSION DATE:  01/14/2020 CONSULTATION DATE: 01/30/2020  REFERRING MD : Dr. Sidney Ace   CHIEF COMPLAINT: AMS   BRIEF PATIENT DESCRIPTION:  54 yo female admitted with acute metabolic encephalopathy, acute metabolic acidosis, DKA, and COVID-19 pneumonia   SIGNIFICANT EVENTS/STUDIES:  09/28: CT Head revealed no acute intracranial findings. Hypoattenuating focus in the right basal ganglia compatible with remote lacunar infarct versus prominent perivascular space as detailed on comparison. 09/28: CTA Chest revealed no pulmonary embolus or acute aortic syndrome. Multifocal, peripheral predominant ground glass opacities, most consistent with multifocal pneumonia, including viral pneumonia. 09/29: Pt admitted to stepdown unit per hospitalist team, however remained in the ER pending bed availability 09/29:  PCCM team consulted to assist with management of COVID pneumonia and acute metabolic encephalopathy 1/60:  Patient is weaned to 35% on ventilator, weaned off sedation with mentation slowly waking up but not following verbal communication yet. Will hold off on EEG for now due to SBT and awakening trial.  Some concern for possible seizure activity overnight, we are monitoring closely and will perform EEG if any re-emergence of symptoms is noted.  02/01/20- patient is liberated from MV, she still is encephalopathic its unclear if she had central event.  I discussed case with neurology and there is plan for repeat EEG.   10/3- patient got re-intubated overnight due to encephalopathy which seems to be chronic recurrent.  I have discussed with husband most recent events and care plan 10/4- patient remains critically ill, neurologic evaluation on going 10/5- Worsening hypoxia when patient turned supine from prone, requiring 100% FiO2 and deep sedation/paralysis  HISTORY OF PRESENT ILLNESS:   This is a 53 yo female with a PMH of Acute  Pancreatitis, Hypothyroidism, HLD, Type II Diabetes Mellitus, HTN, H Pylori, Depression, Anemia, DDD, and Fatty Liver Disease.  She presented to [facility] ER via EMS on 09/28 with altered mental status and c/o "not feeling well."  Upon arrival to the ER pt alert to self only.  She was prescribed azithromycin and prednisone due to acute respiratory failure by her PCP on 09/26 while awaiting for final COVID-19 results.  In the ER lab results ruled pt in for DKA and metabolic acidosis, therefore insulin gtt initiated.  COVID-19 positive and CXR concerning for pulmonary edema vs. multifocal pneumonia.   CTA Chest negative for PE or acute aortic syndrome, but concerning for multifocal pneumonia.  CT Head negative for acute intracranial findings.  She was subsequently admitted to the stepdown unit per hospitalist team for additional workup and treatment, but remained in the ER pending bed availability.  PCCM team consulted on 09/29 to assist with management of COVID pneumonia and acute metabolic encephalopathy.    PAST MEDICAL HISTORY :   has a past medical history of Anxiety, DDD (degenerative disc disease), lumbar, Depression, Diabetes mellitus without complication ([place]), Hyperlipidemia, Hypothyroidism, and Pancreatitis.  has a past surgical history that includes Abdominal hysterectomy; Cystectomy; Colonoscopy (N/A, 01/15/2016); Esophagogastroduodenoscopy (egd) with propofol (N/A, 01/15/2016); and Anterior lat lumbar fusion (N/A, 02/21/2019). Prior to Admission medications   Medication Sig Start Date End Date Taking? Authorizing Provider  aspirin EC 81 MG tablet Take 81 mg by mouth daily. Swallow whole.   Yes [provider]  atorvastatin (LIPITOR) 80 MG tablet Take 1 tablet by mouth daily. 01/17/20 01/16/21 Yes [provider]  DULoxetine (CYMBALTA) 30 MG capsule Take 30 mg by mouth daily. 01/23/2020  Yes [provider]  empagliflozin (  JARDIANCE) 25 MG TABS tablet Take 1 tablet by mouth daily.  01/17/20  Yes [provider]  gabapentin (NEURONTIN) 600 MG tablet Take 600 mg by mouth 3 (three) times daily. 01/11/20  Yes [provider]  insulin aspart (NOVOLOG) 100 UNIT/ML injection Inject 8 Units into the skin 3 (three) times daily before meals. Plus sliding scale, MAX 80 units daily.   Yes [provider]  insulin degludec (TRESIBA FLEXTOUCH) 100 UNIT/ML SOPN FlexTouch Pen Inject 45 Units into the skin at bedtime.    Yes [provider]  levothyroxine (SYNTHROID) 300 MCG tablet Take 300 mcg by mouth daily before breakfast.    Yes [provider]  linaclotide (LINZESS) 145 MCG CAPS capsule Take 1 capsule by mouth daily. 01/17/20  Yes [provider]  metFORMIN (GLUCOPHAGE-XR) 500 MG 24 hr tablet Take 2 tablets by mouth in the morning and at bedtime. 01/17/20 2020/02/20 Yes [provider]  metoprolol tartrate (LOPRESSOR) 50 MG tablet Take 1 tablet by mouth in the morning and at bedtime. 01/17/20  Yes [provider]  QUEtiapine (SEROQUEL) 50 MG tablet Take 100 mg by mouth at bedtime. 01/05/2020  Yes [provider]  cyclobenzaprine (FLEXERIL) 10 MG tablet Take 1 tablet (10 mg total) by mouth 3 (three) times daily as needed for muscle spasms. Patient not taking: Reported on 01/13/2020 02/22/19   Newman Pies, MD  docusate sodium (COLACE) 100 MG capsule Take 1 capsule (100 mg total) by mouth 2 (two) times daily. Patient not taking: Reported on 01/22/2020 02/22/19   Newman Pies, MD  oxyCODONE (OXY IR/ROXICODONE) 5 MG immediate release tablet Take 1 tablet (5 mg total) by mouth every 4 (four) hours as needed for moderate pain ((score 4 to 6)). Patient not taking: Reported on 01/30/2020 02/22/19   Newman Pies, MD   Allergies  Allergen Reactions  . Sulfasalazine     UNSPECIFIED REACTION     FAMILY HISTORY:  family history is not on file. SOCIAL HISTORY:  reports that she has never smoked. She has never used  smokeless tobacco. She reports that she does not drink alcohol and does not use drugs.  REVIEW OF SYSTEMS:   Unable to assess pt confused   SUBJECTIVE:  Unable to assess pt confused   VITAL SIGNS: Temp:  [99 F (37.2 C)-100.4 F (38 C)] 99.7 F (37.6 C) (10/05 0600) Pulse Rate:  [95-131] 130 (10/05 0600) Resp:  [12-30] 30 (10/05 0600) BP: (89-122)/(47-71) 95/59 (10/05 0530) SpO2:  [46 %-99 %] 93 % (10/05 0600) Arterial Line BP: (87-112)/(49-60) 87/50 (10/05 0600) FiO2 (%):  [50 %-100 %] 100 % (10/05 0800) Weight:  [86.6 kg] 86.6 kg (10/05 0338)  PHYSICAL EXAMINATION: General: acutely ill appearing female, NAD  Neuro: GCS3t HEENT: supple, no JVD  Cardiovascular: sinus rhythm, rrr, no R/G Lungs: diminished throughout, even, non labored  Abdomen: hypoactive BS X4, obese, soft  Musculoskeletal: normal bulk and tone, no edema  Skin: intact no rashes or lesions present   Recent Labs  Lab 02/03/20 0807 02/04/20 0343 02/05/20 0342  NA 141 145 144  K 3.6 3.2* 3.7  CL 107 107 107  CO2 21* 27 29  BUN 15 16 17   CREATININE 0.63 0.77 0.81  GLUCOSE 101* 113* 172*   Recent Labs  Lab 02/03/20 0807 02/04/20 0343 02/05/20 0342  HGB 13.4 14.5 12.2  HCT 40.8 46.0 38.6  WBC 15.9* 20.3* 16.0*  PLT 144* 184 146*   DG Chest Port 1 View  Result  Date: 02/05/2020 CLINICAL DATA:  Central line placement EXAM: PORTABLE CHEST 1 VIEW COMPARISON:  02/04/2020 FINDINGS: There has been interval placement of a left-sided central venous catheter. The tip courses across the midline and projects over the right lung apex, presumably within the right brachiocephalic vein. The endotracheal tube terminates above the carina. The enteric tube extends below the left hemidiaphragm. Hazy bilateral airspace opacities are noted, worsening since prior study. There is no definite pneumothorax. The heart size is stable. There is no acute osseous abnormality. IMPRESSION: 1. Interval placement of a left-sided central  venous catheter as above with the tip coursing across the midline and projects over the right lung apex, presumably within the right brachiocephalic vein. 2. No definite pneumothorax. 3. Persistent hazy bilateral airspace opacities which have worsened since the prior study, especially at the right lung base. Electronically Signed   By: Constance Holster M.D.   On: 02/05/2020 02:23   DG Chest Port 1 View  Result Date: 02/04/2020 CLINICAL DATA:  Acute respiratory failure, COVID positive. EXAM: PORTABLE CHEST 1 VIEW COMPARISON:  02/02/2020 and CT chest 01/06/2020. FINDINGS: Endotracheal tube terminates 6.1 cm above the carina. Nasogastric tube terminates in the stomach with the side port beyond the GE junction. Heart size stable. Slight overall improvement in patchy bilateral airspace opacification. No pleural fluid. No pneumothorax. IMPRESSION: Improving COVID 19 pneumonia. Electronically Signed   By: Lorin Picket M.D.   On: 02/04/2020 07:45   [place] EKG SITE RITE  Result Date: 02/05/2020 If Site Rite image not attached, placement could not be confirmed due to current cardiac rhythm.   ASSESSMENT / PLAN:  Acute hypoxic respiratory failure secondary to COVID-19 pneumonia and acute metabolic acidosis  Prn supplemental O2 to maintain O2 sats 88% or higher Continue remdesivir, vitamins, and iv steroids Trend inflammatory markers  Encourage self-proning as tolerated  Prn antitussives  Pulmonary hygiene   Diabetic Ketoacidosis  Severe metabolic acidosis in the setting of DKA  Continuous telemetry monitoring  Per ICU Intensivist recommendations transitioning pt off insulin gtt due to closure of anion gap-20 units lantus bid and SSI   Will start sodium bicarb gtt @125  ml/hr Repeat VBG and BMP pending  -9/30- increased levemir Halfway to 25 bid  Altered mental status with encephalopathy  -neurology on case - appreciate input  - acyclovir started  - CT guided LP and CTA/V of head planned   - thyroid  workup  - patient on seroquel from home  - Neurology following, EEG without evidence of seizure.   Hypothyroidism  TSH - patient on synthroid 300 per neurology   Acute encephalopathy secondary to metabolic derangements and OMVEH-20  Correct metabolic derangements  Frequent reorientation    Best Practice VTE px: subq lovenox SUP px: pepcid  Code status: Full code   Critical care provider statement:    Critical care time (minutes):  33   Critical care time was exclusive of:  Separately billable procedures and  treating other patients   Critical care was necessary to treat or prevent imminent or  life-threatening deterioration of the following conditions:  acute hypoxemic respiratory failure , covid19 pneumonia, DKA.   Critical care was time spent personally by me on the following  activities:  Development of treatment plan with patient or surrogate,  discussions with consultants, evaluation of patient's response to  treatment, examination of patient, obtaining history from patient or  surrogate, ordering and performing treatments and interventions, ordering  and review of laboratory studies and re-evaluation of patient's condition  I assumed direction of critical care for this patient from another  provider in my specialty: no    Jorje Guild, M.D., M.P.H.  Pulmonary & Critical Care Medicine

## 2020-02-05 NOTE — Progress Notes (Signed)
Silver colored ring with multiple clear stones that was located on patient's Right ring finger was removed due to increased swelling around patient's fingers and knuckles.  Ring placed and sealed in clear plastic bag with pt's label.  Will contact husband to pick up ring if possible.

## 2020-02-06 DIAGNOSIS — U071 COVID-19: Secondary | ICD-10-CM | POA: Diagnosis not present

## 2020-02-06 DIAGNOSIS — J9601 Acute respiratory failure with hypoxia: Secondary | ICD-10-CM | POA: Diagnosis not present

## 2020-02-06 DIAGNOSIS — G9341 Metabolic encephalopathy: Secondary | ICD-10-CM | POA: Diagnosis not present

## 2020-02-06 LAB — CBC WITH DIFFERENTIAL/PLATELET
Abs Immature Granulocytes: 0.28 10*3/uL — ABNORMAL HIGH (ref 0.00–0.07)
Basophils Absolute: 0 10*3/uL (ref 0.0–0.1)
Basophils Relative: 0 %
Eosinophils Absolute: 0 10*3/uL (ref 0.0–0.5)
Eosinophils Relative: 0 %
HCT: 36.2 % (ref 36.0–46.0)
Hemoglobin: 11.1 g/dL — ABNORMAL LOW (ref 12.0–15.0)
Immature Granulocytes: 2 %
Lymphocytes Relative: 2 %
Lymphs Abs: 0.3 10*3/uL — ABNORMAL LOW (ref 0.7–4.0)
MCH: 30.6 pg (ref 26.0–34.0)
MCHC: 30.7 g/dL (ref 30.0–36.0)
MCV: 99.7 fL (ref 80.0–100.0)
Monocytes Absolute: 0.8 10*3/uL (ref 0.1–1.0)
Monocytes Relative: 6 %
Neutro Abs: 13.2 10*3/uL — ABNORMAL HIGH (ref 1.7–7.7)
Neutrophils Relative %: 90 %
Platelets: 169 10*3/uL (ref 150–400)
RBC: 3.63 MIL/uL — ABNORMAL LOW (ref 3.87–5.11)
RDW: 15.8 % — ABNORMAL HIGH (ref 11.5–15.5)
WBC: 14.7 10*3/uL — ABNORMAL HIGH (ref 4.0–10.5)
nRBC: 0.1 % (ref 0.0–0.2)

## 2020-02-06 LAB — GLUCOSE, CAPILLARY
Glucose-Capillary: 268 mg/dL — ABNORMAL HIGH (ref 70–99)
Glucose-Capillary: 333 mg/dL — ABNORMAL HIGH (ref 70–99)
Glucose-Capillary: 355 mg/dL — ABNORMAL HIGH (ref 70–99)
Glucose-Capillary: 402 mg/dL — ABNORMAL HIGH (ref 70–99)
Glucose-Capillary: 377 mg/dL — ABNORMAL HIGH (ref 70–99)
Glucose-Capillary: 372 mg/dL — ABNORMAL HIGH (ref 70–99)

## 2020-02-06 LAB — PHOSPHORUS: Phosphorus: 3.3 mg/dL (ref 2.5–4.6)

## 2020-02-06 LAB — BASIC METABOLIC PANEL
Anion gap: 7 (ref 5–15)
BUN: 28 mg/dL — ABNORMAL HIGH (ref 6–20)
CO2: 28 mmol/L (ref 22–32)
Calcium: 7.6 mg/dL — ABNORMAL LOW (ref 8.9–10.3)
Chloride: 106 mmol/L (ref 98–111)
Creatinine, Ser: 1.25 mg/dL — ABNORMAL HIGH (ref 0.44–1.00)
GFR calc non Af Amer: 50 mL/min — ABNORMAL LOW (ref >60)
Glucose, Bld: 300 mg/dL — ABNORMAL HIGH (ref 70–99)
Potassium: 4.1 mmol/L (ref 3.5–5.1)
Sodium: 141 mmol/L (ref 135–145)

## 2020-02-06 LAB — MAGNESIUM: Magnesium: 2.3 mg/dL (ref 1.7–2.4)

## 2020-02-06 MED ORDER — INSULIN DETEMIR 100 UNIT/ML ~~LOC~~ SOLN
27.0000 [IU] | Freq: Two times a day (BID) | SUBCUTANEOUS | Status: DC
  Administered 2020-02-06: 27 [IU] via SUBCUTANEOUS
  Filled 2020-02-06 (×2): qty 0.27

## 2020-02-06 MED ORDER — INSULIN REGULAR(HUMAN) IN NACL 100-0.9 UT/100ML-% IV SOLN
INTRAVENOUS | Status: DC
  Administered 2020-02-06: 7 [IU]/h via INTRAVENOUS
  Administered 2020-02-07: 11.5 [IU]/h via INTRAVENOUS
  Filled 2020-02-06: qty 100

## 2020-02-06 MED ORDER — INSULIN ASPART 100 UNIT/ML ~~LOC~~ SOLN
5.0000 [IU] | SUBCUTANEOUS | Status: DC
  Administered 2020-02-06: 5 [IU] via SUBCUTANEOUS
  Filled 2020-02-06: qty 1

## 2020-02-06 MED ORDER — INSULIN ASPART 100 UNIT/ML ~~LOC~~ SOLN
3.0000 [IU] | SUBCUTANEOUS | Status: DC
  Administered 2020-02-06: 4 [IU] via SUBCUTANEOUS
  Administered 2020-02-06: 3 [IU] via SUBCUTANEOUS
  Filled 2020-02-06 (×2): qty 1

## 2020-02-06 MED ORDER — DEXTROSE 50 % IV SOLN: 0.0000 mL | INTRAVENOUS | Status: DC | PRN

## 2020-02-06 MED ORDER — DILTIAZEM HCL-DEXTROSE 125-5 MG/125ML-% IV SOLN (PREMIX)
5.0000 mg/h | INTRAVENOUS | Status: DC
  Administered 2020-02-06: 5 mg/h via INTRAVENOUS
  Administered 2020-02-06 – 2020-02-08 (×5): 15 mg/h via INTRAVENOUS
  Filled 2020-02-06 (×6): qty 125

## 2020-02-06 NOTE — Progress Notes (Addendum)
   02/06/20 1853  Clinical Encounter Type  Visited With Patient and family together  Visit Type Initial  Referral From Nurse  Consult/Referral To Chaplain  Chaplain responded to a PG to support family. When chaplain arrived husband and daughter were at Pt's bedside. Chaplain had been told that daughter was very emotional and might need a Clinical biochemist. Chaplain stood on the outside of the room praying.

## 2020-02-06 NOTE — Progress Notes (Signed)
Name: Marissa Cook MRN: [id number] DOB: 1965-05-28    ADMISSION DATE:  01/25/2020 INITIAL CONSULTATION DATE: 01/30/2020  REFERRING MD : Dr. Sidney Ace   CHIEF COMPLAINT: AMS   BRIEF PATIENT DESCRIPTION:  54 yo female admitted with acute metabolic encephalopathy, acute metabolic acidosis, DKA, and COVID-19 pneumonia   SIGNIFICANT EVENTS/STUDIES:  09/28: CT Head revealed no acute intracranial findings. Hypoattenuating focus in the right basal ganglia compatible with remote lacunar infarct versus prominent perivascular space as detailed on comparison. 09/28: CTA Chest revealed no pulmonary embolus or acute aortic syndrome. Multifocal, peripheral predominant ground glass opacities, most consistent with multifocal pneumonia, including viral pneumonia. 09/29: Pt admitted to stepdown unit per hospitalist team, however remained in the ER pending bed availability 09/29:  PCCM team consulted to assist with management of COVID pneumonia and acute metabolic encephalopathy 0/62:  Patient is weaned to 35% on ventilator, weaned off sedation with mentation slowly waking up but not following verbal communication yet. Will hold off on EEG for now due to SBT and awakening trial.  Some concern for possible seizure activity overnight, we are monitoring closely and will perform EEG if any re-emergence of symptoms is noted.  02/01/20- patient is liberated from MV, she still is encephalopathic its unclear if she had central event.  I discussed case with neurology and there is plan for repeat EEG.   10/3- patient got re-intubated overnight due to encephalopathy which seems to be chronic recurrent.  I have discussed with husband most recent events and care plan 10/4- patient remains critically ill, neurologic evaluation on going 10/5- Worsening hypoxia when patient turned supine from prone, requiring 100% FiO2 and deep sedation/paralysis 10/6-not tolerating proning, looks miserable on the vent,agonal  breathing  PATIENT PROFILE:   This is a 54 yo female with a PMH of Acute Pancreatitis, Hypothyroidism, HLD, Type II Diabetes Mellitus, HTN, H Pylori, Depression, Anemia, DDD, and Fatty Liver Disease.  She presented to [facility] ER via EMS on 09/28 with altered mental status and c/o "not feeling well."  Upon arrival to the ER pt alert to self only.  She was prescribed azithromycin and prednisone due to acute respiratory failure by her PCP on 09/26 while awaiting for final COVID-19 results.  In the ER lab results ruled pt in for DKA and metabolic acidosis, therefore insulin gtt initiated.  COVID-19 positive and CXR concerning for pulmonary edema vs. multifocal pneumonia.   CTA Chest negative for PE or acute aortic syndrome, but concerning for multifocal pneumonia.  CT Head negative for acute intracranial findings.  She was subsequently admitted to the stepdown unit per hospitalist team for additional workup and treatment, but remained in the ER pending bed availability.  PCCM team consulted on 09/29 to assist with management of COVID pneumonia and acute metabolic encephalopathy.    Scheduled Meds: . vitamin C  500 mg Per Tube Daily  . chlorhexidine gluconate (MEDLINE KIT)  15 mL Mouth Rinse BID  . Chlorhexidine Gluconate Cloth  6 each Topical Daily  . cholecalciferol  1,000 Units Per Tube Daily  . dexamethasone (DECADRON) injection  6 mg Intravenous Q24H  . docusate  100 mg Per Tube BID  . enoxaparin (LOVENOX) injection  40 mg Subcutaneous Q24H  . feeding supplement (PROSource TF)  45 mL Per Tube BID  . fentaNYL (SUBLIMAZE) injection  50 mcg Intravenous Once  . insulin aspart  0-20 Units Subcutaneous Q4H  . insulin aspart  3 Units Subcutaneous Q4H  . insulin detemir  25 Units Subcutaneous BID  .  levothyroxine  300 mcg Per Tube Q0600  . mouth rinse  15 mL Mouth Rinse 10 times per day  . polyethylene glycol  17 g Per Tube Daily  . QUEtiapine  100 mg Per Tube QHS  . sodium chloride flush  10-40 mL  Intracatheter Q12H  . sodium chloride flush  10-40 mL Intracatheter Q12H  . thiamine injection  100 mg Intravenous Daily  . vecuronium  10 mg Intravenous Once  . zinc sulfate  220 mg Per Tube Daily   Continuous Infusions: . sodium chloride 10 mL/hr at 02/06/20 0500  . acyclovir 815 mg (02/06/20 1400)  . diltiazem (CARDIZEM) infusion 5 mg/hr (02/06/20 1433)  . famotidine (PEPCID) IV 20 mg (02/06/20 0848)  . feeding supplement (VITAL HIGH PROTEIN) 1,000 mL (02/06/20 0525)  . fentaNYL infusion INTRAVENOUS 200 mcg/hr (02/06/20 0540)  . lactated ringers 50 mL/hr at 02/06/20 0847  . phenylephrine (NEO-SYNEPHRINE) Adult infusion 10 mcg/min (02/06/20 0500)  . propofol (DIPRIVAN) infusion 20 mcg/kg/min (02/06/20 0840)   PRN Meds:.acetaminophen, acetaminophen, chlorpheniramine-HYDROcodone, fentaNYL, guaiFENesin-dextromethorphan, magnesium hydroxide, midazolam, ondansetron **OR** ondansetron (ZOFRAN) IV, polyethylene glycol, sodium chloride flush, sodium chloride flush, vecuronium  REVIEW OF SYSTEMS:   Unable to assess pt confused   SUBJECTIVE:  Unable to assess pt confused   VITAL SIGNS: Temp:  [96.8 F (36 C)-101.3 F (38.5 C)] 101.3 F (38.5 C) (10/06 1600) Pulse Rate:  [129-152] 147 (10/06 1600) Resp:  [23-36] 34 (10/06 1600) BP: (120-158)/(65-91) 138/65 (10/06 1600) SpO2:  [92 %-99 %] 97 % (10/06 1600) Arterial Line BP: (79-139)/(50-76) 109/61 (10/06 1600) FiO2 (%):  [65 %-100 %] 65 % (10/06 1602) Weight:  [86.1 kg] 86.1 kg (10/06 0500)  Vent Mode: PRVC FiO2 (%):  [65 %-100 %] 65 % Set Rate:  [30 bmp-34 bmp] 34 bmp Vt Set:  [500 mL] 500 mL PEEP:  [15 cmH20] 15 cmH20 Plateau Pressure:  [28 cmH20-35 cmH20] 35 cmH20   PHYSICAL EXAMINATION: Physical examination is limited due to need for PPE/CAPR General: Critically ill appearing female, agonal type breathing on the vent Neuro: Obtunded  HEENT: supple, no JVD, periorbital edema noted Cardiovascular: Supraventricular  tachycardia on monitor Lungs: Diminished, coarse, difficult to assess due to CAPR Abdomen: hypoactive BS X4, obese, soft  Musculoskeletal: normal bulk and tone, no edema  Skin: intact no rashes or lesions present   Recent Labs  Lab 02/04/20 0343 02/05/20 0342 02/06/20 0521  NA 145 144 141  K 3.2* 3.7 4.1  CL 107 107 106  CO2 _0 BUN 16 17 28*  CREATININE 0.77 0.81 1.25*  GLUCOSE 113* 172* 300*   Recent Labs  Lab 02/04/20 0343 02/05/20 0342 02/06/20 0521  HGB 14.5 12.2 11.1*  HCT 46.0 38.6 36.2  WBC 20.3* 16.0* 14.7*  PLT 184 146* 169   CT ANGIO HEAD W OR WO CONTRAST  Result Date: 02/05/2020 CLINICAL DATA:  Altered mental status, COVID pneumonia EXAM: CT ANGIOGRAPHY HEAD CT VENOGRAM HEAD TECHNIQUE: Multidetector CT imaging of the head was performed using the standard protocol before and during bolus administration of intravenous contrast. Postcontrast imaging was repeated in the venous phase. Multiplanar CT image reconstructions and MIPs were obtained to evaluate the vascular anatomy. CONTRAST:  36m OMNIPAQUE IOHEXOL 350 MG/ML SOLN COMPARISON:  CT head 01/24/2020 FINDINGS: CT HEAD Brain: There is no acute intracranial hemorrhage, mass effect, or edema. Gray-white differentiation is preserved. Unchanged prominent perivascular space or chronic small vessel infarct of the right lentiform nucleus. There is no extra-axial fluid collection.  Ventricles and sulci are within normal limits in size and configuration. Vascular: Better evaluated on CTA portion. Skull: Calvarium is unremarkable. Sinuses/Orbits: No acute finding. Other: None. CTA HEAD Anterior circulation: Intracranial internal carotid arteries are patent. Anterior and middle cerebral arteries are patent. Posterior circulation: Intracranial vertebral, basilar, and posterior cerebral arteries are patent. There is a right posterior communicating artery. CT VENOGRAM Superior sagittal sinus, straight sinus, vein of Galen, internal  cerebral veins, and transverse and sigmoid sinuses are patent. No evidence of venous thrombus. IMPRESSION: No acute intracranial abnormality. Unremarkable vascular imaging of the head. Electronically Signed   By: Macy Mis M.D.   On: 02/05/2020 14:50   DG Chest Port 1 View  Result Date: 02/05/2020 CLINICAL DATA:  Central line placement EXAM: PORTABLE CHEST 1 VIEW COMPARISON:  02/04/2020 FINDINGS: There has been interval placement of a left-sided central venous catheter. The tip courses across the midline and projects over the right lung apex, presumably within the right brachiocephalic vein. The endotracheal tube terminates above the carina. The enteric tube extends below the left hemidiaphragm. Hazy bilateral airspace opacities are noted, worsening since prior study. There is no definite pneumothorax. The heart size is stable. There is no acute osseous abnormality. IMPRESSION: 1. Interval placement of a left-sided central venous catheter as above with the tip coursing across the midline and projects over the right lung apex, presumably within the right brachiocephalic vein. 2. No definite pneumothorax. 3. Persistent hazy bilateral airspace opacities which have worsened since the prior study, especially at the right lung base. Electronically Signed   By: Constance Holster M.D.   On: 02/05/2020 02:23   CT VENOGRAM HEAD  Result Date: 02/05/2020 CLINICAL DATA:  Altered mental status, COVID pneumonia EXAM: CT ANGIOGRAPHY HEAD CT VENOGRAM HEAD TECHNIQUE: Multidetector CT imaging of the head was performed using the standard protocol before and during bolus administration of intravenous contrast. Postcontrast imaging was repeated in the venous phase. Multiplanar CT image reconstructions and MIPs were obtained to evaluate the vascular anatomy. CONTRAST:  44m OMNIPAQUE IOHEXOL 350 MG/ML SOLN COMPARISON:  CT head 01/16/2020 FINDINGS: CT HEAD Brain: There is no acute intracranial hemorrhage, mass effect, or  edema. Gray-white differentiation is preserved. Unchanged prominent perivascular space or chronic small vessel infarct of the right lentiform nucleus. There is no extra-axial fluid collection. Ventricles and sulci are within normal limits in size and configuration. Vascular: Better evaluated on CTA portion. Skull: Calvarium is unremarkable. Sinuses/Orbits: No acute finding. Other: None. CTA HEAD Anterior circulation: Intracranial internal carotid arteries are patent. Anterior and middle cerebral arteries are patent. Posterior circulation: Intracranial vertebral, basilar, and posterior cerebral arteries are patent. There is a right posterior communicating artery. CT VENOGRAM Superior sagittal sinus, straight sinus, vein of Galen, internal cerebral veins, and transverse and sigmoid sinuses are patent. No evidence of venous thrombus. IMPRESSION: No acute intracranial abnormality. Unremarkable vascular imaging of the head. Electronically Signed   By: PMacy MisM.D.   On: 02/05/2020 14:50   [place] SITE RITE  Result Date: 02/05/2020 If Site Rite image not attached, placement could not be confirmed due to current cardiac rhythm.  DG FL GUIDED LUMBAR PUNCTURE  Result Date: 02/05/2020 CLINICAL DATA:  DKA.  Altered mental status.  COVID positive. EXAM: DIAGNOSTIC LUMBAR PUNCTURE UNDER FLUOROSCOPIC GUIDANCE FLUOROSCOPY TIME:  Fluoroscopy Time:  0 minutes 18 seconds Radiation Exposure Index (if provided by the fluoroscopic device): 5.0 mGy PROCEDURE: After discussing the risks and benefits of this procedure with the patient's husband including the  risk of infection, bleeding, allergy, and spinal fluid leak informed consent was obtained. Patient's back was sterilely prepped and draped. 22 gauge spinal needle was advanced into the lumbar canal at the L3-L4 level without complication. 9 cc of clear CSF obtained and sent in 4 separate tubes to the laboratory for further evaluation. Hemostasis achieved. IMPRESSION:  Successful fluoroscopically directed lumbar puncture. Electronically Signed   By: Marcello Moores  Register   On: 02/05/2020 14:40    ASSESSMENT / PLAN:  Acute hypoxic respiratory failure secondary to ARDS due to COVID-19  COVID-19 pneumonia Severe respiratory acidosis Respiratory acidosis due to dead space ventilation Patient intubated, obtunded Not tolerating proning Agonal breathing Continue ARDS protocol Will not tolerate further proning Appears terminal Discussed with patient's husband patient now DNR  SVT Magnesium is 2.3 Start Cardizem drip  Diabetic Ketoacidosis  Severe metabolic acidosis in the setting of DKA  Metabolic acidosis has corrected Respiratory acidosis remains due to dead space ventilation as above Continue hyperglycemia protocol  Acute encephalopathy due to COVID-19 Neurology following appreciate input On acyclovir empirically HSV and varicella zoster CSF titers pending Suspect related to COVID-19 Hypothyroidism aggravating issue Encephalopathy in the setting of COVID-19 is poor prognostic sign   Hypothyroidism  Synthroid 300 micrograms daily  Best Practice VTE px: subq lovenox SUP px: pepcid  Code status: DNR  Patient was discussed during multidisciplinary rounds.  I discussed the condition at length with the patient's husband, Merry Proud, this afternoon.  Patient's prognosis is exceedingly poor.  Patient will be DNR.   Total critical care time 45 minutes.   Renold Don, MD [facility]   *This note was dictated using voice recognition software/Dragon.  Despite best efforts to proofread, errors can occur which can change the meaning.  Any change was purely unintentional.

## 2020-02-06 NOTE — Progress Notes (Signed)
Inpatient Diabetes Program Recommendations  AACE/ADA: New Consensus Statement on Inpatient Glycemic Control   Target Ranges:  Prepandial:   less than 140 mg/dL      Peak postprandial:   less than 180 mg/dL (1-2 hours)      Critically ill patients:  140 - 180 mg/dL   Results for Marissa Cook, Marissa Cook (MRN [id number]) as of 02/06/2020 07:42  Ref. Range 02/05/2020 07:41 02/05/2020 11:58 02/05/2020 16:02 02/05/2020 19:43 02/05/2020 23:35 02/06/2020 05:29  Glucose-Capillary Latest Ref Range: 70 - 99 mg/dL 173 (H) 154 (H) 184 (H) 185 (H) 216 (H) 268 (H)   Review of Glycemic Control  Current orders for Inpatient glycemic control: Levemir 25 units BID, Novolog 0-20 units Q4H; Decadron 6 mg Q24H, Vital @ 50 ml/hr  Inpatient Diabetes Program Recommendations:    Insulin: Please consider ordering Novolog 3 units Q4H for tube feeding coverage. If tube feeding is stopped or held then Novolog tube feeding coverage should also be stopped or held.  Thanks, Barnie Alderman, RN, MSN, CDE Diabetes Coordinator Inpatient Diabetes Program [phone] (Team Pager from 8am to 5pm)

## 2020-02-07 ENCOUNTER — Inpatient Hospital Stay: Payer: BC Managed Care – PPO

## 2020-02-07 DIAGNOSIS — U071 COVID-19: Secondary | ICD-10-CM | POA: Diagnosis not present

## 2020-02-07 DIAGNOSIS — J9601 Acute respiratory failure with hypoxia: Secondary | ICD-10-CM | POA: Diagnosis not present

## 2020-02-07 DIAGNOSIS — G9341 Metabolic encephalopathy: Secondary | ICD-10-CM | POA: Diagnosis not present

## 2020-02-07 LAB — BASIC METABOLIC PANEL
Anion gap: 6 (ref 5–15)
Anion gap: 11 (ref 5–15)
Anion gap: 9 (ref 5–15)
BUN: 48 mg/dL — ABNORMAL HIGH (ref 6–20)
BUN: 56 mg/dL — ABNORMAL HIGH (ref 6–20)
BUN: 60 mg/dL — ABNORMAL HIGH (ref 6–20)
CO2: 27 mmol/L (ref 22–32)
CO2: 25 mmol/L (ref 22–32)
CO2: 26 mmol/L (ref 22–32)
Calcium: 7.2 mg/dL — ABNORMAL LOW (ref 8.9–10.3)
Calcium: 8.6 mg/dL — ABNORMAL LOW (ref 8.9–10.3)
Calcium: 8.5 mg/dL — ABNORMAL LOW (ref 8.9–10.3)
Chloride: 106 mmol/L (ref 98–111)
Chloride: 105 mmol/L (ref 98–111)
Chloride: 105 mmol/L (ref 98–111)
Creatinine, Ser: 1.46 mg/dL — ABNORMAL HIGH (ref 0.44–1.00)
Creatinine, Ser: 1.73 mg/dL — ABNORMAL HIGH (ref 0.44–1.00)
Creatinine, Ser: 1.65 mg/dL — ABNORMAL HIGH (ref 0.44–1.00)
GFR calc non Af Amer: 41 mL/min — ABNORMAL LOW (ref >60)
GFR calc non Af Amer: 34 mL/min — ABNORMAL LOW (ref >60)
GFR calc non Af Amer: 36 mL/min — ABNORMAL LOW (ref >60)
Glucose, Bld: 329 mg/dL — ABNORMAL HIGH (ref 70–99)
Glucose, Bld: 190 mg/dL — ABNORMAL HIGH (ref 70–99)
Glucose, Bld: 274 mg/dL — ABNORMAL HIGH (ref 70–99)
Potassium: 3.7 mmol/L (ref 3.5–5.1)
Potassium: 4 mmol/L (ref 3.5–5.1)
Potassium: 4 mmol/L (ref 3.5–5.1)
Sodium: 139 mmol/L (ref 135–145)
Sodium: 141 mmol/L (ref 135–145)
Sodium: 140 mmol/L (ref 135–145)

## 2020-02-07 LAB — CBC WITH DIFFERENTIAL/PLATELET
Abs Immature Granulocytes: 0.6 10*3/uL — ABNORMAL HIGH (ref 0.00–0.07)
Basophils Absolute: 0.1 10*3/uL (ref 0.0–0.1)
Basophils Relative: 0 %
Eosinophils Absolute: 0 10*3/uL (ref 0.0–0.5)
Eosinophils Relative: 0 %
HCT: 33 % — ABNORMAL LOW (ref 36.0–46.0)
Hemoglobin: 10.2 g/dL — ABNORMAL LOW (ref 12.0–15.0)
Immature Granulocytes: 3 %
Lymphocytes Relative: 4 %
Lymphs Abs: 0.7 10*3/uL (ref 0.7–4.0)
MCH: 30.8 pg (ref 26.0–34.0)
MCHC: 30.9 g/dL (ref 30.0–36.0)
MCV: 99.7 fL (ref 80.0–100.0)
Monocytes Absolute: 0.8 10*3/uL (ref 0.1–1.0)
Monocytes Relative: 4 %
Neutro Abs: 18.4 10*3/uL — ABNORMAL HIGH (ref 1.7–7.7)
Neutrophils Relative %: 89 %
Platelets: 221 10*3/uL (ref 150–400)
RBC: 3.31 MIL/uL — ABNORMAL LOW (ref 3.87–5.11)
RDW: 15.3 % (ref 11.5–15.5)
WBC: 20.6 10*3/uL — ABNORMAL HIGH (ref 4.0–10.5)
nRBC: 0.1 % (ref 0.0–0.2)

## 2020-02-07 LAB — MAGNESIUM: Magnesium: 2.5 mg/dL — ABNORMAL HIGH (ref 1.7–2.4)

## 2020-02-07 LAB — GLUCOSE, CAPILLARY
Glucose-Capillary: 350 mg/dL — ABNORMAL HIGH (ref 70–99)
Glucose-Capillary: 387 mg/dL — ABNORMAL HIGH (ref 70–99)
Glucose-Capillary: 332 mg/dL — ABNORMAL HIGH (ref 70–99)
Glucose-Capillary: 283 mg/dL — ABNORMAL HIGH (ref 70–99)
Glucose-Capillary: 275 mg/dL — ABNORMAL HIGH (ref 70–99)
Glucose-Capillary: 279 mg/dL — ABNORMAL HIGH (ref 70–99)
Glucose-Capillary: 260 mg/dL — ABNORMAL HIGH (ref 70–99)
Glucose-Capillary: 233 mg/dL — ABNORMAL HIGH (ref 70–99)
Glucose-Capillary: 190 mg/dL — ABNORMAL HIGH (ref 70–99)
Glucose-Capillary: 190 mg/dL — ABNORMAL HIGH (ref 70–99)
Glucose-Capillary: 165 mg/dL — ABNORMAL HIGH (ref 70–99)
Glucose-Capillary: 158 mg/dL — ABNORMAL HIGH (ref 70–99)
Glucose-Capillary: 151 mg/dL — ABNORMAL HIGH (ref 70–99)
Glucose-Capillary: 138 mg/dL — ABNORMAL HIGH (ref 70–99)
Glucose-Capillary: 170 mg/dL — ABNORMAL HIGH (ref 70–99)
Glucose-Capillary: 161 mg/dL — ABNORMAL HIGH (ref 70–99)
Glucose-Capillary: 269 mg/dL — ABNORMAL HIGH (ref 70–99)

## 2020-02-07 LAB — BLOOD GAS, ARTERIAL
Acid-base deficit: 2.3 mmol/L — ABNORMAL HIGH (ref 0.0–2.0)
Bicarbonate: 26.1 mmol/L (ref 20.0–28.0)
FIO2: 0.5
MECHVT: 430 mL
O2 Saturation: 98.2 %
PEEP: 5 cmH2O
Patient temperature: 37
RATE: 30 resp/min
pCO2 arterial: 61 mmHg — ABNORMAL HIGH (ref 32.0–48.0)
pH, Arterial: 7.24 — ABNORMAL LOW (ref 7.350–7.450)
pO2, Arterial: 124 mmHg — ABNORMAL HIGH (ref 83.0–108.0)

## 2020-02-07 LAB — PHOSPHORUS: Phosphorus: 2.4 mg/dL — ABNORMAL LOW (ref 2.5–4.6)

## 2020-02-07 LAB — PROCALCITONIN: Procalcitonin: 1.08 ng/mL

## 2020-02-07 MED ORDER — INSULIN ASPART 100 UNIT/ML ~~LOC~~ SOLN
3.0000 [IU] | SUBCUTANEOUS | Status: DC
  Administered 2020-02-07: 6 [IU] via SUBCUTANEOUS
  Filled 2020-02-07 (×2): qty 1

## 2020-02-07 MED ORDER — POTASSIUM & SODIUM PHOSPHATES 280-160-250 MG PO PACK
1.0000 | PACK | Freq: Once | ORAL | Status: AC
  Administered 2020-02-07: 1
  Filled 2020-02-07: qty 1

## 2020-02-07 MED ORDER — INSULIN DETEMIR 100 UNIT/ML ~~LOC~~ SOLN
15.0000 [IU] | Freq: Two times a day (BID) | SUBCUTANEOUS | Status: DC
  Administered 2020-02-07: 15 [IU] via SUBCUTANEOUS
  Filled 2020-02-07 (×2): qty 0.15

## 2020-02-07 MED ORDER — PHENYLEPHRINE CONCENTRATED 100MG/250ML (0.4 MG/ML) INFUSION SIMPLE
0.0000 ug/min | INTRAVENOUS | Status: DC
  Administered 2020-02-07: 250 ug/min via INTRAVENOUS
  Administered 2020-02-07: 50 ug/min via INTRAVENOUS
  Filled 2020-02-07 (×3): qty 250

## 2020-02-07 MED ORDER — INSULIN ASPART 100 UNIT/ML ~~LOC~~ SOLN
0.0000 [IU] | SUBCUTANEOUS | Status: DC
  Administered 2020-02-07: 11 [IU] via SUBCUTANEOUS
  Administered 2020-02-08 (×2): 20 [IU] via SUBCUTANEOUS
  Administered 2020-02-08 (×2): 15 [IU] via SUBCUTANEOUS
  Administered 2020-02-08: 16 [IU] via SUBCUTANEOUS
  Administered 2020-02-09: 15 [IU] via SUBCUTANEOUS
  Administered 2020-02-09: 11 [IU] via SUBCUTANEOUS
  Administered 2020-02-09: 7 [IU] via SUBCUTANEOUS
  Administered 2020-02-09: 6 [IU] via SUBCUTANEOUS
  Administered 2020-02-09 – 2020-02-10 (×3): 7 [IU] via SUBCUTANEOUS
  Administered 2020-02-10 (×2): 4 [IU] via SUBCUTANEOUS
  Administered 2020-02-10: 15 [IU] via SUBCUTANEOUS
  Administered 2020-02-10: 11 [IU] via SUBCUTANEOUS
  Filled 2020-02-07 (×16): qty 1

## 2020-02-07 MED ORDER — DEXTROSE 10 % IV SOLN: INTRAVENOUS | Status: DC | PRN

## 2020-02-07 MED ORDER — INSULIN ASPART 100 UNIT/ML ~~LOC~~ SOLN
5.0000 [IU] | SUBCUTANEOUS | Status: DC
  Administered 2020-02-07: 5 [IU] via SUBCUTANEOUS
  Filled 2020-02-07 (×2): qty 1

## 2020-02-07 MED ORDER — SODIUM CHLORIDE 0.9 % IV SOLN
0.0000 ug/min | INTRAVENOUS | Status: DC
  Administered 2020-02-07 (×2): 250 ug/min via INTRAVENOUS
  Administered 2020-02-08: 110 ug/min via INTRAVENOUS
  Administered 2020-02-09: 200 ug/min via INTRAVENOUS
  Administered 2020-02-09: 400 ug/min via INTRAVENOUS
  Filled 2020-02-07 (×6): qty 10

## 2020-02-07 MED ORDER — INSULIN DETEMIR 100 UNIT/ML ~~LOC~~ SOLN
15.0000 [IU] | Freq: Two times a day (BID) | SUBCUTANEOUS | Status: DC
  Administered 2020-02-08: 15 [IU] via SUBCUTANEOUS
  Filled 2020-02-07 (×2): qty 0.15

## 2020-02-07 MED ORDER — INSULIN DETEMIR 100 UNIT/ML ~~LOC~~ SOLN
17.0000 [IU] | Freq: Two times a day (BID) | SUBCUTANEOUS | Status: DC
  Filled 2020-02-07: qty 0.17

## 2020-02-07 MED ORDER — DEXTROSE 5 % IV SOLN
10.0000 mg/kg | Freq: Two times a day (BID) | INTRAVENOUS | Status: DC
  Administered 2020-02-07 – 2020-02-08 (×3): 815 mg via INTRAVENOUS
  Filled 2020-02-07 (×5): qty 16.3

## 2020-02-07 NOTE — Progress Notes (Signed)
PHARMACY CONSULT NOTE  Pharmacy Consult for Electrolyte Monitoring and Replacement   Recent Labs: Potassium (mmol/L)  Date Value  02/07/2020 3.7  01/17/2012 3.8   Magnesium (mg/dL)  Date Value  02/07/2020 2.5 (H)   Calcium (mg/dL)  Date Value  02/07/2020 7.2 (L)   Calcium, Total (mg/dL)  Date Value  01/17/2012 9.5   Albumin (g/dL)  Date Value  02/04/2020 2.4 (L)   Phosphorus (mg/dL)  Date Value  02/07/2020 2.4 (L)   Sodium (mmol/L)  Date Value  02/07/2020 139  01/17/2012 139   Corrected Ca: 8.5 mg/dL  Assessment: 54 year old female with PMHx of anxiety, depression, DM, hypothyroidism, HLD admitted with COVID-19 PNA, DKA, acute metabolic encephalopathy. She is currently on an insulin infusion  Goal of Therapy:  Electrolytes WNL  Plan:   P[place] 1 packet per tube x 1  BMP every 6 hours while on an insulin infusion  phosphorous and magnesium re-check in am  Dallie Piles ,PharmD Clinical Pharmacist 02/07/2020 12:34 PM

## 2020-02-07 NOTE — Progress Notes (Signed)
CRITICAL CARE NOTE  PATIENT PROFILE:   This is a 54 yo female with a PMH of Acute Pancreatitis, Hypothyroidism, HLD, Type II Diabetes Mellitus, HTN, H Pylori, Depression, Anemia, DDD, and Fatty Liver Disease.  She presented to [facility] ER via EMS on 09/28 with altered mental status and c/o "not feeling well."  Upon arrival to the ER pt alert to self only.  She was prescribed azithromycin and prednisone due to acute respiratory failure by her PCP on 09/26 while awaiting for final COVID-19 results.  In the ER lab results ruled pt in for DKA and metabolic acidosis, therefore insulin gtt initiated.  COVID-19 positive and CXR concerning for  multifocal pneumonia.   CTA Chest negative for PE or acute aortic syndrome, but concerning for multifocal pneumonia.  CT Head negative for acute intracranial findings.  She was subsequently admitted to the stepdown unit per hospitalist team for additional workup and treatment, but remained in the ER pending bed availability.  PCCM team consulted on 09/29 to assist with management of COVID pneumonia and acute metabolic encephalopathy.     54 yo female admitted with acute metabolic encephalopathy, acute metabolic acidosis, DKA, and COVID-19 pneumonia   SIGNIFICANT EVENTS/STUDIES:  09/28: CT Head revealed no acute intracranial findings. Hypoattenuating focus in the right basal ganglia compatible with remote lacunar infarct versus prominent perivascular space as detailed on comparison. 09/28: CTA Chest revealed no pulmonary embolus or acute aortic syndrome. Multifocal, peripheral predominant ground glass opacities, most consistent with multifocal pneumonia, including viral pneumonia. 09/29: Pt admitted to stepdown unit per hospitalist team, however remained in the ER pending bed availability 09/29:  PCCM team consulted to assist with management of COVID pneumonia and acute metabolic encephalopathy 4/66:  Patient is weaned to 35% on ventilator, weaned off sedation with  mentation slowly waking up but not following verbal communication yet. Will hold off on EEG for now due to SBT and awakening trial.  Some concern for possible seizure activity overnight, we are monitoring closely and will perform EEG if any re-emergence of symptoms is noted.  02/01/20- patient is liberated from MV, she still is encephalopathic its unclear if she had central event.  I discussed case with neurology and there is plan for repeat EEG.   10/3- patient got re-intubated overnight due to encephalopathy which seems to be chronic recurrent.  I have discussed with husband most recent events and care plan 10/4- patient remains critically ill, neurologic evaluation on going 10/5- Worsening hypoxia when patient turned supine from prone, requiring 100% FiO2 and deep sedation/paralysis 10/6-not tolerating proning, looks miserable on the vent,agonal breathing   CC  follow up respiratory failure  SUBJECTIVE Patient remains critically ill Prognosis is guarded  Vent Mode: PRVC FiO2 (%):  [65 %] 65 % Set Rate:  [34 bmp] 34 bmp Vt Set:  [500 mL] 500 mL PEEP:  [15 cmH20] 15 cmH20 Plateau Pressure:  [30 cmH20-35 cmH20] 33 cmH20  BP 106/63   Pulse (!) 116   Temp (!) 100.6 F (38.1 C)   Resp (!) 34   Ht 5' 2.99" (1.6 m)   Wt 88.9 kg   SpO2 97%   BMI 34.73 kg/m    I/O last 3 completed shifts: In: 6180.9 [I.V.:4140.5; Other:10; ZL/DJ:5701.7; IV Piggyback:616.2] Out: 1735 [Urine:1735] No intake/output data recorded.  SpO2: 97 % O2 Flow Rate (L/min): 15 L/min FiO2 (%): 65 %  Estimated body mass index is 34.73 kg/m as calculated from the following:   Height as of this encounter: 5'  2.99" (1.6 m).   Weight as of this encounter: 88.9 kg.  CBC    Component Value Date/Time   WBC 20.6 (H) 02/07/2020 0313   RBC 3.31 (L) 02/07/2020 0313   HGB 10.2 (L) 02/07/2020 0313   HCT 33.0 (L) 02/07/2020 0313   PLT 221 02/07/2020 0313   MCV 99.7 02/07/2020 0313   MCH 30.8 02/07/2020 0313    MCHC 30.9 02/07/2020 0313   RDW 15.3 02/07/2020 0313   LYMPHSABS 0.7 02/07/2020 0313   MONOABS 0.8 02/07/2020 0313   EOSABS 0.0 02/07/2020 0313   BASOSABS 0.1 02/07/2020 0313   BMP Latest Ref Rng & Units 02/07/2020 02/06/2020 02/05/2020  Glucose 70 - 99 mg/dL 329(H) 300(H) 172(H)  BUN 6 - 20 mg/dL 48(H) 28(H) 17  Creatinine 0.44 - 1.00 mg/dL 1.46(H) 1.25(H) 0.81  Sodium 135 - 145 mmol/L 139 141 144  Potassium 3.5 - 5.1 mmol/L 3.7 4.1 3.7  Chloride 98 - 111 mmol/L 106 106 107  CO2 22 - 32 mmol/L 27 28 29   Calcium 8.9 - 10.3 mg/dL 7.2(L) 7.6(L) 7.2(L)      REVIEW OF SYSTEMS  PATIENT IS UNABLE TO PROVIDE COMPLETE REVIEW OF SYSTEMS DUE TO SEVERE CRITICAL ILLNESS        PHYSICAL EXAMINATION:  GENERAL:critically ill appearing, +resp distress PULMONARY: +rhonchi, +wheezing CARDIOVASCULAR: S1 and S2. Regular rate and rhythm. No murmurs, rubs, or gallops.  GASTROINTESTINAL: Soft, nontender, -distended.  Positive bowel sounds.   MUSCULOSKELETAL: No swelling, clubbing, or edema.  NEUROLOGIC: obtunded, GCS<8 SKIN:intact,warm,dry  MEDICATIONS: I have reviewed all medications and confirmed regimen as documented   CULTURE RESULTS   Recent Results (from the past 240 hour(s))  CULTURE, BLOOD (ROUTINE X 2) w Reflex to ID Panel     Status: None   Collection Time: 01/23/2020  8:49 PM   Specimen: BLOOD  Result Value Ref Range Status   Specimen Description BLOOD BLOOD LEFT ARM  Final   Special Requests   Final    BOTTLES DRAWN AEROBIC AND ANAEROBIC Blood Culture adequate volume   Culture   Final    NO GROWTH 5 DAYS Performed at [facility], [address], [place], [place] [address]    Report Status 02/04/2020 FINAL  Final  Respiratory Panel by RT PCR (Flu A&B, Covid) - Nasopharyngeal Swab     Status: Abnormal   Collection Time: 01/03/2020  8:51 PM   Specimen: Nasopharyngeal Swab  Result Value Ref Range Status   SARS Coronavirus 2 by RT PCR POSITIVE (A) NEGATIVE Final     Comment: RESULT CALLED TO, READ BACK BY AND VERIFIED WITH: CHRISSY BRAND @ 2337 01/14/2020 RH (NOTE) SARS-CoV-2 target nucleic acids are DETECTED.  SARS-CoV-2 RNA is generally detectable in upper respiratory specimens  during the acute phase of infection. Positive results are indicative of the presence of the identified virus, but do not rule out bacterial infection or co-infection with other pathogens not detected by the test. Clinical correlation with patient history and other diagnostic information is necessary to determine patient infection status. The expected result is Negative.  Fact Sheet for Patients:  PinkCheek.be  Fact Sheet for Healthcare Providers: GravelBags.it  This test is not yet approved or cleared by the [place] FDA and  has been authorized for detection and/or diagnosis of SARS-CoV-2 by FDA under an Emergency Use Authorization (EUA).  This EUA will remain in effect (meaning this test can be u sed) for the duration of  the COVID-19 declaration under Section 564(b)(1) of  the Act, 21 U.S.C. section 360bbb-3(b)(1), unless the authorization is terminated or revoked sooner.      Influenza A by PCR NEGATIVE NEGATIVE Final   Influenza B by PCR NEGATIVE NEGATIVE Final    Comment: (NOTE) The Xpert Xpress SARS-CoV-2/FLU/RSV assay is intended as an aid in  the diagnosis of influenza from Nasopharyngeal swab specimens and  should not be used as a sole basis for treatment. Nasal washings and  aspirates are unacceptable for Xpert Xpress SARS-CoV-2/FLU/RSV  testing.  Fact Sheet for Patients: PinkCheek.be  Fact Sheet for Healthcare Providers: GravelBags.it  This test is not yet approved or cleared by the [place] FDA and  has been authorized for detection and/or diagnosis of SARS-CoV-2 by  FDA under an Emergency Use Authorization (EUA). This EUA  will remain  in effect (meaning this test can be used) for the duration of the  Covid-19 declaration under Section 564(b)(1) of the Act, 21  U.S.C. section 360bbb-3(b)(1), unless the authorization is  terminated or revoked. Performed at [facility], [address] [place], [place] [address]   CULTURE, BLOOD (ROUTINE X 2) w Reflex to ID Panel     Status: None   Collection Time: 01/21/2020  8:51 PM   Specimen: BLOOD  Result Value Ref Range Status   Specimen Description BLOOD RIGHT ANTECUBITAL  Final   Special Requests   Final    BOTTLES DRAWN AEROBIC AND ANAEROBIC Blood Culture adequate volume   Culture   Final    NO GROWTH 5 DAYS Performed at [facility], [address] [place], [place] [address]    Report Status 02/04/2020 FINAL  Final  MRSA PCR Screening     Status: None   Collection Time: 01/30/20  5:49 PM   Specimen: Nasopharyngeal  Result Value Ref Range Status   MRSA by PCR NEGATIVE NEGATIVE Final    Comment:        The GeneXpert MRSA Assay (FDA approved for NASAL specimens only), is one component of a comprehensive MRSA colonization surveillance program. It is not intended to diagnose MRSA infection nor to guide or monitor treatment for MRSA infections. Performed at [facility], [address] [place], [place] [address]   Culture, respiratory     Status: None   Collection Time: 01/31/20  8:31 AM  Result Value Ref Range Status   Specimen Description   Final    EXPECTORATED SPUTUM Performed at [facility], [address], [place], [place] [address]    Special Requests   Final    NONE Performed at [facility], [address] [place], [place] [address]    Gram Stain   Final    FEW WBC PRESENT, PREDOMINANTLY PMN MODERATE GRAM POSITIVE COCCI RARE GRAM NEGATIVE RODS    Culture   Final    FEW Consistent with normal respiratory flora. Performed at [facility], [place] [address], [place], [place]  [address]    Report Status 02/02/2020 FINAL  Final  CSF culture     Status: None (Preliminary result)   Collection Time: 02/05/20  2:19 PM   Specimen: PATH Cytology CSF; Cerebrospinal Fluid  Result Value Ref Range Status   Specimen Description   Final    CSF Performed at [facility], [address], [place], [place] [address]    Special Requests   Final    NONE Performed at [facility], [address] [place], [place] [address]    Gram Stain  Final    CYTOSPIN SLIDE WBC PRESENT, PREDOMINANTLY MONONUCLEAR NO ORGANISMS SEEN RED BLOOD CELLS PRESENT Performed at [facility], [address] [place], [place] [address]    Culture   Final    NO GROWTH < 24 HOURS Performed at [facility], [place] [address], [place], [place] [address]    Report Status PENDING  Incomplete          IMAGING    DG Chest Port 1 View  Result Date: 02/07/2020 CLINICAL DATA:  Acute respiratory failure.  Hypoxia. EXAM: PORTABLE CHEST 1 VIEW COMPARISON:  No prior. FINDINGS: Interim removal of left IJ line and placement of right PICC line. Right PICC line tip is at the cavoatrial junction. Endotracheal tube in stable position. NG tube in stable position. Heart size stable. Low lung volumes. Persistent bilateral pulmonary infiltrates, most prominent the right lung base. No pleural effusion or pneumothorax. IMPRESSION: 1. Interim removal of left IJ line placement of right PICC line. Right PICC line tip is at the cavoatrial junction. Endotracheal tube in stable position. 2. Low lung volumes. Persistent bilateral pulmonary infiltrates, most prominent in the right lung base. Electronically Signed   By: [place]   On: 02/07/2020 05:12     Nutrition Status: Nutrition Problem: Inadequate oral intake Etiology: inability to eat Signs/Symptoms: NPO status Interventions: Tube feeding, Prostat, MVI     Indwelling Urinary Catheter continued, requirement due to   Reason to  continue Indwelling Urinary Catheter strict Intake/Output monitoring for hemodynamic instability   Central Line/ continued, requirement due to  Reason to continue [place] of central venous pressure or other hemodynamic parameters and poor IV access   Ventilator continued, requirement due to severe respiratory failure   Ventilator Sedation RASS 0 to -2      ASSESSMENT AND PLAN SYNOPSIS  Acute hypoxemic respiratory failure due to COVID-19 pneumonia / ARDS Mechanical ventilation via ARDS protocol, target PRVC 6 cc/kg Wean PEEP and FiO2 as able Goal plateau pressure less than 30, driving pressure less than 15 Paralytics if necessary for vent synchrony, gas exchange Cycle prone positioning if necessary for oxygenation Deep sedation per PAD protocol, goal RASS -4, currently fentanyl, midazolam Diuresis as blood pressure and renal function can tolerate, goal CVP 5-8.   diuresis as tolerated based on Kidney function  Severe ACUTE Hypoxic and Hypercapnic Respiratory Failure -continue Full MV support -continue Bronchodilator Therapy -Wean Fio2 and PEEP as tolerated -VAP/VENT bundle implementation Unable to wean from vent   ACUTE KIDNEY INJURY/Renal Failure -continue Foley Catheter-assess need -Avoid nephrotoxic agents -Follow urine output, BMP -Ensure adequate renal perfusion, optimize oxygenation -Renal dose medications     NEUROLOGY Acute toxic metabolic encephalopathy, need for sedation Goal RASS -2 to -3  CARDIAC ICU monitoring  ID -continue IV abx as prescibed -follow up cultures  GI GI PROPHYLAXIS as indicated  DIET-->TF's as tolerated Constipation protocol as indicated  ENDO - will use ICU hypoglycemic\Hyperglycemia protocol if indicated     ELECTROLYTES -follow labs as needed -replace as needed -pharmacy consultation and following   DVT/GI PRX ordered and assessed TRANSFUSIONS AS NEEDED MONITOR FSBS I Assessed the need for Labs I  Assessed the need for Foley I Assessed the need for Central Venous Line Family Discussion when available I Assessed the need for Mobilization I made an Assessment of medications to be adjusted accordingly Safety Risk assessment completed   CASE DISCUSSED IN MULTIDISCIPLINARY ROUNDS WITH ICU TEAM  Critical Care Time devoted to patient care  services described in this note is 34 minutes.   Overall, patient is critically ill, prognosis is guarded.  Patient with Multiorgan failure and at high risk for cardiac arrest and death.    Corrin Parker, M.D.  [facility]  Medical Director [place] Director [facility] Cardio-Pulmonary Department

## 2020-02-07 NOTE — Progress Notes (Addendum)
Subjective: Respiratory status worsened on 10/6 but improving today Code status has been updated to DNR  Remains on insulin ggt, cardizem for tachycardia, sedation for ventilator, requiring pressors  Continues to be intermittently febrile  Exam: Vitals:   02/07/20 0600 02/07/20 0900  BP: 106/63 106/64  Pulse: (!) 116 (!) 125  Resp: (!) 34 (!) 28  Temp: (!) 100.6 F (38.1 C) (!) 101.3 F (38.5 C)  SpO2: 97% 97%   Gen: In bed, proned NAD Resp: on ventilator  Abd: soft, nt  Neuro: MS: Does not open eyes follow commands, grimaces slightly to deep suctioning CN: Pupils are reactive bilaterally 3 -> 2 mm, corneals intact, cough intact  Motor: No movement to noxious stimulation in her extremities. Intermittent possible myoclonic jerking of the upper body Sensory: As above  Pertinent Labs: pH 7.18, pCO2 77 on 10/5 at 4 AM --> 7.24 pCO2 61 10/7 AM Ammonia initially 46, then 68 Creatinine 0.77 -> 0.81 Free T4 low at 0.3  WBC 20 10/3 -> 16 on 10/4 -> 20.6 on 10/07  CSF: Glucose 128 (serum 184), WBC 5, RBC 23, Protein 102 VZV and HSV in process   CT, CTA and CTV 10/5 personally reviewed, no acute processes   Impression: 54 year old female with acute encephalopathy in the setting of Covid and hypothyroidism. Her markedly elevated TSH could be suggestive that she had subclinical hypothyroidism which can result in significant encephalopathy after acute physiological stress (which she is currently experiencing). Though there was concern for some seizure-like activity, the description sounds more consistent with myoclonus, and EEG was negative. She has had worsening mental status in the setting of requiring re-intubation and continuing to have a significant respiratory acidosis. This alone may explain her mental status, but CTA CTV were performed to exclude stroke or venous thrombosis in the setting of her acute infection. Fortunately these studies are negative. CSF studies are largely  reassuring; elevated protein is a non-specific finding and can be seen with lumbar stenosis, diabetes, etc. Still, given initially improved fever curve and leucocytosis correlating with acyclovir (and tap several days after acyclovir was started), would continue acyclovir until HSV/VZV result.   At this time do not think repeat EEG is indicated as patient has already had negative EEG for myoclonic activity and mental status is likely explained by her infectious issues and respiratory status.   Recommendations: -continue acyclovir until HSV/VZV results negative -continue treatment of underlying medical issues per CCM. -I will next plan to see patient on 10/9 or 10/10; please page neurology for any acute concerns in the interim   Lesleigh Noe MD-PhD [facility] [phone]   If 8pm- 8am, please page neurology on call as listed in [place].  30 minutes spent in critical care of this patient as documented above.  Addended for charge capture

## 2020-02-07 NOTE — Progress Notes (Signed)
PHARMACY CONSULT NOTE  Pharmacy Consult for Electrolyte Monitoring and Replacement   Recent Labs: Potassium (mmol/L)  Date Value  02/07/2020 4.0  01/17/2012 3.8   Magnesium (mg/dL)  Date Value  02/07/2020 2.5 (H)   Calcium (mg/dL)  Date Value  02/07/2020 8.6 (L)   Calcium, Total (mg/dL)  Date Value  01/17/2012 9.5   Albumin (g/dL)  Date Value  02/04/2020 2.4 (L)   Phosphorus (mg/dL)  Date Value  02/07/2020 2.4 (L)   Sodium (mmol/L)  Date Value  02/07/2020 141  01/17/2012 139   Corrected Ca: 8.5 mg/dL  Assessment: 54 year old female with PMHx of anxiety, depression, DM, hypothyroidism, HLD admitted with COVID-19 PNA, DKA, acute metabolic encephalopathy. She is currently on an insulin infusion  Goal of Therapy:  Electrolytes WNL  Plan:   Potassium WNL, defer replacement at this time  BMP every 6 hours while on an insulin infusion  phosphorous and magnesium re-check in am  Tawnya Crook ,PharmD Clinical Pharmacist 02/07/2020 6:47 PM

## 2020-02-07 NOTE — Progress Notes (Signed)
Ch visited with Pt's mom Marissa Cook as per request by RN who stated that Marissa Cook won't be allowed inside room and needs support. Upon arrival, Ch met Marissa Cook seated on a wheelchair outside Pt's room door looking at Pt. She was tearful; shared with Ch that she does not want to lose her. Marissa Cook shared that it was Pt's birthday tomorrow, and she is scared because she lost another daughter eight years ago on her birthday. She says she won't be able to take another loss. She spoke about Pt's daughters Marissa Cook, and Marissa Cook; Marissa Cook has cerebral palsy and is not wanting to visit mom(Pt) because she cannot handle it. Marissa Cook has been here to visit Pt with her dad. Marissa Cook requested prayer for a miracle. "God has allowed this for a reason, He will do something" Ch prayed with her. She was grateful for visit. Ch let her know about anytime availability of chaplains for support when needed. She said she will be back again later in the night after going to [employer].

## 2020-02-07 NOTE — Progress Notes (Signed)
Pharmacy Antibiotic Note  Marissa Cook is a 54 y.o. 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 Since admission her renal function has slowly worsened   Pharmacy has been consulted for acyclovir dosing (empiric treatment pending LP).  Plan: adjust acyclovir to 815 mg IV every 12 hours  Height: 5' 2.99" (160 cm) Weight: 88.9 kg (195 lb 15.8 oz) IBW/kg (Calculated) : 52.38  Temp (24hrs), Avg:100.7 F (38.2 C), Min:98.7 F (37.1 C), Max:102 F (38.9 C)  Recent Labs  Lab 02/03/20 0807 02/04/20 0343 02/05/20 0342 02/06/20 0521 02/07/20 0313  WBC 15.9* 20.3* 16.0* 14.7* 20.6*  CREATININE 0.63 0.77 0.81 1.25* 1.46*    Estimated Creatinine Clearance: 48.2 mL/min (A) (by C-G formula based on SCr of 1.46 mg/dL (H)).    Allergies  Allergen Reactions  . Sulfasalazine     UNSPECIFIED REACTION     Antimicrobials this admission: remdesivir 9/29>10/03 acyclovir 10/03 >>  Microbiology results: 10/05: CSF NG<24h 9/28 BCx: NG final 9/29 MRSA PCR (-) 9/30 Sputum: moderate GPC, rare GNR 9/28 SARS CoV-2: positive  Thank you for allowing pharmacy to be a part of this patient's care.  Dallie Piles, PharmD, BCPS Clinical Pharmacist 02/07/2020 7:27 AM

## 2020-02-08 DIAGNOSIS — E1311 Other specified diabetes mellitus with ketoacidosis with coma: Secondary | ICD-10-CM

## 2020-02-08 DIAGNOSIS — U071 COVID-19: Secondary | ICD-10-CM | POA: Diagnosis not present

## 2020-02-08 DIAGNOSIS — J9601 Acute respiratory failure with hypoxia: Secondary | ICD-10-CM | POA: Diagnosis not present

## 2020-02-08 DIAGNOSIS — G9341 Metabolic encephalopathy: Secondary | ICD-10-CM | POA: Diagnosis not present

## 2020-02-08 LAB — CBC WITH DIFFERENTIAL/PLATELET
Abs Immature Granulocytes: 1.1 10*3/uL — ABNORMAL HIGH (ref 0.00–0.07)
Basophils Absolute: 0.1 10*3/uL (ref 0.0–0.1)
Basophils Relative: 0 %
Eosinophils Absolute: 0 10*3/uL (ref 0.0–0.5)
Eosinophils Relative: 0 %
HCT: 35.1 % — ABNORMAL LOW (ref 36.0–46.0)
Hemoglobin: 11 g/dL — ABNORMAL LOW (ref 12.0–15.0)
Immature Granulocytes: 5 %
Lymphocytes Relative: 4 %
Lymphs Abs: 1 10*3/uL (ref 0.7–4.0)
MCH: 31.1 pg (ref 26.0–34.0)
MCHC: 31.3 g/dL (ref 30.0–36.0)
MCV: 99.2 fL (ref 80.0–100.0)
Monocytes Absolute: 1.1 10*3/uL — ABNORMAL HIGH (ref 0.1–1.0)
Monocytes Relative: 4 %
Neutro Abs: 20.9 10*3/uL — ABNORMAL HIGH (ref 1.7–7.7)
Neutrophils Relative %: 87 %
Platelets: 275 10*3/uL (ref 150–400)
RBC: 3.54 MIL/uL — ABNORMAL LOW (ref 3.87–5.11)
RDW: 15.4 % (ref 11.5–15.5)
Smear Review: NORMAL
WBC: 24.1 10*3/uL — ABNORMAL HIGH (ref 4.0–10.5)
nRBC: 0.2 % (ref 0.0–0.2)

## 2020-02-08 LAB — GLUCOSE, CAPILLARY
Glucose-Capillary: 272 mg/dL — ABNORMAL HIGH (ref 70–99)
Glucose-Capillary: 312 mg/dL — ABNORMAL HIGH (ref 70–99)
Glucose-Capillary: 342 mg/dL — ABNORMAL HIGH (ref 70–99)
Glucose-Capillary: 359 mg/dL — ABNORMAL HIGH (ref 70–99)
Glucose-Capillary: 327 mg/dL — ABNORMAL HIGH (ref 70–99)

## 2020-02-08 LAB — BASIC METABOLIC PANEL
Anion gap: 8 (ref 5–15)
BUN: 63 mg/dL — ABNORMAL HIGH (ref 6–20)
CO2: 27 mmol/L (ref 22–32)
Calcium: 8.9 mg/dL (ref 8.9–10.3)
Chloride: 105 mmol/L (ref 98–111)
Creatinine, Ser: 1.62 mg/dL — ABNORMAL HIGH (ref 0.44–1.00)
GFR calc non Af Amer: 36 mL/min — ABNORMAL LOW (ref >60)
Glucose, Bld: 328 mg/dL — ABNORMAL HIGH (ref 70–99)
Potassium: 4.2 mmol/L (ref 3.5–5.1)
Sodium: 140 mmol/L (ref 135–145)

## 2020-02-08 LAB — MAGNESIUM: Magnesium: 2.7 mg/dL — ABNORMAL HIGH (ref 1.7–2.4)

## 2020-02-08 LAB — PHOSPHORUS: Phosphorus: 3.7 mg/dL (ref 2.5–4.6)

## 2020-02-08 LAB — HSV(HERPES SMPLX VRS)ABS-I+II(IGG)-CSF: HSV Type I/II Ab, IgG CSF: 5.68 IV — ABNORMAL HIGH (ref <=0.89)

## 2020-02-08 LAB — TRIGLYCERIDES: Triglycerides: 367 mg/dL — ABNORMAL HIGH (ref <150)

## 2020-02-08 LAB — VARICELLA-ZOSTER BY PCR: Varicella-Zoster, PCR: NEGATIVE

## 2020-02-08 MED ORDER — INSULIN DETEMIR 100 UNIT/ML ~~LOC~~ SOLN
5.0000 [IU] | Freq: Once | SUBCUTANEOUS | Status: AC
  Administered 2020-02-08: 5 [IU] via SUBCUTANEOUS
  Filled 2020-02-08: qty 0.05

## 2020-02-08 MED ORDER — VITAL 1.5 CAL PO LIQD
1000.0000 mL | ORAL | Status: DC
  Administered 2020-02-09: 1000 mL

## 2020-02-08 MED ORDER — IBUPROFEN 400 MG PO TABS
400.0000 mg | ORAL_TABLET | Freq: Four times a day (QID) | ORAL | Status: DC | PRN
  Administered 2020-02-08 – 2020-02-09 (×2): 400 mg via ORAL

## 2020-02-08 MED ORDER — IBUPROFEN 100 MG PO CHEW
100.0000 mg | CHEWABLE_TABLET | Freq: Three times a day (TID) | ORAL | Status: DC | PRN
  Filled 2020-02-08: qty 1

## 2020-02-08 MED ORDER — SODIUM CHLORIDE 0.9 % IV SOLN
0.5000 mg/h | INTRAVENOUS | Status: DC
  Administered 2020-02-08: 2 mg/h via INTRAVENOUS
  Administered 2020-02-09 (×2): 6 mg/h via INTRAVENOUS
  Filled 2020-02-08 (×6): qty 10

## 2020-02-08 MED ORDER — MIDAZOLAM 50MG/50ML (1MG/ML) PREMIX INFUSION: 0.5000 mg/h | INTRAVENOUS | Status: DC

## 2020-02-08 MED ORDER — IBUPROFEN 100 MG/5ML PO SUSP
200.0000 mg | Freq: Three times a day (TID) | ORAL | Status: DC | PRN
  Filled 2020-02-08: qty 10

## 2020-02-08 MED ORDER — INSULIN ASPART 100 UNIT/ML ~~LOC~~ SOLN
5.0000 [IU] | SUBCUTANEOUS | Status: DC
  Administered 2020-02-08 – 2020-02-09 (×7): 5 [IU] via SUBCUTANEOUS
  Filled 2020-02-08 (×7): qty 1

## 2020-02-08 MED ORDER — INSULIN DETEMIR 100 UNIT/ML ~~LOC~~ SOLN
20.0000 [IU] | Freq: Two times a day (BID) | SUBCUTANEOUS | Status: DC
  Administered 2020-02-08 – 2020-02-09 (×2): 20 [IU] via SUBCUTANEOUS
  Filled 2020-02-08 (×4): qty 0.2

## 2020-02-08 MED ORDER — PROSOURCE TF PO LIQD
90.0000 mL | Freq: Two times a day (BID) | ORAL | Status: DC
  Administered 2020-02-08 – 2020-02-16 (×13): 90 mL
  Filled 2020-02-08: qty 90

## 2020-02-08 NOTE — Progress Notes (Signed)
PHARMACY CONSULT NOTE  Pharmacy Consult for Electrolyte Monitoring and Replacement   Recent Labs: Potassium (mmol/L)  Date Value  02/07/2020 4.0  01/17/2012 3.8   Magnesium (mg/dL)  Date Value  02/07/2020 2.5 (H)   Calcium (mg/dL)  Date Value  02/07/2020 8.5 (L)   Calcium, Total (mg/dL)  Date Value  01/17/2012 9.5   Albumin (g/dL)  Date Value  02/04/2020 2.4 (L)   Phosphorus (mg/dL)  Date Value  02/07/2020 2.4 (L)   Sodium (mmol/L)  Date Value  02/07/2020 140  01/17/2012 139   Corrected Ca: 8.5 mg/dL  Assessment: 54 year old female with PMHx of anxiety, depression, DM, hypothyroidism, HLD admitted with COVID-19 PNA, DKA, acute metabolic encephalopathy. She is currently on an insulin infusion  Goal of Therapy:  Electrolytes WNL  Plan:  107:  K @ 2315 = 4.0  Potassium WNL, defer replacement at this time  BMP every 6 hours while on an insulin infusion  phosphorous and magnesium re-check in am  Valari Taylor D ,PharmD Clinical Pharmacist 02/08/2020 1:03 AM

## 2020-02-08 NOTE — Progress Notes (Signed)
Inpatient Diabetes Program Recommendations  AACE/ADA: New Consensus Statement on Inpatient Glycemic Control (2015)  Target Ranges:  Prepandial:   less than 140 mg/dL      Peak postprandial:   less than 180 mg/dL (1-2 hours)      Critically ill patients:  140 - 180 mg/dL    Results for Marissa Cook, Marissa Cook (MRN [id number]) as of 02/08/2020 09:39  Ref. Range 02/07/2020 12:23 02/07/2020 13:45 02/07/2020 15:46 02/07/2020 16:51 02/07/2020 19:05 02/07/2020 23:03 02/08/2020 03:13 02/08/2020 07:56  Glucose-Capillary Latest Ref Range: 70 - 99 mg/dL 158 (H) 151 (H) 138 (H) 170 (H) 161 (H)  11 units NOVOLOG +  15 units LEVEMIR 269 (H)  11 units NOVOLOG  272 (H)  16 units NOVOLOG  312 (H)  15 units NOVOLOG +  15 units LEVEMIR      Home Diabetes meds: Tresiba 45 units q PM Metformin XR 1000 mg bid Novolog 8 units with meals plus Novolog correction Jardiance 25 mg daily   Current Orders: Levemir 15 units BID      Novolog 0-20 units Q4 Hours      Decadron 6 mg Q24H     Transitioned off the Drip yest PM--Levemir given at 5pm--Drip off at 7pm.   Tube Feeds running 50cc/hr   MD- Note CBGs on the rise since 12am.  2nd dose Levemir due to be given this AM since transition off the IV Insulin Drip  Please consider the following:  1. Increase Levemir to 20 units BID  2. Start Novolog Tube Feed Coverage: Novolog 5 units Q4 hours  HOLD if tube feeds HELD for any reason    --Will follow patient during hospitalization--  Wyn Quaker RN, MSN, CDE Diabetes Coordinator Inpatient Glycemic Control Team Team Pager: [phone] (8a-5p)

## 2020-02-08 NOTE — Progress Notes (Signed)
CRITICAL CARE NOTE  54 yo female with a PMH of Acute Pancreatitis, Hypothyroidism, HLD, Type II Diabetes Mellitus, HTN, H Pylori, Depression, Anemia, DDD, and Fatty Liver Disease. She presented to [facility] ER via EMS on 09/28 with altered mental status and c/o "not feeling well." Upon arrival to the ER pt alert to self only. She was prescribed azithromycin and prednisone due to acute respiratory failure by her PCP on 09/26 while awaiting for final COVID-19 results. In the ER lab results ruled pt in for DKA and metabolic acidosis, therefore insulin gtt initiated. COVID-19 positive and CXR concerning for  multifocal pneumonia. CTA Chest negative for PE or acute aortic syndrome, but concerning for multifocal pneumonia. CT Head negative for acute intracranial findings. She was subsequently admitted to the stepdown unit per hospitalist team for additional workup and treatment, but remained in the ER pending bed availability. PCCM team consulted on 09/29 to assist with management of COVID pneumonia and acute metabolic encephalopathy.    54 yo female admitted with acute metabolic encephalopathy, acute metabolic acidosis, DKA, and COVID-19 pneumonia   SIGNIFICANT EVENTS/STUDIES:  09/28: CT Head revealed no acute intracranial findings. Hypoattenuating focus in the right basal ganglia compatible with remote lacunar infarct versus prominent perivascular space as detailed on comparison. 09/28: CTA Chest revealed no pulmonary embolus or acute aortic syndrome. Multifocal, peripheral predominant ground glass opacities, most consistent with multifocal pneumonia, including viral pneumonia. 09/29: Pt admitted to stepdown unit per hospitalist team, however remained in the ER pending bed availability 09/29: PCCM team consulted to assist with management of COVID pneumonia and acute metabolic encephalopathy 7/16: Patient is weaned to 35% on ventilator, weaned off sedation with mentation slowly waking up but not  following verbal communication yet. Will hold off on EEG for now due to SBT and awakening trial. Some concern for possible seizure activity overnight, we are monitoring closely and will perform EEG if any re-emergence of symptoms is noted.  02/01/20- patient is liberated from MV, she still is encephalopathic its unclear if she had central event. I discussed case with neurology and there is plan for repeat EEG.  10/3- patient got re-intubated overnight due to encephalopathy which seems to be chronic recurrent. I have discussed with husband most recent events and care plan 10/4- patient remains critically ill, neurologic evaluation on going 10/5- Worsening hypoxia when patient turned supine from prone, requiring 100% FiO2 and deep sedation/paralysis 10/6-not tolerating proning, looks miserable on the vent,agonal breathing 10/7 severe resp failure, family updated, confrimed DNR status   CC  follow up respiratory failure  SUBJECTIVE Patient remains critically ill Prognosis is guarded   BP 109/63   Pulse (!) 119   Temp (!) 100.9 F (38.3 C)   Resp (!) 34   Ht 5' 2.99" (1.6 m)   Wt 96.9 kg   SpO2 94%   BMI 37.85 kg/m    I/O last 3 completed shifts: In: 5865.8 [I.V.:4645; NG/GT:1170.8; IV Piggyback:50] Out: 9678 [Urine:1665] No intake/output data recorded.  SpO2: 94 % O2 Flow Rate (L/min): 15 L/min FiO2 (%): 90 %  Estimated body mass index is 37.85 kg/m as calculated from the following:   Height as of this encounter: 5' 2.99" (1.6 m).   Weight as of this encounter: 96.9 kg.  Vent Mode: PRVC FiO2 (%):  [65 %-100 %] 90 % Set Rate:  [34 bmp] 34 bmp Vt Set:  [500 mL] 500 mL PEEP:  [12 cmH20-15 cmH20] 12 cmH20 Plateau Pressure:  [32 cmH20] 32 cmH20  CBC  Component Value Date/Time   WBC 24.1 (H) 02/08/2020 0324   RBC 3.54 (L) 02/08/2020 0324   HGB 11.0 (L) 02/08/2020 0324   HCT 35.1 (L) 02/08/2020 0324   PLT 275 02/08/2020 0324   MCV 99.2 02/08/2020 0324   MCH 31.1  02/08/2020 0324   MCHC 31.3 02/08/2020 0324   RDW 15.4 02/08/2020 0324   LYMPHSABS 1.0 02/08/2020 0324   MONOABS 1.1 (H) 02/08/2020 0324   EOSABS 0.0 02/08/2020 0324   BASOSABS 0.1 02/08/2020 0324   BMP Latest Ref Rng & Units 02/08/2020 02/07/2020 02/07/2020  Glucose 70 - 99 mg/dL 328(H) 274(H) 190(H)  BUN 6 - 20 mg/dL 63(H) 60(H) 56(H)  Creatinine 0.44 - 1.00 mg/dL 1.62(H) 1.65(H) 1.73(H)  Sodium 135 - 145 mmol/L 140 140 141  Potassium 3.5 - 5.1 mmol/L 4.2 4.0 4.0  Chloride 98 - 111 mmol/L 105 105 105  CO2 22 - 32 mmol/L 27 26 25   Calcium 8.9 - 10.3 mg/dL 8.9 8.5(L) 8.6(L)      REVIEW OF SYSTEMS  PATIENT IS UNABLE TO PROVIDE COMPLETE REVIEW OF SYSTEMS DUE TO SEVERE CRITICAL ILLNESS      COVID-19 DISASTER DECLARATION:   FULL CONTACT PHYSICAL EXAMINATION WAS NOT POSSIBLE DUE TO TREATMENT OF COVID-19  AND CONSERVATION OF PERSONAL PROTECTIVE EQUIPMENT, LIMITED EXAM FINDINGS INCLUDE-   PHYSICAL EXAMINATION:  GENERAL:critically ill appearing, +resp distress NEUROLOGIC: obtunded, GCS<8   Patient assessed or the symptoms described in the history of present illness.  In the context of the Global COVID-19 pandemic, which necessitated consideration that the patient might be at risk for infection with the SARS-CoV-2 virus that causes COVID-19, Institutional protocols and algorithms that pertain to the evaluation of patients at risk for COVID-19 are in a state of rapid change based on information released by regulatory bodies including the CDC and federal and state organizations. These policies and algorithms were followed during the patient's care while in hospital.    MEDICATIONS: I have reviewed all medications and confirmed regimen as documented   CULTURE RESULTS   Recent Results (from the past 240 hour(s))  CULTURE, BLOOD (ROUTINE X 2) w Reflex to ID Panel     Status: None   Collection Time: 01/13/2020  8:49 PM   Specimen: BLOOD  Result Value Ref Range Status   Specimen  Description BLOOD BLOOD LEFT ARM  Final   Special Requests   Final    BOTTLES DRAWN AEROBIC AND ANAEROBIC Blood Culture adequate volume   Culture   Final    NO GROWTH 5 DAYS Performed at [facility], [address] [place], [place] [address]    Report Status 02/04/2020 FINAL  Final  Respiratory Panel by RT PCR (Flu A&B, Covid) - Nasopharyngeal Swab     Status: Abnormal   Collection Time: 01/03/2020  8:51 PM   Specimen: Nasopharyngeal Swab  Result Value Ref Range Status   SARS Coronavirus 2 by RT PCR POSITIVE (A) NEGATIVE Final    Comment: RESULT CALLED TO, READ BACK BY AND VERIFIED WITH: [place] @ 2337 01/28/2020 RH (NOTE) SARS-CoV-2 target nucleic acids are DETECTED.  SARS-CoV-2 RNA is generally detectable in upper respiratory specimens  during the acute phase of infection. Positive results are indicative of the presence of the identified virus, but do not rule out bacterial infection or co-infection with other pathogens not detected by the test. Clinical correlation with patient history and other diagnostic information is necessary to determine patient infection status. The expected result is Negative.  Fact  Sheet for Patients:  PinkCheek.be  Fact Sheet for Healthcare Providers: GravelBags.it  This test is not yet approved or cleared by the [place] FDA and  has been authorized for detection and/or diagnosis of SARS-CoV-2 by FDA under an Emergency Use Authorization (EUA).  This EUA will remain in effect (meaning this test can be u sed) for the duration of  the COVID-19 declaration under Section 564(b)(1) of the Act, 21 U.S.C. section 360bbb-3(b)(1), unless the authorization is terminated or revoked sooner.      Influenza A by PCR NEGATIVE NEGATIVE Final   Influenza B by PCR NEGATIVE NEGATIVE Final    Comment: (NOTE) The Xpert Xpress SARS-CoV-2/FLU/RSV assay is intended as an aid in  the  diagnosis of influenza from Nasopharyngeal swab specimens and  should not be used as a sole basis for treatment. Nasal washings and  aspirates are unacceptable for Xpert Xpress SARS-CoV-2/FLU/RSV  testing.  Fact Sheet for Patients: PinkCheek.be  Fact Sheet for Healthcare Providers: GravelBags.it  This test is not yet approved or cleared by the [place] FDA and  has been authorized for detection and/or diagnosis of SARS-CoV-2 by  FDA under an Emergency Use Authorization (EUA). This EUA will remain  in effect (meaning this test can be used) for the duration of the  Covid-19 declaration under Section 564(b)(1) of the Act, 21  U.S.C. section 360bbb-3(b)(1), unless the authorization is  terminated or revoked. Performed at [facility], [address] [place], [place] [address]   CULTURE, BLOOD (ROUTINE X 2) w Reflex to ID Panel     Status: None   Collection Time: 01/25/2020  8:51 PM   Specimen: BLOOD  Result Value Ref Range Status   Specimen Description BLOOD RIGHT ANTECUBITAL  Final   Special Requests   Final    BOTTLES DRAWN AEROBIC AND ANAEROBIC Blood Culture adequate volume   Culture   Final    NO GROWTH 5 DAYS Performed at [facility], [address] [place], [place] [address]    Report Status 02/04/2020 FINAL  Final  MRSA PCR Screening     Status: None   Collection Time: 01/30/20  5:49 PM   Specimen: Nasopharyngeal  Result Value Ref Range Status   MRSA by PCR NEGATIVE NEGATIVE Final    Comment:        The GeneXpert MRSA Assay (FDA approved for NASAL specimens only), is one component of a comprehensive MRSA colonization surveillance program. It is not intended to diagnose MRSA infection nor to guide or monitor treatment for MRSA infections. Performed at [facility], [address] [place], [place] [address]   Culture, respiratory     Status: None   Collection Time:  01/31/20  8:31 AM  Result Value Ref Range Status   Specimen Description   Final    EXPECTORATED SPUTUM Performed at [facility], [address], [place], [place] [address]    Special Requests   Final    NONE Performed at [facility], [address] [place], [place] [address]    Gram Stain   Final    FEW WBC PRESENT, PREDOMINANTLY PMN MODERATE GRAM POSITIVE COCCI RARE GRAM NEGATIVE RODS    Culture   Final    FEW Consistent with normal respiratory flora. Performed at [facility], [place] [address] [place], [place] [address]    Report Status 02/02/2020 FINAL  Final  CSF culture     Status: None (Preliminary result)   Collection Time:  02/05/20  2:19 PM   Specimen: PATH Cytology CSF; Cerebrospinal Fluid  Result Value Ref Range Status   Specimen Description   Final    CSF Performed at [facility], [address], [place], [place] [address]    Special Requests   Final    NONE Performed at [facility], [address] [place], [place] [address]    Gram Stain   Final    CYTOSPIN SLIDE WBC PRESENT, PREDOMINANTLY MONONUCLEAR NO ORGANISMS SEEN RED BLOOD CELLS PRESENT Performed at [facility], [address], [place], [place] [address]    Culture   Final    NO GROWTH 2 DAYS Performed at [facility], [place] [address] [place], [place] [address]    Report Status PENDING  Incomplete         Nutrition Status: Nutrition Problem: Inadequate oral intake Etiology: inability to eat Signs/Symptoms: NPO status Interventions: Tube feeding, Prostat, MVI     Indwelling Urinary Catheter continued, requirement due to   Reason to continue Indwelling Urinary Catheter strict Intake/Output monitoring for hemodynamic instability   Central Line/ continued, requirement due to  Reason to continue [place] of central venous pressure or other hemodynamic parameters and poor IV access   Ventilator continued,  requirement due to severe respiratory failure   Ventilator Sedation RASS 0 to -2      ASSESSMENT AND PLAN SYNOPSIS  Acute hypoxemic respiratory failure due to COVID-19 pneumonia / ARDS Mechanical ventilation via ARDS protocol, target PRVC 6 cc/kg Wean PEEP and FiO2 as able Goal plateau pressure less than 30, driving pressure less than 15 Paralytics if necessary for vent synchrony, gas exchange Cycle prone positioning if necessary for oxygenation Deep sedation per PAD protocol, goal RASS -4, currently fentanyl, midazolam Diuresis as blood pressure and renal function can tolerate, goal CVP 5-8.   diuresis as tolerated based on Kidney function VAP prevention order set IV STEROIDS    Severe ACUTE Hypoxic and Hypercapnic Respiratory Failure -continue Full MV support -continue Bronchodilator Therapy -Wean Fio2 and PEEP as tolerated -VAP/VENT bundle implementation   ACUTE KIDNEY INJURY/Renal Failure -continue Foley Catheter-assess need -Avoid nephrotoxic agents -Follow urine output, BMP -Ensure adequate renal perfusion, optimize oxygenation -Renal dose medications     NEUROLOGY Acute toxic metabolic encephalopathy, need for sedation Goal RASS -2 to -3  SHOCK-SEPSIS due to COVID pneumonia -use vasopressors to keep MAP>65 -follow ABG and LA -follow up cultures -emperic ABX -consider stress dose steroids  CARDIAC ICU monitoring  ID -continue IV abx as prescibed -follow up cultures  GI GI PROPHYLAXIS as indicated   DIET-->TF's as tolerated Constipation protocol as indicated  ENDO - will use ICU hypoglycemic\Hyperglycemia protocol if indicated     ELECTROLYTES -follow labs as needed -replace as needed -pharmacy consultation and following   DVT/GI PRX ordered and assessed TRANSFUSIONS AS NEEDED MONITOR FSBS I Assessed the need for Labs I Assessed the need for Foley I Assessed the need for Central Venous Line Family Discussion when available I  Assessed the need for Mobilization I made an Assessment of medications to be adjusted accordingly Safety Risk assessment completed   CASE DISCUSSED IN MULTIDISCIPLINARY ROUNDS WITH ICU TEAM  Critical Care Time devoted to patient care services described in this note is 42 minutes.   Overall, patient is critically ill, prognosis is guarded.  Patient with Multiorgan failure and at high risk for cardiac arrest and death.    Corrin Parker, M.D.  [facility]  Medical Director [place] Director [facility] Cardio-Pulmonary Department

## 2020-02-08 NOTE — Progress Notes (Signed)
Pharmacy Antibiotic Note  Marissa Cook is a 54 y.o. 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 Since admission her renal function worsened but at this point appears to be stable   Pharmacy was consulted for acyclovir dosing (empiric treatment pending LP results).  Plan: continue acyclovir to 815 mg IV every 12 hours  Continue to monitor renal function and adjust dose as needed  Height: 5' 2.99" (160 cm) Weight: 96.9 kg (213 lb 10 oz) IBW/kg (Calculated) : 52.38  Temp (24hrs), Avg:101.3 F (38.5 C), Min:100.4 F (38 C), Max:102 F (38.9 C)  Recent Labs  Lab 02/04/20 0343 02/04/20 0343 02/05/20 0342 02/05/20 0342 02/06/20 0521 02/07/20 0313 02/07/20 1818 02/07/20 2315 02/08/20 0324 02/08/20 0519  WBC 20.3*  --  16.0*  --  14.7* 20.6*  --   --  24.1*  --   CREATININE 0.77   < > 0.81   < > 1.25* 1.46* 1.73* 1.65*  --  1.62*   < > = values in this interval not displayed.    Estimated Creatinine Clearance: 45 mL/min (A) (by C-G formula based on SCr of 1.62 mg/dL (H)).    Allergies  Allergen Reactions  . Sulfasalazine     UNSPECIFIED REACTION     Antimicrobials this admission: remdesivir 9/29>10/03 acyclovir 10/03 >>  Microbiology results: 10/05: CSF NG x 3 days (pending) 9/28 BCx: NG final 9/29 MRSA PCR (-) 9/30 Sputum: normal flora 9/28 SARS CoV-2: positive  Thank you for allowing pharmacy to be a part of this patient's care.  Dallie Piles, PharmD, BCPS Clinical Pharmacist 02/08/2020 7:27 AM

## 2020-02-08 NOTE — Progress Notes (Signed)
Silver colored ring with multiple clear stones that was located on patient's right ring finger was given to Sharlee Blew (daughter) to take home. Additionally, a silver colored ring with a clear stone and a silver band that was located on patient's left ring finger was removed due to increased swelling around patient's fingers and knuckles. These were also given to Sharlee Blew to take home.  Cameron Ali, RN

## 2020-02-08 NOTE — Progress Notes (Signed)
Nutrition Follow-up  DOCUMENTATION CODES:   Obesity unspecified  INTERVENTION:  Initiate Vital 1.5 Cal at 55 mL/hr (1320 mL goal daily volume) + PROSource 90 mL BID per tube. Provides 2140 kcal, 133 grams of protein, 1003 mL H2O daily.  NUTRITION DIAGNOSIS:   Inadequate oral intake related to inability to eat as evidenced by NPO status.  Ongoing.  GOAL:   Patient will meet greater than or equal to 90% of their needs  Met with TF regimen.  MONITOR:   Vent status, Labs, Weight trends, TF tolerance, Skin, I & O's  REASON FOR ASSESSMENT:   Ventilator    ASSESSMENT:   54 year old female with PMHx of anxiety, depression, DM, hypothyroidism, HLD admitted with COVID-19 PNA, DKA, acute metabolic encephalopathy.  9/29 transferred to ICU from ED and intubated 10/1 extubated 10/2 reintubated  Patient is currently intubated on ventilator support MV: 12.6 L/min Temp (24hrs), Avg:101.3 F (38.5 C), Min:100.4 F (38 C), Max:101.8 F (38.8 C)  Medications reviewed and include: vitamin C 500 mg daily, vitamin D3 1000 units daily, Colace 100 mg BID, Novolog 0-20 units Q4hrs, Novolog 5 units Q4hrs, Levemir 20 units BID, levothyroxine, Miralax, thiamine 100 mg daily, zinc sulfate 220 mg daily, acyclovir, famotidine, fentanyl gtt, LR at 50 mL/hr, Versed gtt, phenylephrine gtt at 110 mcg/min. Propofol now discontinued.  Labs reviewed: CBG 272-342, BUN 63, Creatinine 1.62, Magnesium 2.7.  I/O: 1330 mL UOP yesterday (0.6 mL/kg/hr)  Weight trend: 96.9 kg on 10/8; +17.5 kg from 9/29  Enteral Access: OGT placed 10/2; terminates in proximal stomach per abdominal x-ray 10/2  TF regimen: Vital High Protein at 50 mL/hr + PROSource TF 45 mL BID  Discussed with RN and on rounds.  Diet Order:   Diet Order            Diet NPO time specified  Diet effective now                EDUCATION NEEDS:   No education needs have been identified at this time  Skin:  Skin Assessment: Reviewed  RN Assessment  Last BM:  Unknown  Height:   Ht Readings from Last 1 Encounters:  02/02/20 5' 2.99" (1.6 m)   Weight:   Wt Readings from Last 1 Encounters:  02/08/20 96.9 kg   Ideal Body Weight:  52.3 kg  BMI:  Body mass index is 37.85 kg/m.  Estimated Nutritional Needs:   Kcal:  2052  Protein:  120-130 grams  Fluid:  >/= 2.3 L/day  Jacklynn Barnacle, MS, RD, LDN Pager number available on Amion

## 2020-02-08 NOTE — Progress Notes (Signed)
PHARMACY CONSULT NOTE  Pharmacy Consult for Electrolyte Monitoring and Replacement   Recent Labs: Potassium (mmol/L)  Date Value  02/08/2020 4.2  01/17/2012 3.8   Magnesium (mg/dL)  Date Value  02/08/2020 2.7 (H)   Calcium (mg/dL)  Date Value  02/08/2020 8.9   Calcium, Total (mg/dL)  Date Value  01/17/2012 9.5   Albumin (g/dL)  Date Value  02/04/2020 2.4 (L)   Phosphorus (mg/dL)  Date Value  02/08/2020 3.7   Sodium (mmol/L)  Date Value  02/08/2020 140  01/17/2012 139   Corrected Ca: 10.2 mg/dL  Assessment: 54 year old female with PMHx of anxiety, depression, DM, hypothyroidism, HLD admitted with COVID-19 PNA, DKA, acute metabolic encephalopathy. She has been on an insulin infusion but was transitioned to subcutaneous insulin yesterday evening.  Goal of Therapy:  Electrolytes WNL  Plan:   No electrolyte replacement required at this time  re-check electrolytes in am  Dallie Piles ,PharmD Clinical Pharmacist 02/08/2020 7:18 AM

## 2020-02-09 DIAGNOSIS — E0811 Diabetes mellitus due to underlying condition with ketoacidosis with coma: Secondary | ICD-10-CM | POA: Diagnosis not present

## 2020-02-09 DIAGNOSIS — U071 COVID-19: Secondary | ICD-10-CM | POA: Diagnosis not present

## 2020-02-09 DIAGNOSIS — G9341 Metabolic encephalopathy: Secondary | ICD-10-CM | POA: Diagnosis not present

## 2020-02-09 DIAGNOSIS — J9601 Acute respiratory failure with hypoxia: Secondary | ICD-10-CM | POA: Diagnosis not present

## 2020-02-09 LAB — CBC WITH DIFFERENTIAL/PLATELET
Abs Immature Granulocytes: 0.95 10*3/uL — ABNORMAL HIGH (ref 0.00–0.07)
Basophils Absolute: 0.1 10*3/uL (ref 0.0–0.1)
Basophils Relative: 0 %
Eosinophils Absolute: 0 10*3/uL (ref 0.0–0.5)
Eosinophils Relative: 0 %
HCT: 34.7 % — ABNORMAL LOW (ref 36.0–46.0)
Hemoglobin: 10.7 g/dL — ABNORMAL LOW (ref 12.0–15.0)
Immature Granulocytes: 4 %
Lymphocytes Relative: 3 %
Lymphs Abs: 0.8 10*3/uL (ref 0.7–4.0)
MCH: 31.1 pg (ref 26.0–34.0)
MCHC: 30.8 g/dL (ref 30.0–36.0)
MCV: 100.9 fL — ABNORMAL HIGH (ref 80.0–100.0)
Monocytes Absolute: 1.3 10*3/uL — ABNORMAL HIGH (ref 0.1–1.0)
Monocytes Relative: 5 %
Neutro Abs: 21.6 10*3/uL — ABNORMAL HIGH (ref 1.7–7.7)
Neutrophils Relative %: 88 %
Platelets: 284 10*3/uL (ref 150–400)
RBC: 3.44 MIL/uL — ABNORMAL LOW (ref 3.87–5.11)
RDW: 14.7 % (ref 11.5–15.5)
Smear Review: NORMAL
WBC: 24.8 10*3/uL — ABNORMAL HIGH (ref 4.0–10.5)
nRBC: 0.4 % — ABNORMAL HIGH (ref 0.0–0.2)

## 2020-02-09 LAB — BASIC METABOLIC PANEL
Anion gap: 6 (ref 5–15)
BUN: 89 mg/dL — ABNORMAL HIGH (ref 6–20)
CO2: 28 mmol/L (ref 22–32)
Calcium: 9.7 mg/dL (ref 8.9–10.3)
Chloride: 103 mmol/L (ref 98–111)
Creatinine, Ser: 2.14 mg/dL — ABNORMAL HIGH (ref 0.44–1.00)
GFR, Estimated: 26 mL/min — ABNORMAL LOW (ref >60)
Glucose, Bld: 298 mg/dL — ABNORMAL HIGH (ref 70–99)
Potassium: 4.6 mmol/L (ref 3.5–5.1)
Sodium: 137 mmol/L (ref 135–145)

## 2020-02-09 LAB — GLUCOSE, CAPILLARY
Glucose-Capillary: 217 mg/dL — ABNORMAL HIGH (ref 70–99)
Glucose-Capillary: 225 mg/dL — ABNORMAL HIGH (ref 70–99)
Glucose-Capillary: 236 mg/dL — ABNORMAL HIGH (ref 70–99)
Glucose-Capillary: 257 mg/dL — ABNORMAL HIGH (ref 70–99)
Glucose-Capillary: 297 mg/dL — ABNORMAL HIGH (ref 70–99)
Glucose-Capillary: 326 mg/dL — ABNORMAL HIGH (ref 70–99)
Glucose-Capillary: 329 mg/dL — ABNORMAL HIGH (ref 70–99)

## 2020-02-09 LAB — MAGNESIUM: Magnesium: 3.1 mg/dL — ABNORMAL HIGH (ref 1.7–2.4)

## 2020-02-09 LAB — CSF CULTURE W GRAM STAIN: Culture: NO GROWTH

## 2020-02-09 LAB — PHOSPHORUS: Phosphorus: 4.9 mg/dL — ABNORMAL HIGH (ref 2.5–4.6)

## 2020-02-09 MED ORDER — IBUPROFEN 100 MG/5ML PO SUSP
400.0000 mg | Freq: Four times a day (QID) | ORAL | Status: DC | PRN
  Filled 2020-02-09: qty 20

## 2020-02-09 MED ORDER — INSULIN ASPART 100 UNIT/ML ~~LOC~~ SOLN
10.0000 [IU] | SUBCUTANEOUS | Status: DC
  Administered 2020-02-09 – 2020-02-10 (×5): 10 [IU] via SUBCUTANEOUS
  Filled 2020-02-09 (×4): qty 1

## 2020-02-09 MED ORDER — MIDAZOLAM HCL 50 MG/10ML IJ SOLN
0.5000 mg/h | INTRAVENOUS | Status: DC
  Administered 2020-02-09: 8 mg/h via INTRAVENOUS
  Administered 2020-02-10 – 2020-02-14 (×5): 2 mg/h via INTRAVENOUS
  Administered 2020-02-14: 7 mg/h via INTRAVENOUS
  Administered 2020-02-15 – 2020-02-16 (×3): 5 mg/h via INTRAVENOUS
  Filled 2020-02-09 (×11): qty 10

## 2020-02-09 MED ORDER — INSULIN DETEMIR 100 UNIT/ML ~~LOC~~ SOLN
25.0000 [IU] | Freq: Two times a day (BID) | SUBCUTANEOUS | Status: DC
  Administered 2020-02-09 – 2020-02-11 (×3): 25 [IU] via SUBCUTANEOUS
  Filled 2020-02-09 (×5): qty 0.25

## 2020-02-09 MED ORDER — VASOPRESSIN 20 UNITS/100 ML INFUSION FOR SHOCK
0.0000 [IU]/min | INTRAVENOUS | Status: DC
  Administered 2020-02-09 (×2): 0.03 [IU]/min via INTRAVENOUS
  Administered 2020-02-10 – 2020-02-15 (×14): 0.04 [IU]/min via INTRAVENOUS
  Administered 2020-02-16: 0.03 [IU]/min via INTRAVENOUS
  Filled 2020-02-09 (×20): qty 100

## 2020-02-09 MED ORDER — PHENYLEPHRINE CONCENTRATED 100MG/250ML (0.4 MG/ML) INFUSION SIMPLE
0.0000 ug/min | INTRAVENOUS | Status: DC
  Administered 2020-02-09: 180 ug/min via INTRAVENOUS
  Administered 2020-02-10 (×2): 400 ug/min via INTRAVENOUS
  Administered 2020-02-10: 275 ug/min via INTRAVENOUS
  Administered 2020-02-10 – 2020-02-11 (×3): 400 ug/min via INTRAVENOUS
  Administered 2020-02-11: 275.333 ug/min via INTRAVENOUS
  Administered 2020-02-11: 325 ug/min via INTRAVENOUS
  Administered 2020-02-12 – 2020-02-13 (×11): 400 ug/min via INTRAVENOUS
  Administered 2020-02-14: 300 ug/min via INTRAVENOUS
  Administered 2020-02-14: 380 ug/min via INTRAVENOUS
  Administered 2020-02-14: 350 ug/min via INTRAVENOUS
  Administered 2020-02-14: 300 ug/min via INTRAVENOUS
  Administered 2020-02-14: 400 ug/min via INTRAVENOUS
  Administered 2020-02-15: 130 ug/min via INTRAVENOUS
  Administered 2020-02-15: 225 ug/min via INTRAVENOUS
  Administered 2020-02-15: 280 ug/min via INTRAVENOUS
  Filled 2020-02-09 (×30): qty 250

## 2020-02-09 MED ORDER — BISACODYL 10 MG RE SUPP
10.0000 mg | Freq: Every day | RECTAL | Status: DC | PRN
  Administered 2020-02-09: 10 mg via RECTAL
  Filled 2020-02-09: qty 1

## 2020-02-09 MED ORDER — NOREPINEPHRINE 4 MG/250ML-% IV SOLN: 0.0000 ug/min | INTRAVENOUS | Status: DC

## 2020-02-09 NOTE — Progress Notes (Signed)
Pressure could not be maintained on Neo at 90 mcg, so maxed out Neo and Hinton Dyer, NP added Vasopressin, Neo is now back down to 376mcg, while Vasopressin is at 0.03 units

## 2020-02-09 NOTE — Progress Notes (Signed)
Increased FIO2 to 90 % because patient could not maintain o2 sat above 88%. Now o2 sat is 90%

## 2020-02-09 NOTE — Progress Notes (Signed)
CRITICAL CARE NOTE 54 yo female with a PMH of Acute Pancreatitis, Hypothyroidism, HLD, Type II Diabetes Mellitus, HTN, H Pylori, Depression, Anemia, DDD, and Fatty Liver Disease. She presented to [facility] ER via EMS on 09/28 with altered mental status and c/o "not feeling well." Upon arrival to the ER pt alert to self only. She was prescribed azithromycin and prednisone due to acute respiratory failure by her PCP on 09/26 while awaiting for final COVID-19 results. In the ER lab results ruled pt in for DKA and metabolic acidosis, therefore insulin gtt initiated. COVID-19 positive and CXR concerning for  multifocal pneumonia. CTA Chest negative for PE or acute aortic syndrome, but concerning for multifocal pneumonia. CT Head negative for acute intracranial findings. She was subsequently admitted to the stepdown unit per hospitalist team for additional workup and treatment, but remained in the ER pending bed availability. PCCM team consulted on 09/29 to assist with management of COVID pneumonia and acute metabolic encephalopathy.    53 yo female admitted with acute metabolic encephalopathy, acute metabolic acidosis, DKA, and COVID-19 pneumonia   SIGNIFICANT EVENTS/STUDIES:  09/28: CT Head revealed no acute intracranial findings. Hypoattenuating focus in the right basal ganglia compatible with remote lacunar infarct versus prominent perivascular space as detailed on comparison. 09/28: CTA Chest revealed no pulmonary embolus or acute aortic syndrome. Multifocal, peripheral predominant ground glass opacities, most consistent with multifocal pneumonia, including viral pneumonia. 09/29: Pt admitted to stepdown unit per hospitalist team, however remained in the ER pending bed availability 09/29: PCCM team consulted to assist with management of COVID pneumonia and acute metabolic encephalopathy 3/01: Patient is weaned to 35% on ventilator, weaned off sedation with mentation slowly waking up but not  following verbal communication yet. Will hold off on EEG for now due to SBT and awakening trial. Some concern for possible seizure activity overnight, we are monitoring closely and will perform EEG if any re-emergence of symptoms is noted.  02/01/20- patient is liberated from MV, she still is encephalopathic its unclear if she had central event. I discussed case with neurology and there is plan for repeat EEG.  10/3- patient got re-intubated overnight due to encephalopathy which seems to be chronic recurrent. I have discussed with husband most recent events and care plan 10/4- patient remains critically ill, neurologic evaluation on going 10/5- Worsening hypoxia when patient turned supine from prone, requiring 100% FiO2 and deep sedation/paralysis 10/6-not tolerating proning, looks miserable on the vent,agonal breathing 10/7 severe resp failure, family updated, confrimed DNR status 10/9 severe ARDS   CC  follow up respiratory failure  SUBJECTIVE Patient remains critically ill Prognosis is guarded Severe ARDS, severe hypoxia  BP 135/77   Pulse (!) 114   Temp 98.8 F (37.1 C)   Resp (!) 34   Ht 5' 2.99" (1.6 m)   Wt 96.9 kg   SpO2 92%   BMI 37.85 kg/m    I/O last 3 completed shifts: In: 3805.8 [I.V.:2594.3; NG/GT:1211.5] Out: 1200 [Urine:1200] Total I/O In: -  Out: 500 [Urine:500]  SpO2: 92 % O2 Flow Rate (L/min): 15 L/min FiO2 (%): 90 %  Estimated body mass index is 37.85 kg/m as calculated from the following:   Height as of this encounter: 5' 2.99" (1.6 m).   Weight as of this encounter: 96.9 kg.  SIGNIFICANT EVENTS   REVIEW OF SYSTEMS  PATIENT IS UNABLE TO PROVIDE COMPLETE REVIEW OF SYSTEMS DUE TO SEVERE CRITICAL ILLNESS      COVID-19 DISASTER DECLARATION:   FULL CONTACT PHYSICAL  EXAMINATION WAS NOT POSSIBLE DUE TO TREATMENT OF COVID-19  AND CONSERVATION OF PERSONAL PROTECTIVE EQUIPMENT, LIMITED EXAM FINDINGS INCLUDE-   PHYSICAL  EXAMINATION:  GENERAL:critically ill appearing, +resp distress NEUROLOGIC: obtunded, GCS<8   Patient assessed or the symptoms described in the history of present illness.  In the context of the Global COVID-19 pandemic, which necessitated consideration that the patient might be at risk for infection with the SARS-CoV-2 virus that causes COVID-19, Institutional protocols and algorithms that pertain to the evaluation of patients at risk for COVID-19 are in a state of rapid change based on information released by regulatory bodies including the CDC and federal and state organizations. These policies and algorithms were followed during the patient's care while in hospital.    MEDICATIONS: I have reviewed all medications and confirmed regimen as documented   CULTURE RESULTS   Recent Results (from the past 240 hour(s))  MRSA PCR Screening     Status: None   Collection Time: 01/30/20  5:49 PM   Specimen: Nasopharyngeal  Result Value Ref Range Status   MRSA by PCR NEGATIVE NEGATIVE Final    Comment:        The GeneXpert MRSA Assay (FDA approved for NASAL specimens only), is one component of a comprehensive MRSA colonization surveillance program. It is not intended to diagnose MRSA infection nor to guide or monitor treatment for MRSA infections. Performed at [facility], [address] [place], [place] [address]   Culture, respiratory     Status: None   Collection Time: 01/31/20  8:31 AM  Result Value Ref Range Status   Specimen Description   Final    EXPECTORATED SPUTUM Performed at [facility], [address] [place], [place] [address]    Special Requests   Final    NONE Performed at [facility], [address] [place], [place] [address]    Gram Stain   Final    FEW WBC PRESENT, PREDOMINANTLY PMN MODERATE GRAM POSITIVE COCCI RARE GRAM NEGATIVE RODS    Culture   Final    FEW Consistent with normal respiratory flora. Performed at [facility], [place] [address] [place], [place] [address]    Report Status 02/02/2020 FINAL  Final  CSF culture     Status: None (Preliminary result)   Collection Time: 02/05/20  2:19 PM   Specimen: PATH Cytology CSF; Cerebrospinal Fluid  Result Value Ref Range Status   Specimen Description   Final    CSF Performed at [facility], [address], [place], [place] [address]    Special Requests   Final    NONE Performed at [facility], [address] [place], [place] [address]    Gram Stain   Final    CYTOSPIN SLIDE WBC PRESENT, PREDOMINANTLY MONONUCLEAR NO ORGANISMS SEEN RED BLOOD CELLS PRESENT Performed at [facility], [address], [place], [place] [address]    Culture   Final    NO GROWTH 3 DAYS Performed at [facility], [place] [address], [place],  [address]    Report Status PENDING  Incomplete          IMAGING    No results found.   Nutrition Status: Nutrition Problem: Inadequate oral intake Etiology: inability to eat Signs/Symptoms: NPO status Interventions: Tube feeding, Prostat, MVI  Vent Mode: PRVC FiO2 (%):  [75 %-90 %] 90 % Set Rate:  [34 bmp] 34 bmp Vt Set:  [500 mL] 500 mL PEEP:  [  12 cmH20] 12 cmH20  CBC    Component Value Date/Time   WBC 24.8 (H) 02/09/2020 0613   RBC 3.44 (L) 02/09/2020 0613   HGB 10.7 (L) 02/09/2020 0613   HCT 34.7 (L) 02/09/2020 0613   PLT 284 02/09/2020 0613   MCV 100.9 (H) 02/09/2020 0613   MCH 31.1 02/09/2020 0613   MCHC 30.8 02/09/2020 0613   RDW 14.7 02/09/2020 0613   LYMPHSABS 0.8 02/09/2020 0613   MONOABS 1.3 (H) 02/09/2020 0613   EOSABS 0.0 02/09/2020 0613   BASOSABS 0.1 02/09/2020 0613   BMP Latest Ref Rng & Units 02/09/2020 02/08/2020 02/07/2020  Glucose 70 - 99 mg/dL 298(H) 328(H) 274(H)  BUN 6 - 20 mg/dL 89(H) 63(H) 60(H)  Creatinine 0.44 - 1.00 mg/dL 2.14(H) 1.62(H) 1.65(H)  Sodium 135 - 145 mmol/L 137 140 140  Potassium 3.5 - 5.1 mmol/L 4.6 4.2 4.0   Chloride 98 - 111 mmol/L 103 105 105  CO2 22 - 32 mmol/L 28 27 26   Calcium 8.9 - 10.3 mg/dL 9.7 8.9 8.5(L)      Indwelling Urinary Catheter continued, requirement due to   Reason to continue Indwelling Urinary Catheter strict Intake/Output monitoring for hemodynamic instability   Central Line/ continued, requirement due to  Reason to continue [employer] of central venous pressure or other hemodynamic parameters and poor IV access   Ventilator continued, requirement due to severe respiratory failure   Ventilator Sedation RASS 0 to -2      ASSESSMENT AND PLAN SYNOPSIS  Acute hypoxemic respiratory failure due to COVID-19 pneumonia / ARDS Mechanical ventilation via ARDS protocol, target PRVC 6 cc/kg Wean PEEP and FiO2 as able Goal plateau pressure less than 30, driving pressure less than 15 Paralytics if necessary for vent synchrony, gas exchange Cycle prone positioning if necessary for oxygenation Deep sedation per PAD protocol, goal RASS -4, currently fentanyl, midazolam Diuresis as blood pressure and renal function can tolerate, goal CVP 5-8.   diuresis as tolerated based on Kidney function VAP prevention order set IV STEROIDS  Unable to prone due to high chance of cardiac arrest   Severe ACUTE Hypoxic and Hypercapnic Respiratory Failure -continue Full MV support -continue Bronchodilator Therapy -Wean Fio2 and PEEP as tolerated -VAP/VENT bundle implementation  ACUTE KIDNEY INJURY/Renal Failure -continue Foley Catheter-assess need -Avoid nephrotoxic agents -Follow urine output, BMP -Ensure adequate renal perfusion, optimize oxygenation -Renal dose medications    NEUROLOGY Acute toxic metabolic encephalopathy, need for sedation Goal RASS -2 to -3 CARDIAC ICU monitoring  ID -continue IV abx as prescibed -follow up cultures  GI GI PROPHYLAXIS as indicated   DIET-->TF's as tolerated Constipation protocol as indicated  ENDO - will use ICU  hypoglycemic\Hyperglycemia protocol if indicated     ELECTROLYTES -follow labs as needed -replace as needed -pharmacy consultation and following   DVT/GI PRX ordered and assessed TRANSFUSIONS AS NEEDED MONITOR FSBS I Assessed the need for Labs I Assessed the need for Foley I Assessed the need for Central Venous Line Family Discussion when available I made an Assessment of medications to be adjusted accordingly Safety Risk assessment completed   CASE DISCUSSED IN MULTIDISCIPLINARY ROUNDS WITH ICU TEAM  Critical Care Time devoted to patient care services described in this note is 34 minutes.   Overall, patient is critically ill, prognosis is guarded.  Patient with Multiorgan failure and at high risk for cardiac arrest and death.   Patient is DNR, prognosis is very poor.  Corrin Parker, M.D.  [facility]  Medical Director [place] Director [facility] Cardio-Pulmonary Department

## 2020-02-09 NOTE — Progress Notes (Addendum)
Subjective: Continues to have tenuous respiratory status Now with worsening renal failure  Per CCM notes, cannot prone due to risk of cardiac arrest with pronation   Exam: Vitals:   02/09/20 1700 02/09/20 1800  BP: (!) 154/84 122/72  Pulse: (!) 129 (!) 129  Resp: (!) 34 (!) 36  Temp: (!) 100.8 F (38.2 C) (!) 100.9 F (38.3 C)  SpO2: 92% 92%   Gen: In bed, no acute distress  Resp: on ventilator  Abd: soft, nt  Neuro: MS: Does not open eyes follow commands, no clear grimace CN: Pupils are reactive bilaterally 3 -> 2 mm, corneals absent today, cough very weak per nursing Motor: No movement to noxious stimulation in her extremities.  Sensory: As above  Pertinent Labs and workup to date: pH 7.18, pCO2 77 on 10/5 at 4 AM --> 7.24 pCO2 61 10/7 AM Ammonia initially 46, then 68 Creatinine 0.77 -> 0.81 Free T4 low at 0.3  WBC 20 10/3 -> 16 on 10/4 -> 20.6 on 10/07  CSF: Glucose 128 (serum 184), WBC 5, RBC 23, Protein 102 VZV PCR negative HSV I/II antibody CSF testing 5.68  CT, CTA and CTV 10/5 personally reviewed, no acute processes   Impression: 54 year old female with acute encephalopathy in the setting of Covid and hypothyroidism. Her markedly elevated TSH could be suggestive that she had subclinical hypothyroidism which can result in significant encephalopathy after acute physiological stress (which she is currently experiencing). Though there was concern for some seizure-like activity, the description sounds more consistent with myoclonus, and EEG was negative. She has had worsening mental status in the setting of requiring re-intubation and continuing to have a significant respiratory acidosis. This alone may explain her mental status, but CTA CTV were performed to exclude stroke or venous thrombosis in the setting of her acute infection. Fortunately these studies are negative. CSF studies are largely reassuring; elevated protein is a non-specific finding and can be seen with  lumbar stenosis, diabetes, etc. 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 Antibody testing is indicative of prior exposure, elevation in titer on serial testing can be used to demonstrate active infection but may not reflect current infection.   Recommendations: -continue acyclovir -MRI brain w/ and w/o contrast if patient medically stable enough for this study  -HSV PCR testing added on the CSF via [facility] -continue treatment of underlying medical issues per CCM.  Lesleigh Noe MD-PhD [facility] [phone]   [facility] coverage for [facility] is from 8 AM to 4 AM in-house and 4 PM to 8 PM by telephone/video. 8 PM to 8 AM emergent questions or overnight urgent questions should be addressed to Teleneurology On-call or [facility] neurohospitalist; contact information can be found on AMION  Approximately 30 minutes of critical care time spent, >50% in direct patient care. Addended for charge capture

## 2020-02-09 NOTE — Progress Notes (Addendum)
Weaned patient off sedation for a few hours today- patient still unresponsive- would barely try to cough when ET suctioning.  Patient started working to breath and breathing over the set respiratory rate- Sedation restarted at half the rate from this am.  Patient more comfortable at this time.

## 2020-02-09 NOTE — Progress Notes (Addendum)
Inpatient Diabetes Program Recommendations  AACE/ADA: New Consensus Statement on Inpatient Glycemic Control (2015)  Target Ranges:  Prepandial:   less than 140 mg/dL      Peak postprandial:   less than 180 mg/dL (1-2 hours)      Critically ill patients:  140 - 180 mg/dL    Results for UNIQUE, SILLAS (MRN [id number]) as of 02/09/2020 09:28  Ref. Range 02/08/2020 16:53 02/08/2020 20:19 02/09/2020 00:05 02/09/2020 04:44 02/09/2020 07:18  Glucose-Capillary Latest Ref Range: 70 - 99 mg/dL 359 (H) 327 (H) 329 (H) 297 (H) 257 (H)   Home Diabetes meds: Tresiba 45 units q PM Metformin XR 1000 mg bid Novolog 8 units with meals plus Novolog correction Jardiance 25 mg daily   Current Orders: Levemir 20 units BID      Novolog 0-20 units Q4 Hours      Novolog 5 units Q4 hours Tube Feed Coverage    Tube Feeds running 50cc/hr  MD- Decadron no longer ordered  Please consider the following:  1. Increase Levemir to 25 units BID  2. Increase Novolog Tube Feed Coverage: Novolog 10 units Q4 hours  HOLD if tube feeds if HELD for any reason    --Will follow patient during hospitalization--  Tama Headings RN, MSN, BC-ADM Inpatient Diabetes Coordinator Team Pager [phone] (8a-5p)

## 2020-02-09 NOTE — Progress Notes (Signed)
Notified Dana of blood pressure, received order to increase vasopressin to 0.04 units/min

## 2020-02-09 NOTE — Progress Notes (Addendum)
[place] for Electrolyte Monitoring and Replacement   Recent Labs: Potassium (mmol/L)  Date Value  02/09/2020 4.6  01/17/2012 3.8   Magnesium (mg/dL)  Date Value  02/09/2020 3.1 (H)   Calcium (mg/dL)  Date Value  02/09/2020 9.7   Calcium, Total (mg/dL)  Date Value  01/17/2012 9.5   Albumin (g/dL)  Date Value  02/04/2020 2.4 (L)   Phosphorus (mg/dL)  Date Value  02/09/2020 4.9 (H)   Sodium (mmol/L)  Date Value  02/09/2020 137  01/17/2012 139   Corrected Ca: 10.2 mg/dL  Assessment: 54 year old female with PMHx of anxiety, depression, DM, hypothyroidism, HLD admitted with COVID-19 PNA, DKA, acute metabolic encephalopathy. She has been on an insulin infusion but was transitioned to subcutaneous insulin.  Goal of Therapy:  Electrolytes WNL  Plan:   No electrolyte replacement required at this time  re-check electrolytes in am  Tawnya Crook ,PharmD Clinical Pharmacist 02/09/2020 11:13 AM

## 2020-02-10 DIAGNOSIS — J9601 Acute respiratory failure with hypoxia: Secondary | ICD-10-CM | POA: Diagnosis not present

## 2020-02-10 DIAGNOSIS — U071 COVID-19: Secondary | ICD-10-CM | POA: Diagnosis not present

## 2020-02-10 DIAGNOSIS — E0811 Diabetes mellitus due to underlying condition with ketoacidosis with coma: Secondary | ICD-10-CM | POA: Diagnosis not present

## 2020-02-10 DIAGNOSIS — G9341 Metabolic encephalopathy: Secondary | ICD-10-CM | POA: Diagnosis not present

## 2020-02-10 LAB — CBC WITH DIFFERENTIAL/PLATELET
Abs Immature Granulocytes: 0.76 10*3/uL — ABNORMAL HIGH (ref 0.00–0.07)
Basophils Absolute: 0.1 10*3/uL (ref 0.0–0.1)
Basophils Relative: 0 %
Eosinophils Absolute: 0 10*3/uL (ref 0.0–0.5)
Eosinophils Relative: 0 %
HCT: 32.8 % — ABNORMAL LOW (ref 36.0–46.0)
Hemoglobin: 9.5 g/dL — ABNORMAL LOW (ref 12.0–15.0)
Immature Granulocytes: 4 %
Lymphocytes Relative: 2 %
Lymphs Abs: 0.4 10*3/uL — ABNORMAL LOW (ref 0.7–4.0)
MCH: 30.4 pg (ref 26.0–34.0)
MCHC: 29 g/dL — ABNORMAL LOW (ref 30.0–36.0)
MCV: 104.8 fL — ABNORMAL HIGH (ref 80.0–100.0)
Monocytes Absolute: 1.2 10*3/uL — ABNORMAL HIGH (ref 0.1–1.0)
Monocytes Relative: 5 %
Neutro Abs: 19.3 10*3/uL — ABNORMAL HIGH (ref 1.7–7.7)
Neutrophils Relative %: 89 %
Platelets: 257 10*3/uL (ref 150–400)
RBC: 3.13 MIL/uL — ABNORMAL LOW (ref 3.87–5.11)
RDW: 14.8 % (ref 11.5–15.5)
WBC Morphology: INCREASED
WBC: 21.7 10*3/uL — ABNORMAL HIGH (ref 4.0–10.5)
nRBC: 1.2 % — ABNORMAL HIGH (ref 0.0–0.2)

## 2020-02-10 LAB — GLUCOSE, CAPILLARY
Glucose-Capillary: 168 mg/dL — ABNORMAL HIGH (ref 70–99)
Glucose-Capillary: 190 mg/dL — ABNORMAL HIGH (ref 70–99)
Glucose-Capillary: 206 mg/dL — ABNORMAL HIGH (ref 70–99)
Glucose-Capillary: 293 mg/dL — ABNORMAL HIGH (ref 70–99)
Glucose-Capillary: 55 mg/dL — ABNORMAL LOW (ref 70–99)

## 2020-02-10 LAB — BASIC METABOLIC PANEL
Anion gap: 8 (ref 5–15)
BUN: 109 mg/dL — ABNORMAL HIGH (ref 6–20)
CO2: 24 mmol/L (ref 22–32)
Calcium: 9.5 mg/dL (ref 8.9–10.3)
Chloride: 105 mmol/L (ref 98–111)
Creatinine, Ser: 2.36 mg/dL — ABNORMAL HIGH (ref 0.44–1.00)
GFR, Estimated: 23 mL/min — ABNORMAL LOW (ref >60)
Glucose, Bld: 350 mg/dL — ABNORMAL HIGH (ref 70–99)
Potassium: 5.7 mmol/L — ABNORMAL HIGH (ref 3.5–5.1)
Sodium: 137 mmol/L (ref 135–145)

## 2020-02-10 LAB — LACTIC ACID, PLASMA: Lactic Acid, Venous: 4.1 mmol/L (ref 0.5–1.9)

## 2020-02-10 LAB — MAGNESIUM: Magnesium: 3.2 mg/dL — ABNORMAL HIGH (ref 1.7–2.4)

## 2020-02-10 LAB — POTASSIUM: Potassium: 5.1 mmol/L (ref 3.5–5.1)

## 2020-02-10 LAB — PROCALCITONIN: Procalcitonin: 1.96 ng/mL

## 2020-02-10 LAB — PHOSPHORUS: Phosphorus: 5.8 mg/dL — ABNORMAL HIGH (ref 2.5–4.6)

## 2020-02-10 MED ORDER — SODIUM CHLORIDE 0.9 % IV BOLUS
500.0000 mL | Freq: Once | INTRAVENOUS | Status: AC
  Administered 2020-02-10: 500 mL via INTRAVENOUS

## 2020-02-10 MED ORDER — DEXTROSE 5 % IV SOLN
10.0000 mg/kg | Freq: Two times a day (BID) | INTRAVENOUS | Status: DC
  Administered 2020-02-10 – 2020-02-12 (×5): 700 mg via INTRAVENOUS
  Filled 2020-02-10 (×6): qty 14

## 2020-02-10 MED ORDER — METOPROLOL TARTRATE 5 MG/5ML IV SOLN: 2.5000 mg | INTRAVENOUS | Status: AC

## 2020-02-10 MED ORDER — METOPROLOL TARTRATE 5 MG/5ML IV SOLN
INTRAVENOUS | Status: AC
  Administered 2020-02-10: 2.5 mg via INTRAVENOUS
  Filled 2020-02-10: qty 5

## 2020-02-10 MED ORDER — INSULIN ASPART 100 UNIT/ML ~~LOC~~ SOLN
14.0000 [IU] | SUBCUTANEOUS | Status: DC
  Administered 2020-02-10 (×2): 14 [IU] via SUBCUTANEOUS
  Filled 2020-02-10 (×2): qty 1

## 2020-02-10 MED ORDER — SODIUM CHLORIDE 0.9 % IV SOLN
2.0000 g | Freq: Two times a day (BID) | INTRAVENOUS | Status: DC
  Administered 2020-02-10 – 2020-02-11 (×5): 2 g via INTRAVENOUS
  Filled 2020-02-10 (×7): qty 2

## 2020-02-10 MED ORDER — INSULIN ASPART 100 UNIT/ML IV SOLN
10.0000 [IU] | Freq: Once | INTRAVENOUS | Status: DC
  Filled 2020-02-10: qty 0.1

## 2020-02-10 MED ORDER — SODIUM BICARBONATE 8.4 % IV SOLN
50.0000 meq | Freq: Once | INTRAVENOUS | Status: AC
  Administered 2020-02-10: 50 meq via INTRAVENOUS
  Filled 2020-02-10: qty 50

## 2020-02-10 MED ORDER — NOREPINEPHRINE 16 MG/250ML-% IV SOLN
0.0000 ug/min | INTRAVENOUS | Status: DC
  Administered 2020-02-10: 15 ug/min via INTRAVENOUS
  Administered 2020-02-10: 2 ug/min via INTRAVENOUS
  Administered 2020-02-12: 5 ug/min via INTRAVENOUS
  Administered 2020-02-14: 3 ug/min via INTRAVENOUS
  Filled 2020-02-10 (×5): qty 250

## 2020-02-10 MED ORDER — DEXTROSE 50 % IV SOLN
INTRAVENOUS | Status: AC
  Administered 2020-02-11: 50 mL
  Filled 2020-02-10: qty 50

## 2020-02-10 NOTE — Progress Notes (Signed)
[place] for Electrolyte Monitoring and Replacement   Recent Labs: Potassium (mmol/L)  Date Value  02/10/2020 5.7 (H)  01/17/2012 3.8   Magnesium (mg/dL)  Date Value  02/10/2020 3.2 (H)   Calcium (mg/dL)  Date Value  02/10/2020 9.5   Calcium, Total (mg/dL)  Date Value  01/17/2012 9.5   Albumin (g/dL)  Date Value  02/04/2020 2.4 (L)   Phosphorus (mg/dL)  Date Value  02/10/2020 5.8 (H)   Sodium (mmol/L)  Date Value  02/10/2020 137  01/17/2012 139   Corrected Ca: 10.2 mg/dL  Assessment: 54 year old female with PMHx of anxiety, depression, DM, hypothyroidism, HLD admitted with COVID-19 PNA, DKA, acute metabolic encephalopathy. She has been on an insulin infusion but was transitioned to subcutaneous insulin.  Goal of Therapy:  Electrolytes WNL  Plan:   Potassium elevated, treatment ordered by provider  re-check electrolytes in am  Tawnya Crook ,PharmD Clinical Pharmacist 02/10/2020 8:04 AM

## 2020-02-10 NOTE — Progress Notes (Signed)
Inpatient Diabetes Program Recommendations  AACE/ADA: New Consensus Statement on Inpatient Glycemic Control (2015)  Target Ranges:  Prepandial:   less than 140 mg/dL      Peak postprandial:   less than 180 mg/dL (1-2 hours)      Critically ill patients:  140 - 180 mg/dL    Results for CELENA, LANIUS (MRN [id number]) as of 02/09/2020 09:28  Ref. Range 02/08/2020 16:53 02/08/2020 20:19 02/09/2020 00:05 02/09/2020 04:44 02/09/2020 07:18  Glucose-Capillary Latest Ref Range: 70 - 99 mg/dL 359 (H) 327 (H) 329 (H) 297 (H) 257 (H)   Home Diabetes meds: Tresiba 45 units q PM Metformin XR 1000 mg bid Novolog 8 units with meals plus Novolog correction Jardiance 25 mg daily   Current Orders: Levemir 25 units BID      Novolog 0-20 units Q4 Hours      Novolog 10 units Q4 hours Tube Feed Coverage   Tube Feeds running 50cc/hr  Second dose of Levemir to be given this am.  Please consider the following:  1. Increase Novolog Tube Feed Coverage: Novolog 14 units Q4 hours  HOLD if tube feeds if HELD for any reason    --Will follow patient during hospitalization--  Tama Headings RN, MSN, BC-ADM Inpatient Diabetes Coordinator Team Pager [phone] (8a-5p)

## 2020-02-10 NOTE — Progress Notes (Signed)
CRITICAL CARE NOTE  54 yo female with a PMH of Acute Pancreatitis, Hypothyroidism, HLD, Type II Diabetes Mellitus, HTN, H Pylori, Depression, Anemia, DDD, and Fatty Liver Disease. She presented to [facility] ER via EMS on 09/28 with altered mental status and c/o "not feeling well." Upon arrival to the ER pt alert to self only. She was prescribed azithromycin and prednisone due to acute respiratory failure by her PCP on 09/26 while awaiting for final COVID-19 results. In the ER lab results ruled pt in for DKA and metabolic acidosis, therefore insulin gtt initiated. COVID-19 positive and CXR concerning for  multifocal pneumonia. CTA Chest negative for PE or acute aortic syndrome, but concerning for multifocal pneumonia. CT Head negative for acute intracranial findings. She was subsequently admitted to the stepdown unit per hospitalist team for additional workup and treatment, but remained in the ER pending bed availability. PCCM team consulted on 09/29 to assist with management of COVID pneumonia and acute metabolic encephalopathy.    54 yo female admitted with acute metabolic encephalopathy, acute metabolic acidosis, DKA, and COVID-19 pneumonia   SIGNIFICANT EVENTS/STUDIES:  09/28: CT Head revealed no acute intracranial findings. Hypoattenuating focus in the right basal ganglia compatible with remote lacunar infarct versus prominent perivascular space as detailed on comparison. 09/28: CTA Chest revealed no pulmonary embolus or acute aortic syndrome. Multifocal, peripheral predominant ground glass opacities, most consistent with multifocal pneumonia, including viral pneumonia. 09/29: Pt admitted to stepdown unit per hospitalist team, however remained in the ER pending bed availability 09/29: PCCM team consulted to assist with management of COVID pneumonia and acute metabolic encephalopathy 7/78: Patient is weaned to 35% on ventilator, weaned off sedation with mentation slowly waking up but not  following verbal communication yet. Will hold off on EEG for now due to SBT and awakening trial. Some concern for possible seizure activity overnight, we are monitoring closely and will perform EEG if any re-emergence of symptoms is noted.  02/01/20- patient is liberated from MV, she still is encephalopathic its unclear if she had central event. I discussed case with neurology and there is plan for repeat EEG.  10/3- patient got re-intubated overnight due to encephalopathy which seems to be chronic recurrent. I have discussed with husband most recent events and care plan 10/4- patient remains critically ill, neurologic evaluation on going 10/5- Worsening hypoxia when patient turned supine from prone, requiring 100% FiO2 and deep sedation/paralysis 10/6-not tolerating proning, looks miserable on the vent,agonal breathing 10/7severe resp failure, family updated, confrimed DNR status 10/9 severe ARDS 10/10 severe ARDS, severe shock, patient on multiple pressors   CC  follow up respiratory failure  SUBJECTIVE Patient remains critically ill Prognosis is guarded multiorgan failure  Severe shock Patient is in process of dying Family at bedside    BP (!) 155/129   Pulse (!) 152   Temp (!) 103.5 F (39.7 C)   Resp (!) 35   Ht 5' 2.99" (1.6 m)   Wt 96.9 kg   SpO2 92%   BMI 37.85 kg/m    I/O last 3 completed shifts: In: 100 [NG/GT:100] Out: 800 [Urine:800] Total I/O In: -  Out: 200 [Urine:200]  SpO2: 92 % O2 Flow Rate (L/min): 15 L/min FiO2 (%): 100 %  Estimated body mass index is 37.85 kg/m as calculated from the following:   Height as of this encounter: 5' 2.99" (1.6 m).   Weight as of this encounter: 96.9 kg.  REVIEW OF SYSTEMS  PATIENT IS UNABLE TO PROVIDE COMPLETE REVIEW OF SYSTEM  S DUE TO SEVERE CRITICAL ILLNESS AND ENCEPHALOPATHY     COVID-19 DISASTER DECLARATION:   FULL CONTACT PHYSICAL EXAMINATION WAS NOT POSSIBLE DUE TO TREATMENT OF COVID-19   AND CONSERVATION OF PERSONAL PROTECTIVE EQUIPMENT, LIMITED EXAM FINDINGS INCLUDE-   PHYSICAL EXAMINATION:  GENERAL:critically ill appearing, +resp distress NEUROLOGIC: obtunded, GCS<8   Patient assessed or the symptoms described in the history of present illness.  In the context of the Global COVID-19 pandemic, which necessitated consideration that the patient might be at risk for infection with the SARS-CoV-2 virus that causes COVID-19, Institutional protocols and algorithms that pertain to the evaluation of patients at risk for COVID-19 are in a state of rapid change based on information released by regulatory bodies including the CDC and federal and state organizations. These policies and algorithms were followed during the patient's care while in hospital.    MEDICATIONS: I have reviewed all medications and confirmed regimen as documented   CULTURE RESULTS   Recent Results (from the past 240 hour(s))  Culture, respiratory     Status: None   Collection Time: 01/31/20  8:31 AM  Result Value Ref Range Status   Specimen Description   Final    EXPECTORATED SPUTUM Performed at [facility], [address] [place], [place] [address]    Special Requests   Final    NONE Performed at [facility], [address] [place], [place] [address]    Gram Stain   Final    FEW WBC PRESENT, PREDOMINANTLY PMN MODERATE GRAM POSITIVE COCCI RARE GRAM NEGATIVE RODS    Culture   Final    FEW Consistent with normal respiratory flora. Performed at [facility], [place] [address], [place], [place] [address]    Report Status 02/02/2020 FINAL  Final  CSF culture     Status: None   Collection Time: 02/05/20  2:19 PM   Specimen: PATH Cytology CSF; Cerebrospinal Fluid  Result Value Ref Range Status   Specimen Description   Final    CSF Performed at [facility], [address] [place], [place] [address]    Special Requests   Final    NONE Performed at [facility], [address] [place], [place] [address]    Gram Stain   Final    CYTOSPIN SLIDE WBC PRESENT, PREDOMINANTLY MONONUCLEAR NO ORGANISMS SEEN RED BLOOD CELLS PRESENT Performed at [facility], [address], [place], [place] [address]    Culture   Final    NO GROWTH 3 DAYS Performed at [facility], [place] [address] [place], [place] [address]    Report Status 02/09/2020 FINAL  Final           Indwelling Urinary Catheter continued, requirement due to   Reason to continue Indwelling Urinary Catheter strict Intake/Output monitoring for hemodynamic instability   Central Line/ continued, requirement due to  Reason to continue [place] of central venous pressure or other hemodynamic parameters and poor IV access   Ventilator continued, requirement due to severe respiratory failure   Ventilator Sedation RASS 0 to -2      ASSESSMENT AND PLAN SYNOPSIS  Acute hypoxemic respiratory failure due to COVID-19 pneumonia / ARDS complicated by severe shock and sepsis with acute progressive renal failure  Patient with multiorgan failure-patient is in dying process  Severe ACUTE Hypoxic and Hypercapnic Respiratory Failure -continue Full MV support -continue Bronchodilator Therapy -Wean Fio2 and PEEP as tolerated -VAP/VENT bundle implementation  ACUTE KIDNEY  INJURY/Renal Failure -continue Foley Catheter-assess need -Avoid nephrotoxic agents -Follow urine output, BMP -Ensure adequate renal perfusion, optimize oxygenation -Renal dose medications     NEUROLOGY Acute toxic metabolic encephalopathy, need for sedation Goal RASS -2 to -3  SHOCK-SEPSIS/CARDIOGENIC -use vasopressors to keep MAP>65 -follow ABG and LA -follow up cultures   CARDIAC ICU monitoring  ID -continue IV abx as prescibed -follow up cultures  GI GI PROPHYLAXIS as indicated   ENDO - will use ICU hypoglycemic\Hyperglycemia protocol if indicated      ELECTROLYTES -follow labs as needed -replace as needed -pharmacy consultation and following   DVT/GI PRX ordered and assessed TRANSFUSIONS AS NEEDED MONITOR FSBS I Assessed the need for Labs I Assessed the need for Foley I Assessed the need for Central Venous Line Family Discussion when available I Assessed the need for Mobilization I made an Assessment of medications to be adjusted accordingly Safety Risk assessment completed    Critical Care Time devoted to patient care services described in this note is 45 minutes.   Overall, patient is critically ill, prognosis is guarded.  Patient with Multiorgan failure and at high risk for cardiac arrest and death.  Patient is DNR, patient will likely die in next 76 hrs   Leslie Langille Patricia Pesa, M.D.  [facility]  Medical Director [place] Director [facility] Cardio-Pulmonary Department

## 2020-02-10 NOTE — Progress Notes (Signed)
Pharmacy Antibiotic Note  Marissa Cook is a 54 y.o. female admitted on 01/11/2020 with pneumonia.  Pharmacy has been consulted for Cefepime dosing.  Plan: Cefepime 2 gm IV Q12H ordered to start on 10/10 @ 0100  Height: 5' 2.99" (160 cm) Weight: 96.9 kg (213 lb 10 oz) IBW/kg (Calculated) : 52.38  Temp (24hrs), Avg:100.9 F (38.3 C), Min:98.8 F (37.1 C), Max:102.6 F (39.2 C)  Recent Labs  Lab 02/05/20 0342 02/05/20 0342 02/06/20 0521 02/06/20 0521 02/07/20 0313 02/07/20 1818 02/07/20 2315 02/08/20 0324 02/08/20 0519 02/09/20 0500 02/09/20 0613  WBC 16.0*  --  14.7*  --  20.6*  --   --  24.1*  --   --  24.8*  CREATININE 0.81   < > 1.25*   < > 1.46* 1.73* 1.65*  --  1.62* 2.14*  --    < > = values in this interval not displayed.    Estimated Creatinine Clearance: 34.1 mL/min (A) (by C-G formula based on SCr of 2.14 mg/dL (H)).    Allergies  Allergen Reactions  . Sulfasalazine     UNSPECIFIED REACTION     Antimicrobials this admission:   >>    >>   Dose adjustments this admission:   Microbiology results:  BCx:   UCx:    Sputum:    MRSA PCR:   Thank you for allowing pharmacy to be a part of this patient's care.  Vonnetta Akey D 02/10/2020 12:35 AM

## 2020-02-11 ENCOUNTER — Inpatient Hospital Stay: Payer: BC Managed Care – PPO

## 2020-02-11 DIAGNOSIS — J9601 Acute respiratory failure with hypoxia: Secondary | ICD-10-CM | POA: Diagnosis not present

## 2020-02-11 DIAGNOSIS — G9341 Metabolic encephalopathy: Secondary | ICD-10-CM | POA: Diagnosis not present

## 2020-02-11 DIAGNOSIS — E0811 Diabetes mellitus due to underlying condition with ketoacidosis with coma: Secondary | ICD-10-CM | POA: Diagnosis not present

## 2020-02-11 DIAGNOSIS — U071 COVID-19: Secondary | ICD-10-CM | POA: Diagnosis not present

## 2020-02-11 LAB — CBC WITH DIFFERENTIAL/PLATELET
Abs Immature Granulocytes: 1.38 10*3/uL — ABNORMAL HIGH (ref 0.00–0.07)
Basophils Absolute: 0 10*3/uL (ref 0.0–0.1)
Basophils Relative: 0 %
Eosinophils Absolute: 0 10*3/uL (ref 0.0–0.5)
Eosinophils Relative: 0 %
HCT: 36.7 % (ref 36.0–46.0)
Hemoglobin: 11 g/dL — ABNORMAL LOW (ref 12.0–15.0)
Immature Granulocytes: 4 %
Lymphocytes Relative: 4 %
Lymphs Abs: 1.6 10*3/uL (ref 0.7–4.0)
MCH: 30.6 pg (ref 26.0–34.0)
MCHC: 30 g/dL (ref 30.0–36.0)
MCV: 101.9 fL — ABNORMAL HIGH (ref 80.0–100.0)
Monocytes Absolute: 1.9 10*3/uL — ABNORMAL HIGH (ref 0.1–1.0)
Monocytes Relative: 5 %
Neutro Abs: 33.5 10*3/uL — ABNORMAL HIGH (ref 1.7–7.7)
Neutrophils Relative %: 87 %
Platelets: 310 10*3/uL (ref 150–400)
RBC: 3.6 MIL/uL — ABNORMAL LOW (ref 3.87–5.11)
RDW: 14.7 % (ref 11.5–15.5)
WBC Morphology: INCREASED
WBC: 38.4 10*3/uL — ABNORMAL HIGH (ref 4.0–10.5)
nRBC: 1.2 % — ABNORMAL HIGH (ref 0.0–0.2)

## 2020-02-11 LAB — BASIC METABOLIC PANEL
Anion gap: 9 (ref 5–15)
BUN: 116 mg/dL — ABNORMAL HIGH (ref 6–20)
CO2: 24 mmol/L (ref 22–32)
Calcium: 9.4 mg/dL (ref 8.9–10.3)
Chloride: 110 mmol/L (ref 98–111)
Creatinine, Ser: 2.19 mg/dL — ABNORMAL HIGH (ref 0.44–1.00)
GFR, Estimated: 25 mL/min — ABNORMAL LOW (ref >60)
Glucose, Bld: 77 mg/dL (ref 70–99)
Potassium: 5.1 mmol/L (ref 3.5–5.1)
Sodium: 143 mmol/L (ref 135–145)

## 2020-02-11 LAB — GLUCOSE, CAPILLARY
Glucose-Capillary: 104 mg/dL — ABNORMAL HIGH (ref 70–99)
Glucose-Capillary: 115 mg/dL — ABNORMAL HIGH (ref 70–99)
Glucose-Capillary: 129 mg/dL — ABNORMAL HIGH (ref 70–99)
Glucose-Capillary: 143 mg/dL — ABNORMAL HIGH (ref 70–99)
Glucose-Capillary: 64 mg/dL — ABNORMAL LOW (ref 70–99)
Glucose-Capillary: 73 mg/dL (ref 70–99)
Glucose-Capillary: 80 mg/dL (ref 70–99)
Glucose-Capillary: 82 mg/dL (ref 70–99)

## 2020-02-11 LAB — MAGNESIUM: Magnesium: 3.1 mg/dL — ABNORMAL HIGH (ref 1.7–2.4)

## 2020-02-11 LAB — LACTIC ACID, PLASMA: Lactic Acid, Venous: 1.6 mmol/L (ref 0.5–1.9)

## 2020-02-11 LAB — PROCALCITONIN: Procalcitonin: 8.82 ng/mL

## 2020-02-11 LAB — PHOSPHORUS: Phosphorus: 5.8 mg/dL — ABNORMAL HIGH (ref 2.5–4.6)

## 2020-02-11 MED ORDER — DEXTROSE 50 % IV SOLN: 1.0000 | Freq: Once | INTRAVENOUS | Status: AC

## 2020-02-11 MED ORDER — INSULIN ASPART 100 UNIT/ML ~~LOC~~ SOLN
0.0000 [IU] | SUBCUTANEOUS | Status: DC
  Administered 2020-02-11 – 2020-02-12 (×7): 2 [IU] via SUBCUTANEOUS
  Administered 2020-02-13 (×4): 3 [IU] via SUBCUTANEOUS
  Administered 2020-02-13 (×2): 2 [IU] via SUBCUTANEOUS
  Administered 2020-02-13: 3 [IU] via SUBCUTANEOUS
  Administered 2020-02-14 – 2020-02-15 (×8): 2 [IU] via SUBCUTANEOUS
  Filled 2020-02-11 (×22): qty 1

## 2020-02-11 MED ORDER — INSULIN ASPART 100 UNIT/ML ~~LOC~~ SOLN
10.0000 [IU] | SUBCUTANEOUS | Status: DC
  Administered 2020-02-11 – 2020-02-14 (×20): 10 [IU] via SUBCUTANEOUS
  Filled 2020-02-11 (×21): qty 1

## 2020-02-11 MED ORDER — DEXTROSE 50 % IV SOLN
INTRAVENOUS | Status: AC
  Filled 2020-02-11: qty 50

## 2020-02-11 MED ORDER — DEXTROSE 50 % IV SOLN
INTRAVENOUS | Status: AC
  Administered 2020-02-11: 50 mL via INTRAVENOUS
  Filled 2020-02-11: qty 50

## 2020-02-11 MED ORDER — DEXTROSE 50 % IV SOLN
25.0000 mL | Freq: Once | INTRAVENOUS | Status: AC
  Administered 2020-02-11: 25 mL via INTRAVENOUS

## 2020-02-11 MED ORDER — INSULIN DETEMIR 100 UNIT/ML ~~LOC~~ SOLN
20.0000 [IU] | Freq: Two times a day (BID) | SUBCUTANEOUS | Status: DC
  Administered 2020-02-11 – 2020-02-16 (×10): 20 [IU] via SUBCUTANEOUS
  Filled 2020-02-11 (×12): qty 0.2

## 2020-02-11 MED ORDER — DEXTROSE 5 % IV SOLN
INTRAVENOUS | Status: DC
  Administered 2020-02-14: 1000 mL via INTRAVENOUS

## 2020-02-11 MED FILL — Sodium Chloride IV Soln 0.9%: INTRAVENOUS | Qty: 250 | Status: AC

## 2020-02-11 MED FILL — Phenylephrine HCl IV Soln 10 MG/ML: INTRAVENOUS | Qty: 100 | Status: AC

## 2020-02-11 NOTE — Progress Notes (Signed)
Marissa Cook for Electrolyte Monitoring and Replacement   Recent Labs: Potassium (mmol/L)  Date Value  02/11/2020 5.1  01/17/2012 3.8   Magnesium (mg/dL)  Date Value  02/11/2020 3.1 (H)   Calcium (mg/dL)  Date Value  02/11/2020 9.4   Calcium, Total (mg/dL)  Date Value  01/17/2012 9.5   Albumin (g/dL)  Date Value  02/04/2020 2.4 (L)   Phosphorus (mg/dL)  Date Value  02/11/2020 5.8 (H)   Sodium (mmol/L)  Date Value  02/11/2020 143  01/17/2012 139    Assessment: 54 year old female with PMHx of anxiety, depression, DM, hypothyroidism, HLD admitted with COVID-19 PNA, DKA, acute metabolic encephalopathy. She was on an insulin infusion but has been transitioned to subcutaneous insulin.  Vital 1.5 @ trickle rate of 73ml/hr   Goal of Therapy:  Electrolytes WNL  Plan:   No electrolyte replacement warranted today  re-check electrolytes in am  Marissa Cook ,PharmD Clinical Pharmacist 02/11/2020 7:08 AM

## 2020-02-11 NOTE — Progress Notes (Signed)
Nutrition Follow-up  DOCUMENTATION CODES:   Obesity unspecified  INTERVENTION:   Continue Vital 1.5 @ trickle rate of 32m/hr   Once pt medically ready to advance tube feeds, recommend:  Vital 1.5 Cal at 55 mL/hr- Increase by 167mhr q8 hours until goal rate is reached   PROSource 90 mL BID per tube.   Regimen provides 2140 kcal, 133 grams of protein, 1008 mL H2O daily.  NUTRITION DIAGNOSIS:   Inadequate oral intake related to inability to eat as evidenced by NPO status. Ongoing.  GOAL:   Patient will meet greater than or equal to 90% of their needs Met with TF regimen.  MONITOR:   Vent status, Labs, Weight trends, TF tolerance, Skin, I & O's  ASSESSMENT:   [age]ear old female with PMHx of anxiety, depression, DM, hypothyroidism, HLD admitted with COVID-19 PNA, DKA, acute metabolic encephalopathy.   Tube feeds held over the weekend as pt vomiting. Tube feeds restarted yesterday at trickle rate. Pt with possible ileus per MD. KUB with no significant findings. Per MD, continue trickle feeds for now; may increase tomorrow if pt tolerating. Per chart, pt up ~38lbs since admit; pt +15.6L on her I & O's.   Medications reviewed and include: vitamin C, D3, colace, lovenox, insulin, synthroid, miralax, thiamine, zinc, cefepime, pepcid, levophed, phenylephrine, vasopressin  Labs reviewed: K 5.1 wnl, BUN 116(H), creat 2.19(H), P 5.8(H), Mg 3.1(H) Wbc- 38.4(H)  Patient is currently intubated on ventilator support MV: 4.8 L/min Temp (24hrs), Avg:100.9 F (38.3 C), Min:99.2 F (37.3 C), Max:103.4 F (39.7 C)  Propofol: none   MAP- >6546m  UOP- [id number] Order:   Diet Order            Diet NPO time specified  Diet effective now                EDUCATION NEEDS:   No education needs have been identified at this time  Skin:  Skin Assessment: Reviewed RN Assessment  Last BM:  10/11- type 7  Height:   Ht Readings from Last 1 Encounters:  02/02/20 5' 2.99"  (1.6 m)   Weight:   Wt Readings from Last 1 Encounters:  02/08/20 96.9 kg   Ideal Body Weight:  52.3 kg  BMI:  Body mass index is 37.85 kg/m.  Estimated Nutritional Needs:   Kcal:  2052  Protein:  120-130 grams  Fluid:  >/= 2.3 L/day  CaseKoleen Distance RD, LDN Please refer to [facility] and/or RD on-call/weekend/after hours pager

## 2020-02-11 NOTE — Progress Notes (Signed)
Inpatient Diabetes Program Recommendations  AACE/ADA: New Consensus Statement on Inpatient Glycemic Control (2015)  Target Ranges:  Prepandial:   less than 140 mg/dL      Peak postprandial:   less than 180 mg/dL (1-2 hours)      Critically ill patients:  140 - 180 mg/dL   Lab Results  Component Value Date   GLUCAP 82 02/11/2020   HGBA1C 11.2 (H) 01/31/2020    Review of Glycemic Control HomeDiabetes meds: Tresiba 45 units q PM Metformin XR 1000 mg bid Novolog 8 units with meals plus Novolog correction Jardiance 25 mg daily   Current Orders: Levemir 25 units BID                            Novolog 0-20 units Q4 Hours                            Novolog 14 units Q4 hours Tube Feed Coverage  Inpatient Diabetes Program Recommendations:    Since steroids now off, please consider: -Decrease Levemir to 20 units bid -Decrease Novolog Tube Feed Coverage: Novolog 10 units Q4 hours-HOLD if tube feeds if HELD for any reason -Decrease Novolog correction to moderate 0-15 q 4 hrs.  Thank you, Nani Gasser. [employer], RN, MSN, CDE  Diabetes Coordinator Inpatient Glycemic Control Team Team Pager [phone] (8am-5pm) 02/11/2020 10:32 AM

## 2020-02-11 NOTE — Progress Notes (Signed)
Notified Marda Stalker NP of blood glucose of 73 at 0247, received order for another amp of D50 and to restart tube feeding at 37m;/hr

## 2020-02-11 NOTE — Progress Notes (Signed)
CRITICAL CARE NOTE  54 yo female with a PMH of Acute Pancreatitis, Hypothyroidism, HLD, Type II Diabetes Mellitus, HTN, H Pylori, Depression, Anemia, DDD, and Fatty Liver Disease. She presented to [facility] ER via EMS on 09/28 with altered mental status and c/o "not feeling well." Upon arrival to the ER pt alert to self only. She was prescribed azithromycin and prednisone due to acute respiratory failure by her PCP on 09/26 while awaiting for final COVID-19 results. In the ER lab results ruled pt in for DKA and metabolic acidosis, therefore insulin gtt initiated. COVID-19 positive and CXR concerning for  multifocal pneumonia. CTA Chest negative for PE or acute aortic syndrome, but concerning for multifocal pneumonia. CT Head negative for acute intracranial findings. She was subsequently admitted to the stepdown unit per hospitalist team for additional workup and treatment, but remained in the ER pending bed availability. PCCM team consulted on 09/29 to assist with management of COVID pneumonia and acute metabolic encephalopathy.    54 yo female admitted with acute metabolic encephalopathy, acute metabolic acidosis, DKA, and COVID-19 pneumonia   SIGNIFICANT EVENTS/STUDIES:  09/28: CT Head revealed no acute intracranial findings. Hypoattenuating focus in the right basal ganglia compatible with remote lacunar infarct versus prominent perivascular space as detailed on comparison. 09/28: CTA Chest revealed no pulmonary embolus or acute aortic syndrome. Multifocal, peripheral predominant ground glass opacities, most consistent with multifocal pneumonia, including viral pneumonia. 09/29: Pt admitted to stepdown unit per hospitalist team, however remained in the ER pending bed availability 09/29: PCCM team consulted to assist with management of COVID pneumonia and acute metabolic encephalopathy 3/41: Patient is weaned to 35% on ventilator, weaned off sedation with mentation slowly waking up but not  following verbal communication yet. Will hold off on EEG for now due to SBT and awakening trial. Some concern for possible seizure activity overnight, we are monitoring closely and will perform EEG if any re-emergence of symptoms is noted.  02/01/20- patient is liberated from MV, she still is encephalopathic its unclear if she had central event. I discussed case with neurology and there is plan for repeat EEG.  10/3- patient got re-intubated overnight due to encephalopathy which seems to be chronic recurrent. I have discussed with husband most recent events and care plan 10/4- patient remains critically ill, neurologic evaluation on going 10/5- Worsening hypoxia when patient turned supine from prone, requiring 100% FiO2 and deep sedation/paralysis 10/6-not tolerating proning, looks miserable on the vent,agonal breathing 10/7severe resp failure, family updated, confrimed DNR status 10/9 severe ARDS 10/10 severe ARDS, severe shock, patient on multiple pressors 10/11 severe ARDS  CC  follow up respiratory failure  SUBJECTIVE Patient remains critically ill Prognosis is guarded  Vent Mode: PRVC FiO2 (%):  [90 %-100 %] 90 % Set Rate:  [34 bmp] 34 bmp Vt Set:  [500 mL] 500 mL PEEP:  [12 cmH20] 12 cmH20 Plateau Pressure:  [31 cmH20] 31 cmH20  CBC    Component Value Date/Time   WBC 38.4 (H) 02/11/2020 0307   RBC 3.60 (L) 02/11/2020 0307   HGB 11.0 (L) 02/11/2020 0307   HCT 36.7 02/11/2020 0307   PLT 310 02/11/2020 0307   MCV 101.9 (H) 02/11/2020 0307   MCH 30.6 02/11/2020 0307   MCHC 30.0 02/11/2020 0307   RDW 14.7 02/11/2020 0307   LYMPHSABS 1.6 02/11/2020 0307   MONOABS 1.9 (H) 02/11/2020 0307   EOSABS 0.0 02/11/2020 0307   BASOSABS 0.0 02/11/2020 0307   BMP Latest Ref Rng & Units 02/11/2020 02/10/2020  02/10/2020  Glucose 70 - 99 mg/dL 77 - 350(H)  BUN 6 - 20 mg/dL 116(H) - 109(H)  Creatinine 0.44 - 1.00 mg/dL 2.19(H) - 2.36(H)  Sodium 135 - 145 mmol/L 143 - 137   Potassium 3.5 - 5.1 mmol/L 5.1 5.1 5.7(H)  Chloride 98 - 111 mmol/L 110 - 105  CO2 22 - 32 mmol/L 24 - 24  Calcium 8.9 - 10.3 mg/dL 9.4 - 9.5      BP 121/77   Pulse (!) 131   Temp 99.3 F (37.4 C)   Resp (!) 38   Ht 5' 2.99" (1.6 m)   Wt 96.9 kg   SpO2 98%   BMI 37.85 kg/m    I/O last 3 completed shifts: In: 195 [Other:45; NG/GT:150] Out: 2900 [Urine:1750; Emesis/NG output:1150] No intake/output data recorded.  SpO2: 98 % O2 Flow Rate (L/min): 15 L/min FiO2 (%): 90 %  Estimated body mass index is 37.85 kg/m as calculated from the following:   Height as of this encounter: 5' 2.99" (1.6 m).   Weight as of this encounter: 96.9 kg.  SIGNIFICANT EVENTS   REVIEW OF SYSTEMS  PATIENT IS UNABLE TO PROVIDE COMPLETE REVIEW OF SYSTEMS DUE TO SEVERE CRITICAL ILLNESS      COVID-19 DISASTER DECLARATION:   FULL CONTACT PHYSICAL EXAMINATION WAS NOT POSSIBLE DUE TO TREATMENT OF COVID-19  AND CONSERVATION OF PERSONAL PROTECTIVE EQUIPMENT, LIMITED EXAM FINDINGS INCLUDE-   PHYSICAL EXAMINATION:  GENERAL:critically ill appearing, +resp distress NEUROLOGIC: obtunded, GCS<8   Patient assessed or the symptoms described in the history of present illness.  In the context of the Global COVID-19 pandemic, which necessitated consideration that the patient might be at risk for infection with the SARS-CoV-2 virus that causes COVID-19, Institutional protocols and algorithms that pertain to the evaluation of patients at risk for COVID-19 are in a state of rapid change based on information released by regulatory bodies including the CDC and federal and state organizations. These policies and algorithms were followed during the patient's care while in hospital.    MEDICATIONS: I have reviewed all medications and confirmed regimen as documented   CULTURE RESULTS   Recent Results (from the past 240 hour(s))  CSF culture     Status: None   Collection Time: 02/05/20  2:19 PM   Specimen:  PATH Cytology CSF; Cerebrospinal Fluid  Result Value Ref Range Status   Specimen Description   Final    CSF Performed at [facility], [address] [place], [place] [address]    Special Requests   Final    NONE Performed at [facility], [address] [place], [place] [address]    Gram Stain   Final    CYTOSPIN SLIDE WBC PRESENT, PREDOMINANTLY MONONUCLEAR NO ORGANISMS SEEN RED BLOOD CELLS PRESENT Performed at [facility], [address], [place], [place] [address]    Culture   Final    NO GROWTH 3 DAYS Performed at [facility], [place] [address] [place],  [address]    Report Status 02/09/2020 FINAL  Final  Culture, respiratory (non-expectorated)     Status: None (Preliminary result)   Collection Time: 02/10/20  3:15 AM   Specimen: Tracheal Aspirate; Respiratory  Result Value Ref Range Status   Specimen Description   Final    TRACHEAL ASPIRATE Performed at [facility], [address], [place],  [address]    Special Requests   Final    NONE Performed at [facility], [phone] [place], [place] [address]    Gram Stain   Final    RARE WBC PRESENT, PREDOMINANTLY PMN ABUNDANT GRAM POSITIVE RODS Performed at [facility], [place] [address], [place], [place] [address]    Culture PENDING  Incomplete   Report Status PENDING  Incomplete          IMAGING    No results found.   Nutrition Status: Nutrition Problem: Inadequate oral intake Etiology: inability to eat Signs/Symptoms: NPO status Interventions: Tube feeding, Prostat, MVI     Indwelling Urinary Catheter continued, requirement due to   Reason to continue Indwelling Urinary Catheter strict Intake/Output monitoring for hemodynamic instability   Central Line/ continued, requirement due to  Reason to continue [place] of central venous pressure or other hemodynamic parameters and poor IV access   Ventilator  continued, requirement due to severe respiratory failure   Ventilator Sedation RASS 0 to -2      ASSESSMENT AND PLAN SYNOPSIS Acute hypoxemic respiratory failure due to COVID-19 pneumonia / ARDS complicated by severe shock and sepsis with acute progressive renal failure  Patient with multiorgan failure-    Severe ACUTE Hypoxic and Hypercapnic Respiratory Failure -continue Full MV support -continue Bronchodilator Therapy -Wean Fio2 and PEEP as tolerated -VAP/VENT bundle implementation   Morbid obesity, possible OSA.   Will certainly impact respiratory mechanics, ventilator weaning Suspect will need to consider additional PEEP  ACUTE KIDNEY INJURY/Renal Failure -continue Foley Catheter-assess need -Avoid nephrotoxic agents -Follow urine output, BMP -Ensure adequate renal perfusion, optimize oxygenation -Renal dose medications     NEUROLOGY Acute toxic metabolic encephalopathy, need for sedation Goal RASS -2 to -3  CARDIAC ICU monitoring  ID -continue IV abx as prescibed -follow up cultures  GI GI PROPHYLAXIS as indicated   DIET-->TF's as tolerated Constipation protocol as indicated  ENDO - will use ICU hypoglycemic\Hyperglycemia protocol if indicated     ELECTROLYTES -follow labs as needed -replace as needed -pharmacy consultation and following   DVT/GI PRX ordered and assessed TRANSFUSIONS AS NEEDED MONITOR FSBS I Assessed the need for Labs I Assessed the need for Foley I Assessed the need for Central Venous Line Family Discussion when available I Assessed the need for Mobilization I made an Assessment of medications to be adjusted accordingly Safety Risk assessment completed   CASE DISCUSSED IN MULTIDISCIPLINARY ROUNDS WITH ICU TEAM  Critical Care Time devoted to patient care services described in this note is 35 minutes.   Overall, patient is critically ill, prognosis is guarded.  Patient with Multiorgan failure and at high risk for  cardiac arrest and death.   Patient is DNR  Corrin Parker, M.D.  [facility]  Medical Director [place] Director [facility] Cardio-Pulmonary Department

## 2020-02-11 NOTE — Progress Notes (Signed)
Blood glucose at 2343 was 55, notified Marda Stalker NP and administered an amp of D50. Will recheck glucose at 0110

## 2020-02-11 NOTE — Consult Note (Signed)
Consultation Note Date: 02/11/2020   Patient Name: Marissa Cook  DOB: [birth year]  MRN: [id number]  Age / Sex: 54 y.o., female  PCP: Marissa Crouch, MD Referring Physician: Flora Lipps, MD  Reason for Consultation: Establishing goals of care  HPI/Patient Profile: 54 yo female with a PMH of Acute Pancreatitis, Hypothyroidism, HLD, Type II Diabetes Mellitus, HTN, H Pylori, Depression, Anemia, DDD, and Fatty Liver Disease. She presented to [facility] ER via EMS on 09/28 with altered mental status and c/o "not feeling well." Upon arrival to the ER pt alert to self only.   Clinical Assessment and Goals of Care: Patient is resting in bed on ventilator.  Spoke with husband. They have 2 children. Marissa Cook works from home for [employer]. He states she does not go places except to get her hair and lashes done.   He denies any assistive device use by her.  We discussed her diagnosis, prognosis, GOC, EOL wishes disposition and options.  A detailed discussion was had today regarding advanced directives.  Concepts specific to code status, artifical feeding and hydration, IV antibiotics and rehospitalization were discussed.  The difference between an aggressive medical intervention path and a comfort care path was discussed.  Values and goals of care important to patient and family were attempted to be elicited.  Discussed limitations of medical interventions to prolong quality of life in some situations and discussed the concept of human mortality.  Plans for family meeting tomorrow.     SUMMARY OF RECOMMENDATIONS   Family meeting at 10:45 tomorrow.       Primary Diagnoses: Present on Admission: . DKA (diabetic ketoacidoses) . Acute metabolic encephalopathy . Hypothyroidism . Depression . Pneumonia due to COVID-19 virus . Acute hypoxemic respiratory failure due to COVID-19 [facility]   I have reviewed the  medical record, interviewed the patient and family, and examined the patient. The following aspects are pertinent.  Past Medical History:  Diagnosis Date  . Anxiety   . DDD (degenerative disc disease), lumbar   . Depression   . Diabetes mellitus without complication ([place])   . Hyperlipidemia   . Hypothyroidism   . Pancreatitis    Social History   Socioeconomic History  . Marital status: Married    Spouse name: Not on file  . Number of children: Not on file  . Years of education: Not on file  . Highest education level: Not on file  Occupational History  . Not on file  Tobacco Use  . Smoking status: Never Smoker  . Smokeless tobacco: Never Used  Substance and Sexual Activity  . Alcohol use: No  . Drug use: No  . Sexual activity: Not on file  Other Topics Concern  . Not on file  Social History Narrative  . Not on file   Social Determinants of Health   Financial Resource Strain:   . Difficulty of Paying Living Expenses: Not on file  Food Insecurity:   . Worried About Charity fundraiser in the Last Year: Not on file  .  Ran Out of Food in the Last Year: Not on file  Transportation Needs:   . Lack of Transportation (Medical): Not on file  . Lack of Transportation (Non-Medical): Not on file  Physical Activity:   . Days of Exercise per Week: Not on file  . Minutes of Exercise per Session: Not on file  Stress:   . Feeling of Stress : Not on file  Social Connections:   . Frequency of Communication with Friends and Family: Not on file  . Frequency of Social Gatherings with Friends and Family: Not on file  . Attends Religious Services: Not on file  . Active Member of Clubs or Organizations: Not on file  . Attends Archivist Meetings: Not on file  . Marital Status: Not on file   History reviewed. No pertinent family history. Scheduled Meds: . vitamin C  500 mg Per Tube Daily  . chlorhexidine gluconate (MEDLINE KIT)  15 mL Mouth Rinse BID  . Chlorhexidine  Gluconate Cloth  6 each Topical Daily  . cholecalciferol  1,000 Units Per Tube Daily  . docusate  100 mg Per Tube BID  . enoxaparin (LOVENOX) injection  40 mg Subcutaneous Q24H  . feeding supplement (PROSource TF)  90 mL Per Tube BID  . fentaNYL (SUBLIMAZE) injection  50 mcg Intravenous Once  . insulin aspart  0-15 Units Subcutaneous Q4H  . insulin aspart  10 Units Intravenous Once  . insulin aspart  10 Units Subcutaneous Q4H  . insulin detemir  20 Units Subcutaneous BID  . levothyroxine  300 mcg Per Tube Q0600  . mouth rinse  15 mL Mouth Rinse 10 times per day  . polyethylene glycol  17 g Per Tube Daily  . QUEtiapine  100 mg Per Tube QHS  . sodium chloride flush  10-40 mL Intracatheter Q12H  . thiamine injection  100 mg Intravenous Daily  . vecuronium  10 mg Intravenous Once  . zinc sulfate  220 mg Per Tube Daily   Continuous Infusions: . sodium chloride 10 mL/hr at 02/08/20 0400  . acyclovir 700 mg (02/11/20 1337)  . ceFEPime (MAXIPIME) IV 2 g (02/11/20 1333)  . dextrose 50 mL/hr at 02/11/20 1142  . famotidine (PEPCID) IV 20 mg (02/11/20 1144)  . feeding supplement (VITAL 1.5 CAL) 1,000 mL (02/09/20 1648)  . fentaNYL infusion INTRAVENOUS 100 mcg/hr (02/10/20 2055)  . midazolam 2 mg/hr (02/10/20 1351)  . norepinephrine (LEVOPHED) Adult infusion 10 mcg/min (02/11/20 0603)  . phenylephrine (NEO-SYNEPHRINE) Adult infusion 325 mcg/min (02/11/20 0647)  . vasopressin 0.04 Units/min (02/11/20 1402)   PRN Meds:.acetaminophen, acetaminophen, bisacodyl, chlorpheniramine-HYDROcodone, fentaNYL, guaiFENesin-dextromethorphan, ibuprofen, magnesium hydroxide, midazolam, ondansetron **OR** ondansetron (ZOFRAN) IV, polyethylene glycol, sodium chloride flush, vecuronium Medications Prior to Admission:  Prior to Admission medications   Medication Sig Start Date End Date Taking? Authorizing Provider  aspirin EC 81 MG tablet Take 81 mg by mouth daily. Swallow whole.   Yes [provider]    atorvastatin (LIPITOR) 80 MG tablet Take 1 tablet by mouth daily. 01/17/20 01/16/21 Yes [provider]  DULoxetine (CYMBALTA) 30 MG capsule Take 30 mg by mouth daily. 01/27/2020  Yes [provider]  empagliflozin (JARDIANCE) 25 MG TABS tablet Take 1 tablet by mouth daily. 01/17/20  Yes [provider]  gabapentin (NEURONTIN) 600 MG tablet Take 600 mg by mouth 3 (three) times daily. 01/11/20  Yes [provider]  insulin aspart (NOVOLOG) 100 UNIT/ML injection Inject 8 Units into the skin 3 (three) times daily before meals.  Plus sliding scale, MAX 80 units daily.   Yes [provider]  insulin degludec (TRESIBA FLEXTOUCH) 100 UNIT/ML SOPN FlexTouch Pen Inject 45 Units into the skin at bedtime.    Yes [provider]  levothyroxine (SYNTHROID) 300 MCG tablet Take 300 mcg by mouth daily before breakfast.    Yes [provider]  linaclotide (LINZESS) 145 MCG CAPS capsule Take 1 capsule by mouth daily. 01/17/20  Yes [provider]  metFORMIN (GLUCOPHAGE-XR) 500 MG 24 hr tablet Take 2 tablets by mouth in the morning and at bedtime. 01/17/20 02/23/20 Yes [provider]  metoprolol tartrate (LOPRESSOR) 50 MG tablet Take 1 tablet by mouth in the morning and at bedtime. 01/17/20  Yes [provider]  QUEtiapine (SEROQUEL) 50 MG tablet Take 100 mg by mouth at bedtime. 01/09/2020  Yes [provider]  cyclobenzaprine (FLEXERIL) 10 MG tablet Take 1 tablet (10 mg total) by mouth 3 (three) times daily as needed for muscle spasms. Patient not taking: Reported on 01/12/2020 02/22/19   Newman Pies, MD  docusate sodium (COLACE) 100 MG capsule Take 1 capsule (100 mg total) by mouth 2 (two) times daily. Patient not taking: Reported on 01/08/2020 02/22/19   Newman Pies, MD  oxyCODONE (OXY IR/ROXICODONE) 5 MG immediate release tablet Take 1 tablet (5 mg total) by mouth every 4 (four) hours as needed for moderate pain ((score 4  to 6)). Patient not taking: Reported on 01/31/2020 02/22/19   Newman Pies, MD   Allergies  Allergen Reactions  . Sulfasalazine     UNSPECIFIED REACTION    Review of Systems  Unable to perform ROS   Physical Exam Constitutional:      Comments: On ventilator.      Vital Signs: BP 95/72   Pulse (!) 123   Temp 99.7 F (37.6 C) (Bladder)   Resp (!) 39   Ht 5' 2.99" (1.6 m)   Wt 96.9 kg   SpO2 92%   BMI 37.85 kg/m  Pain Scale: CPOT   Pain Score: 0-No pain   SpO2: SpO2: 92 % O2 Device:SpO2: 92 % O2 Flow Rate: .O2 Flow Rate (L/min): 15 L/min  IO: Intake/output summary:   Intake/Output Summary (Last 24 hours) at 02/11/2020 1608 Last data filed at 02/11/2020 1200 Gross per 24 hour  Intake 45 ml  Output 2800 ml  Net -2755 ml    LBM: Last BM Date: 02/11/20 Baseline Weight: Weight: 72.6 kg Most recent weight: Weight: 96.9 kg     Palliative Assessment/Data:     Time In: 3:40 Time Out: 4:10 Time Total: 30 min Greater than 50%  of this time was spent counseling and coordinating care related to the above assessment and plan.  Signed by: Asencion Gowda, NP   Please contact Palliative Medicine Team phone at [phone] for questions and concerns.  For individual provider: See Shea Evans

## 2020-02-11 NOTE — Progress Notes (Signed)
Updated pts husband Irish Breisch via telephone regarding pts condition and all questions were answered will continue to monitor and assess pt.  Marda Stalker, [place] Pager [phone] (please enter 7 digits) PCCM Consult Pager [phone] (please enter 7 digits)

## 2020-02-12 ENCOUNTER — Inpatient Hospital Stay: Payer: BC Managed Care – PPO

## 2020-02-12 DIAGNOSIS — G9341 Metabolic encephalopathy: Secondary | ICD-10-CM | POA: Diagnosis not present

## 2020-02-12 DIAGNOSIS — Z7189 Other specified counseling: Secondary | ICD-10-CM

## 2020-02-12 DIAGNOSIS — J9601 Acute respiratory failure with hypoxia: Secondary | ICD-10-CM | POA: Diagnosis not present

## 2020-02-12 DIAGNOSIS — Z515 Encounter for palliative care: Secondary | ICD-10-CM

## 2020-02-12 DIAGNOSIS — J1282 Pneumonia due to Coronavirus disease 2019: Secondary | ICD-10-CM | POA: Diagnosis not present

## 2020-02-12 DIAGNOSIS — U071 COVID-19: Secondary | ICD-10-CM | POA: Diagnosis not present

## 2020-02-12 LAB — GLUCOSE, CAPILLARY
Glucose-Capillary: 129 mg/dL — ABNORMAL HIGH (ref 70–99)
Glucose-Capillary: 135 mg/dL — ABNORMAL HIGH (ref 70–99)
Glucose-Capillary: 135 mg/dL — ABNORMAL HIGH (ref 70–99)
Glucose-Capillary: 139 mg/dL — ABNORMAL HIGH (ref 70–99)
Glucose-Capillary: 140 mg/dL — ABNORMAL HIGH (ref 70–99)
Glucose-Capillary: 152 mg/dL — ABNORMAL HIGH (ref 70–99)

## 2020-02-12 LAB — MISC LABCORP TEST (SEND OUT): Labcorp test code: 139800

## 2020-02-12 LAB — CULTURE, RESPIRATORY W GRAM STAIN

## 2020-02-12 LAB — CBC WITH DIFFERENTIAL/PLATELET
Abs Immature Granulocytes: 0.6 10*3/uL — ABNORMAL HIGH (ref 0.00–0.07)
Basophils Absolute: 0 10*3/uL (ref 0.0–0.1)
Basophils Relative: 0 %
Eosinophils Absolute: 0 10*3/uL (ref 0.0–0.5)
Eosinophils Relative: 0 %
HCT: 27.6 % — ABNORMAL LOW (ref 36.0–46.0)
Hemoglobin: 8 g/dL — ABNORMAL LOW (ref 12.0–15.0)
Immature Granulocytes: 3 %
Lymphocytes Relative: 3 %
Lymphs Abs: 0.8 10*3/uL (ref 0.7–4.0)
MCH: 30.7 pg (ref 26.0–34.0)
MCHC: 29 g/dL — ABNORMAL LOW (ref 30.0–36.0)
MCV: 105.7 fL — ABNORMAL HIGH (ref 80.0–100.0)
Monocytes Absolute: 1 10*3/uL (ref 0.1–1.0)
Monocytes Relative: 4 %
Neutro Abs: 20.8 10*3/uL — ABNORMAL HIGH (ref 1.7–7.7)
Neutrophils Relative %: 90 %
Platelets: 176 10*3/uL (ref 150–400)
RBC: 2.61 MIL/uL — ABNORMAL LOW (ref 3.87–5.11)
RDW: 15.1 % (ref 11.5–15.5)
Smear Review: NORMAL
WBC: 23.2 10*3/uL — ABNORMAL HIGH (ref 4.0–10.5)
nRBC: 1.2 % — ABNORMAL HIGH (ref 0.0–0.2)

## 2020-02-12 LAB — BASIC METABOLIC PANEL
Anion gap: 4 — ABNORMAL LOW (ref 5–15)
BUN: 105 mg/dL — ABNORMAL HIGH (ref 6–20)
CO2: 20 mmol/L — ABNORMAL LOW (ref 22–32)
Calcium: 6.8 mg/dL — ABNORMAL LOW (ref 8.9–10.3)
Chloride: 117 mmol/L — ABNORMAL HIGH (ref 98–111)
Creatinine, Ser: 1.65 mg/dL — ABNORMAL HIGH (ref 0.44–1.00)
GFR, Estimated: 35 mL/min — ABNORMAL LOW (ref >60)
Glucose, Bld: 148 mg/dL — ABNORMAL HIGH (ref 70–99)
Potassium: 3.9 mmol/L (ref 3.5–5.1)
Sodium: 141 mmol/L (ref 135–145)

## 2020-02-12 LAB — PHOSPHORUS: Phosphorus: 4.5 mg/dL (ref 2.5–4.6)

## 2020-02-12 LAB — PROCALCITONIN: Procalcitonin: 4.84 ng/mL

## 2020-02-12 LAB — MAGNESIUM: Magnesium: 2.4 mg/dL (ref 1.7–2.4)

## 2020-02-12 MED ORDER — HYDROCORTISONE NA SUCCINATE PF 100 MG IJ SOLR
50.0000 mg | Freq: Four times a day (QID) | INTRAMUSCULAR | Status: DC
  Administered 2020-02-12 – 2020-02-16 (×16): 50 mg via INTRAVENOUS
  Filled 2020-02-12 (×15): qty 2

## 2020-02-12 MED ORDER — STERILE WATER FOR INJECTION IJ SOLN
INTRAMUSCULAR | Status: AC
  Filled 2020-02-12: qty 10

## 2020-02-12 MED ORDER — CALCIUM GLUCONATE-NACL 2-0.675 GM/100ML-% IV SOLN
2.0000 g | Freq: Once | INTRAVENOUS | Status: AC
  Administered 2020-02-12: 2000 mg via INTRAVENOUS
  Filled 2020-02-12: qty 100

## 2020-02-12 MED ORDER — MIDODRINE HCL 5 MG PO TABS
10.0000 mg | ORAL_TABLET | Freq: Three times a day (TID) | ORAL | Status: DC
  Administered 2020-02-12 – 2020-02-16 (×12): 10 mg
  Filled 2020-02-12 (×12): qty 2

## 2020-02-12 MED ORDER — SODIUM CHLORIDE 0.9 % IV SOLN
2.0000 g | INTRAVENOUS | Status: DC
  Administered 2020-02-12 – 2020-02-15 (×4): 2 g via INTRAVENOUS
  Filled 2020-02-12: qty 20
  Filled 2020-02-12 (×4): qty 2

## 2020-02-12 MED FILL — Phenylephrine HCl IV Soln 10 MG/ML: INTRAVENOUS | Qty: 100 | Status: AC

## 2020-02-12 MED FILL — Sodium Chloride IV Soln 0.9%: INTRAVENOUS | Qty: 250 | Status: AC

## 2020-02-12 NOTE — Progress Notes (Signed)
CRITICAL CARE NOTE 54 yo female with a PMH of Acute Pancreatitis, Hypothyroidism, HLD, Type II Diabetes Mellitus, HTN, H Pylori, Depression, Anemia, DDD, and Fatty Liver Disease. She presented to [facility] ER via EMS on 09/28 with altered mental status and c/o "not feeling well." Upon arrival to the ER pt alert to self only. She was prescribed azithromycin and prednisone due to acute respiratory failure by her PCP on 09/26 while awaiting for final COVID-19 results. In the ER lab results ruled pt in for DKA and metabolic acidosis, therefore insulin gtt initiated. COVID-19 positive and CXR concerning for  multifocal pneumonia. CTA Chest negative for PE or acute aortic syndrome, but concerning for multifocal pneumonia. CT Head negative for acute intracranial findings. She was subsequently admitted to the stepdown unit per hospitalist team for additional workup and treatment, but remained in the ER pending bed availability. PCCM team consulted on 09/29 to assist with management of COVID pneumonia and acute metabolic encephalopathy.    54 yo female admitted with acute metabolic encephalopathy, acute metabolic acidosis, DKA, and COVID-19 pneumonia   SIGNIFICANT EVENTS/STUDIES:  09/28: CT Head revealed no acute intracranial findings. Hypoattenuating focus in the right basal ganglia compatible with remote lacunar infarct versus prominent perivascular space as detailed on comparison. 09/28: CTA Chest revealed no pulmonary embolus or acute aortic syndrome. Multifocal, peripheral predominant ground glass opacities, most consistent with multifocal pneumonia, including viral pneumonia. 09/29: Pt admitted to stepdown unit per hospitalist team, however remained in the ER pending bed availability 09/29: PCCM team consulted to assist with management of COVID pneumonia and acute metabolic encephalopathy 1/61: Patient is weaned to 35% on ventilator, weaned off sedation with mentation slowly waking up but not  following verbal communication yet. Will hold off on EEG for now due to SBT and awakening trial. Some concern for possible seizure activity overnight, we are monitoring closely and will perform EEG if any re-emergence of symptoms is noted.  02/01/20- patient is liberated from MV, she still is encephalopathic its unclear if she had central event. I discussed case with neurology and there is plan for repeat EEG.  10/3- patient got re-intubated overnight due to encephalopathy which seems to be chronic recurrent. I have discussed with husband most recent events and care plan 10/4- patient remains critically ill, neurologic evaluation on going 10/5- Worsening hypoxia when patient turned supine from prone, requiring 100% FiO2 and deep sedation/paralysis 10/6-not tolerating proning, looks miserable on the vent,agonal breathing 10/7severe resp failure, family updated, confrimed DNR status 10/9 severe ARDS 10/10 severe ARDS, severe shock, patient on multiple pressors 10/11 severe ARDS, Husband updated   CC  follow up respiratory failure  SUBJECTIVE Patient remains critically ill Prognosis is guarded Severe hypoxia  Vent Mode: PRVC FiO2 (%):  [65 %-100 %] 100 % Set Rate:  [34 bmp] 34 bmp Vt Set:  [500 mL] 500 mL PEEP:  [12 cmH20] 12 cmH20 Plateau Pressure:  [30 cmH20-34 cmH20] 30 cmH20  CBC    Component Value Date/Time   WBC 23.2 (H) 02/12/2020 0426   RBC 2.61 (L) 02/12/2020 0426   HGB 8.0 (L) 02/12/2020 0426   HCT 27.6 (L) 02/12/2020 0426   PLT 176 02/12/2020 0426   MCV 105.7 (H) 02/12/2020 0426   MCH 30.7 02/12/2020 0426   MCHC 29.0 (L) 02/12/2020 0426   RDW 15.1 02/12/2020 0426   LYMPHSABS 0.8 02/12/2020 0426   MONOABS 1.0 02/12/2020 0426   EOSABS 0.0 02/12/2020 0426   BASOSABS 0.0 02/12/2020 0426   BMP Latest  Ref Rng & Units 02/12/2020 02/11/2020 02/10/2020  Glucose 70 - 99 mg/dL 148(H) 77 -  BUN 6 - 20 mg/dL 105(H) 116(H) -  Creatinine 0.44 - 1.00 mg/dL 1.65(H) 2.19(H) -   Sodium 135 - 145 mmol/L 141 143 -  Potassium 3.5 - 5.1 mmol/L 3.9 5.1 5.1  Chloride 98 - 111 mmol/L 117(H) 110 -  CO2 22 - 32 mmol/L 20(L) 24 -  Calcium 8.9 - 10.3 mg/dL 6.8(L) 9.4 -      BP (!) 88/60   Pulse (!) 132   Temp 99.6 F (37.6 C) (Bladder)   Resp (!) 36   Ht 5' 2.99" (1.6 m)   Wt 109.7 kg   SpO2 91%   BMI 42.85 kg/m    I/O last 3 completed shifts: In: 9371.4 [I.V.:6761.3; Other:45; NG/GT:1110; IV Piggyback:1455.1] Out: 1925 [Urine:1775; Stool:150] No intake/output data recorded.  SpO2: 91 % O2 Flow Rate (L/min): 15 L/min FiO2 (%): 100 %  Estimated body mass index is 42.85 kg/m as calculated from the following:   Height as of this encounter: 5' 2.99" (1.6 m).   Weight as of this encounter: 109.7 kg.  SIGNIFICANT EVENTS   REVIEW OF SYSTEMS  PATIENT IS UNABLE TO PROVIDE COMPLETE REVIEW OF SYSTEMS DUE TO SEVERE CRITICAL ILLNESS      COVID-19 DISASTER DECLARATION:   FULL CONTACT PHYSICAL EXAMINATION WAS NOT POSSIBLE DUE TO TREATMENT OF COVID-19  AND CONSERVATION OF PERSONAL PROTECTIVE EQUIPMENT, LIMITED EXAM FINDINGS INCLUDE-   PHYSICAL EXAMINATION:  GENERAL:critically ill appearing, +resp distress NEUROLOGIC: obtunded, GCS<8   Patient assessed or the symptoms described in the history of present illness.  In the context of the Global COVID-19 pandemic, which necessitated consideration that the patient might be at risk for infection with the SARS-CoV-2 virus that causes COVID-19, Institutional protocols and algorithms that pertain to the evaluation of patients at risk for COVID-19 are in a state of rapid change based on information released by regulatory bodies including the CDC and federal and state organizations. These policies and algorithms were followed during the patient's care while in hospital.    MEDICATIONS: I have reviewed all medications and confirmed regimen as documented   CULTURE RESULTS   Recent Results (from the past 240  hour(s))  CSF culture     Status: None   Collection Time: 02/05/20  2:19 PM   Specimen: PATH Cytology CSF; Cerebrospinal Fluid  Result Value Ref Range Status   Specimen Description   Final    CSF Performed at [facility], [address], [place], [place] [address]    Special Requests   Final    NONE Performed at [facility], [address] [place], [place] [address]    Gram Stain   Final    CYTOSPIN SLIDE WBC PRESENT, PREDOMINANTLY MONONUCLEAR NO ORGANISMS SEEN RED BLOOD CELLS PRESENT Performed at [facility], [address], [place], [place] [address]    Culture   Final    NO GROWTH 3 DAYS Performed at [facility], [place] [address] [place], [place] [address]    Report Status 02/09/2020 FINAL  Final  Culture, respiratory (non-expectorated)     Status: None (Preliminary result)   Collection Time: 02/10/20  3:15 AM   Specimen: Tracheal Aspirate; Respiratory  Result Value Ref Range Status   Specimen Description   Final    TRACHEAL ASPIRATE Performed at [facility], [address], [place], [place] [address]    Special Requests   Final  NONE Performed at [facility], [address] [place], [place] [address]    Gram Stain   Final    RARE WBC PRESENT, PREDOMINANTLY PMN ABUNDANT GRAM POSITIVE RODS    Culture   Final    ABUNDANT KLEBSIELLA PNEUMONIAE CULTURE REINCUBATED FOR BETTER GROWTH SUSCEPTIBILITIES TO FOLLOW Performed at [facility], [place] [address], [place], [place] [address]    Report Status PENDING  Incomplete           Indwelling Urinary Catheter continued, requirement due to   Reason to continue Indwelling Urinary Catheter strict Intake/Output monitoring for hemodynamic instability   Central Line/ continued, requirement due to  Reason to continue [place] of central venous pressure or other hemodynamic parameters and poor IV access   Ventilator continued, requirement  due to severe respiratory failure   Ventilator Sedation RASS 0 to -2      ASSESSMENT AND PLAN SYNOPSIS  Acute hypoxemic respiratory failure due to COVID-19 pneumonia / ARDS complicated by severe shock and sepsis with acute progressive renal failure  Patient with multiorgan failure- unable to prone due to high risk for cardiac arrest Patient is DNR      Severe ACUTE Hypoxic and Hypercapnic Respiratory Failure -continue Full MV support -continue Bronchodilator Therapy -Wean Fio2 and PEEP as tolerated -VAP/VENT bundle implementation   Morbid obesity, possible OSA.   Will certainly impact respiratory mechanics, ventilator weaning Suspect will need to consider additional PEEP  ACUTE KIDNEY INJURY/Renal Failure -continue Foley Catheter-assess need -Avoid nephrotoxic agents -Follow urine output, BMP -Ensure adequate renal perfusion, optimize oxygenation -Renal dose medications     NEUROLOGY Acute toxic metabolic encephalopathy, need for sedation Goal RASS -2 to -3  SHOCK-SEPSIS -use vasopressors to keep MAP>65   CARDIAC ICU monitoring  ID -continue IV abx as prescibed -follow up cultures  GI GI PROPHYLAXIS as indicated   DIET-->TF's as tolerated Constipation protocol as indicated  ENDO - will use ICU hypoglycemic\Hyperglycemia protocol if indicated     ELECTROLYTES -follow labs as needed -replace as needed -pharmacy consultation and following   DVT/GI PRX ordered and assessed TRANSFUSIONS AS NEEDED MONITOR FSBS I Assessed the need for Labs I Assessed the need for Foley I Assessed the need for Central Venous Line Family Discussion when available I Assessed the need for Mobilization I made an Assessment of medications to be adjusted accordingly Safety Risk assessment completed   CASE DISCUSSED IN MULTIDISCIPLINARY ROUNDS WITH ICU TEAM  Critical Care Time devoted to patient care services described in this note is 35 minutes.   Overall,  patient is critically ill, prognosis is guarded.  Patient with Multiorgan failure and at high risk for cardiac arrest and death.    Corrin Parker, M.D.  [facility]  Medical Director [place] Director [facility] Cardio-Pulmonary Department

## 2020-02-12 NOTE — Progress Notes (Signed)
Daily Progress Note   Patient Name: Marissa Cook       Date: 02/12/2020 DOB: [birth year]  Age: 54 y.o. MRN#: [id number] Attending Physician: Flora Lipps, MD Primary Care Physician: Idelle Crouch, MD Admit Date: 01/16/2020  Reason for Consultation/Follow-up: Establishing goals of care  Subjective: Patient is resting in bed on ventilator. Patient's husband, daughter Marissa Cook, and patient's brother in law were present for meeting.   We discussed her diagnosis, prognosis, GOC, wishes disposition and options.  A detailed discussion was had today regarding advanced directives.  Concepts specific to code status, artifical feeding and hydration, IV antibiotics and rehospitalization were discussed.  The difference between an aggressive medical intervention path and a comfort care path was discussed.  Values and goals of care important to patient and family were attempted to be elicited.  Discussed limitations of medical interventions to prolong quality of life in some situations and discussed the concept of human mortality.  Patient's husband states Marissa Cook would not want to live as she is. She would not want to go to a ventilator facility or to a nursing facility. He states she would not want a tracheostomy. He states QOL is very important to Marissa Cook, and that they have had conversations between themselves about advanced directive topics. The three discuss a family member Marissa Cook, that the family including the patient, had to make a decision to withdraw care.   Daughter is very tearful stating her mother would come home. Father and father's brother attempted to explain patient would not want to suffer, and would like to find agreement with Marissa Cook. Father and brother exited to talk and visit  with patient.  Sat with Marissa Cook and offered support. After great discussion regarding her faith, life, and family, Marissa Cook states she does not want to lose her mother, but would not want her mother to suffer. She discusses all of the things her mother will never see like her wedding day, and her children when she has them. She states she believes her mother's current status is suffering. She states she would not want CPR for her mother. She would not want dialysis. She would not want a tracheostomy. She advises from her feelings, at the 21 day point (October 18th) where a decision must be made on a tracheostomy, she will want to liberate her mother from  the ventilator and let her be comfortable until she died. She requests her mother's dog be brought to the hospital to see her prior to death.     Length of Stay: 13  Current Medications: Scheduled Meds:  . vitamin C  500 mg Per Tube Daily  . chlorhexidine gluconate (MEDLINE KIT)  15 mL Mouth Rinse BID  . Chlorhexidine Gluconate Cloth  6 each Topical Daily  . cholecalciferol  1,000 Units Per Tube Daily  . docusate  100 mg Per Tube BID  . enoxaparin (LOVENOX) injection  40 mg Subcutaneous Q24H  . feeding supplement (PROSource TF)  90 mL Per Tube BID  . fentaNYL (SUBLIMAZE) injection  50 mcg Intravenous Once  . hydrocortisone sod succinate (SOLU-CORTEF) inj  50 mg Intravenous Q6H  . insulin aspart  0-15 Units Subcutaneous Q4H  . insulin aspart  10 Units Intravenous Once  . insulin aspart  10 Units Subcutaneous Q4H  . insulin detemir  20 Units Subcutaneous BID  . levothyroxine  300 mcg Per Tube Q0600  . mouth rinse  15 mL Mouth Rinse 10 times per day  . midodrine  10 mg Per Tube TID WC  . polyethylene glycol  17 g Per Tube Daily  . QUEtiapine  100 mg Per Tube QHS  . sodium chloride flush  10-40 mL Intracatheter Q12H  . thiamine injection  100 mg Intravenous Daily  . vecuronium  10 mg Intravenous Once  . zinc sulfate  220 mg Per Tube Daily      Continuous Infusions: . sodium chloride 10 mL/hr at 02/08/20 0400  . acyclovir 700 mg (02/12/20 0900)  . cefTRIAXone (ROCEPHIN)  IV 2 g (02/12/20 1228)  . dextrose 50 mL/hr at 02/12/20 0851  . famotidine (PEPCID) IV 20 mg (02/12/20 1226)  . feeding supplement (VITAL 1.5 CAL) 1,000 mL (02/09/20 1648)  . fentaNYL infusion INTRAVENOUS 150 mcg/hr (02/11/20 2153)  . midazolam 2 mg/hr (02/12/20 1230)  . norepinephrine (LEVOPHED) Adult infusion 5 mcg/min (02/12/20 0853)  . phenylephrine (NEO-SYNEPHRINE) Adult infusion 400 mcg/min (02/12/20 1241)  . vasopressin 0.04 Units/min (02/12/20 0847)    PRN Meds: acetaminophen, acetaminophen, bisacodyl, chlorpheniramine-HYDROcodone, fentaNYL, guaiFENesin-dextromethorphan, ibuprofen, magnesium hydroxide, midazolam, ondansetron **OR** ondansetron (ZOFRAN) IV, polyethylene glycol, sodium chloride flush, vecuronium  Physical Exam Constitutional:      Comments: On ventilator.              Vital Signs: BP 109/64   Pulse (!) 128   Temp 99.4 F (37.4 C) (Bladder)   Resp (!) 34   Ht 5' 2.99" (1.6 m)   Wt 109.7 kg   SpO2 99%   BMI 42.85 kg/m  SpO2: SpO2: 99 % O2 Device: O2 Device: Ventilator O2 Flow Rate: O2 Flow Rate (L/min): 15 L/min  Intake/output summary:   Intake/Output Summary (Last 24 hours) at 02/12/2020 1328 Last data filed at 02/12/2020 0400 Gross per 24 hour  Intake 9216.4 ml  Output 525 ml  Net 8691.4 ml   LBM: Last BM Date: 02/12/20 Baseline Weight: Weight: 72.6 kg Most recent weight: Weight: 109.7 kg       Palliative Assessment/Data:      Patient Active Problem List   Diagnosis Date Noted  . Acute respiratory failure ([place])   . Altered mental status   . DKA (diabetic ketoacidoses) 01/30/2020  . Acute metabolic encephalopathy 16/02/9603  . Acute hypoxemic respiratory failure due to COVID-19 ([place]) 01/30/2020  . Pressure injury of skin 01/30/2020  . Hypothyroidism   . Depression   .  COVID-19   . Pneumonia due  to COVID-19 virus   . Spondylolisthesis, lumbar region 02/21/2019    Palliative Care Assessment & Plan    Recommendations/Plan:  Family would not want a tracheostomy or dialysis. No CPR.   Code Status:    Code Status Orders  (From admission, onward)         Start     Ordered   02/06/20 1701  Do not attempt resuscitation (DNR)  Continuous       Question Answer Comment  In the event of cardiac or respiratory ARREST Do not call a "code blue"   In the event of cardiac or respiratory ARREST Do not perform Intubation, CPR, defibrillation or ACLS   In the event of cardiac or respiratory ARREST Use medication by any route, position, wound care, and other measures to relive pain and suffering. May use oxygen, suction and manual treatment of airway obstruction as needed for comfort.      02/06/20 1700        Code Status History    Date Active Date Inactive Code Status Order ID Comments User Context   01/30/2020 1508 02/06/2020 1700 Full Code [id number]  Ottie Glazier, MD ED   01/30/2020 0508 01/30/2020 1508 Full Code [id number]  Mansy, Arvella Merles, MD ED   02/21/2019 1313 02/22/2019 1405 Full Code [id number]  Newman Pies, MD Inpatient   Advance Care Planning Activity       Prognosis:   Very poor.    Care plan was discussed with CCM and RN  Thank you for allowing the Palliative Medicine Team to assist in the care of this patient.   Time In: 11:15 Time Out: 12:45 Total Time 90 min Prolonged Time Billed  yes       Greater than 50%  of this time was spent counseling and coordinating care related to the above assessment and plan.  Asencion Gowda, NP  Please contact Palliative Medicine Team phone at [phone] for questions and concerns.

## 2020-02-12 NOTE — Progress Notes (Signed)
[place] for Electrolyte Monitoring and Replacement   Recent Labs: Potassium (mmol/L)  Date Value  02/12/2020 3.9  01/17/2012 3.8   Magnesium (mg/dL)  Date Value  02/12/2020 2.4   Calcium (mg/dL)  Date Value  02/12/2020 6.8 (L)   Calcium, Total (mg/dL)  Date Value  01/17/2012 9.5   Albumin (g/dL)  Date Value  02/04/2020 2.4 (L)   Phosphorus (mg/dL)  Date Value  02/12/2020 4.5   Sodium (mmol/L)  Date Value  02/12/2020 141  01/17/2012 139    Assessment: 54 year old female with PMHx of anxiety, depression, DM, hypothyroidism, HLD admitted with COVID-19 PNA, DKA, acute metabolic encephalopathy. She was on an insulin infusion but has been transitioned to subcutaneous insulin.  Vital 1.5 @ trickle rate of 11ml/hr   Goal of Therapy:  Electrolytes WNL  Plan:   No electrolyte replacement warranted today  re-check electrolytes in am  Tawnya Crook ,PharmD Clinical Pharmacist 02/12/2020 1:32 PM

## 2020-02-13 DIAGNOSIS — J9601 Acute respiratory failure with hypoxia: Secondary | ICD-10-CM | POA: Diagnosis not present

## 2020-02-13 DIAGNOSIS — G9341 Metabolic encephalopathy: Secondary | ICD-10-CM | POA: Diagnosis not present

## 2020-02-13 DIAGNOSIS — U071 COVID-19: Secondary | ICD-10-CM | POA: Diagnosis not present

## 2020-02-13 LAB — CBC WITH DIFFERENTIAL/PLATELET
Abs Immature Granulocytes: 0.89 10*3/uL — ABNORMAL HIGH (ref 0.00–0.07)
Basophils Absolute: 0.1 10*3/uL (ref 0.0–0.1)
Basophils Relative: 0 %
Eosinophils Absolute: 0 10*3/uL (ref 0.0–0.5)
Eosinophils Relative: 0 %
HCT: 27.8 % — ABNORMAL LOW (ref 36.0–46.0)
Hemoglobin: 8.1 g/dL — ABNORMAL LOW (ref 12.0–15.0)
Immature Granulocytes: 4 %
Lymphocytes Relative: 3 %
Lymphs Abs: 0.6 10*3/uL — ABNORMAL LOW (ref 0.7–4.0)
MCH: 30.8 pg (ref 26.0–34.0)
MCHC: 29.1 g/dL — ABNORMAL LOW (ref 30.0–36.0)
MCV: 105.7 fL — ABNORMAL HIGH (ref 80.0–100.0)
Monocytes Absolute: 0.7 10*3/uL (ref 0.1–1.0)
Monocytes Relative: 3 %
Neutro Abs: 21.3 10*3/uL — ABNORMAL HIGH (ref 1.7–7.7)
Neutrophils Relative %: 90 %
Platelets: 191 10*3/uL (ref 150–400)
RBC: 2.63 MIL/uL — ABNORMAL LOW (ref 3.87–5.11)
RDW: 14.9 % (ref 11.5–15.5)
Smear Review: NORMAL
WBC: 23.5 10*3/uL — ABNORMAL HIGH (ref 4.0–10.5)
nRBC: 1.3 % — ABNORMAL HIGH (ref 0.0–0.2)

## 2020-02-13 LAB — BASIC METABOLIC PANEL
Anion gap: 8 (ref 5–15)
BUN: 118 mg/dL — ABNORMAL HIGH (ref 6–20)
CO2: 19 mmol/L — ABNORMAL LOW (ref 22–32)
Calcium: 7.6 mg/dL — ABNORMAL LOW (ref 8.9–10.3)
Chloride: 113 mmol/L — ABNORMAL HIGH (ref 98–111)
Creatinine, Ser: 1.77 mg/dL — ABNORMAL HIGH (ref 0.44–1.00)
GFR, Estimated: 32 mL/min — ABNORMAL LOW (ref >60)
Glucose, Bld: 171 mg/dL — ABNORMAL HIGH (ref 70–99)
Potassium: 4.7 mmol/L (ref 3.5–5.1)
Sodium: 140 mmol/L (ref 135–145)

## 2020-02-13 LAB — GLUCOSE, CAPILLARY
Glucose-Capillary: 165 mg/dL — ABNORMAL HIGH (ref 70–99)
Glucose-Capillary: 159 mg/dL — ABNORMAL HIGH (ref 70–99)
Glucose-Capillary: 195 mg/dL — ABNORMAL HIGH (ref 70–99)
Glucose-Capillary: 177 mg/dL — ABNORMAL HIGH (ref 70–99)
Glucose-Capillary: 149 mg/dL — ABNORMAL HIGH (ref 70–99)
Glucose-Capillary: 134 mg/dL — ABNORMAL HIGH (ref 70–99)

## 2020-02-13 LAB — MAGNESIUM: Magnesium: 2.8 mg/dL — ABNORMAL HIGH (ref 1.7–2.4)

## 2020-02-13 LAB — PHOSPHORUS: Phosphorus: 5.7 mg/dL — ABNORMAL HIGH (ref 2.5–4.6)

## 2020-02-13 MED ORDER — VITAL 1.5 CAL PO LIQD
1000.0000 mL | ORAL | Status: DC
  Administered 2020-02-13 – 2020-02-14 (×2): 1000 mL

## 2020-02-13 MED FILL — Sodium Chloride IV Soln 0.9%: INTRAVENOUS | Qty: 250 | Status: AC

## 2020-02-13 MED FILL — Phenylephrine HCl IV Soln 10 MG/ML: INTRAVENOUS | Qty: 10 | Status: AC

## 2020-02-13 NOTE — Progress Notes (Signed)
CRITICAL CARE NOTE 54 yo female with a PMH of Acute Pancreatitis, Hypothyroidism, HLD, Type II Diabetes Mellitus, HTN, H Pylori, Depression, Anemia, DDD, and Fatty Liver Disease. She presented to [facility] ER via EMS on 09/28 with altered mental status and c/o "not feeling well." Upon arrival to the ER pt alert to self only. She was prescribed azithromycin and prednisone due to acute respiratory failure by her PCP on 09/26 while awaiting for final COVID-19 results. In the ER lab results ruled pt in for DKA and metabolic acidosis, therefore insulin gtt initiated. COVID-19 positive and CXR concerning for  multifocal pneumonia. CTA Chest negative for PE or acute aortic syndrome, but concerning for multifocal pneumonia. CT Head negative for acute intracranial findings. She was subsequently admitted to the stepdown unit per hospitalist team for additional workup and treatment, but remained in the ER pending bed availability. PCCM team consulted on 09/29 to assist with management of COVID pneumonia and acute metabolic encephalopathy.    54 yo female admitted with acute metabolic encephalopathy, acute metabolic acidosis, DKA, and COVID-19 pneumonia   SIGNIFICANT EVENTS/STUDIES:  09/28: CT Head revealed no acute intracranial findings. Hypoattenuating focus in the right basal ganglia compatible with remote lacunar infarct versus prominent perivascular space as detailed on comparison. 09/28: CTA Chest revealed no pulmonary embolus or acute aortic syndrome. Multifocal, peripheral predominant ground glass opacities, most consistent with multifocal pneumonia, including viral pneumonia. 09/29: Pt admitted to stepdown unit per hospitalist team, however remained in the ER pending bed availability 09/29: PCCM team consulted to assist with management of COVID pneumonia and acute metabolic encephalopathy 0/62: Patient is weaned to 35% on ventilator, weaned off sedation with mentation slowly waking up but not  following verbal communication yet. Will hold off on EEG for now due to SBT and awakening trial. Some concern for possible seizure activity overnight, we are monitoring closely and will perform EEG if any re-emergence of symptoms is noted.  02/01/20- patient is liberated from MV, she still is encephalopathic its unclear if she had central event. I discussed case with neurology and there is plan for repeat EEG.  10/3- patient got re-intubated overnight due to encephalopathy which seems to be chronic recurrent. I have discussed with husband most recent events and care plan 10/4- patient remains critically ill, neurologic evaluation on going 10/5- Worsening hypoxia when patient turned supine from prone, requiring 100% FiO2 and deep sedation/paralysis 10/6-not tolerating proning, looks miserable on the vent,agonal breathing 10/7severe resp failure, family updated, confrimed DNR status 10/9 severe ARDS 10/10 severe ARDS, severe shock, patient on multiple pressors 10/11 severe ARDS, Husband updated 10/12 severe ARDS fio2 at 95%     CC  follow up respiratory failure  SUBJECTIVE Patient remains critically ill Prognosis is guarded  Vent Mode: PRVC FiO2 (%):  [80 %-100 %] 100 % Set Rate:  [34 bmp] 34 bmp Vt Set:  [500 mL] 500 mL PEEP:  [12 cmH20-14 cmH20] 12 cmH20 Plateau Pressure:  [30 cmH20-33 cmH20] 30 cmH20  CBC    Component Value Date/Time   WBC 23.5 (H) 02/13/2020 0401   RBC 2.63 (L) 02/13/2020 0401   HGB 8.1 (L) 02/13/2020 0401   HCT 27.8 (L) 02/13/2020 0401   PLT 191 02/13/2020 0401   MCV 105.7 (H) 02/13/2020 0401   MCH 30.8 02/13/2020 0401   MCHC 29.1 (L) 02/13/2020 0401   RDW 14.9 02/13/2020 0401   LYMPHSABS 0.6 (L) 02/13/2020 0401   MONOABS 0.7 02/13/2020 0401   EOSABS 0.0 02/13/2020 0401  BASOSABS 0.1 02/13/2020 0401   BMP Latest Ref Rng & Units 02/13/2020 02/12/2020 02/11/2020  Glucose 70 - 99 mg/dL 171(H) 148(H) 77  BUN 6 - 20 mg/dL 118(H) 105(H) 116(H)   Creatinine 0.44 - 1.00 mg/dL 1.77(H) 1.65(H) 2.19(H)  Sodium 135 - 145 mmol/L 140 141 143  Potassium 3.5 - 5.1 mmol/L 4.7 3.9 5.1  Chloride 98 - 111 mmol/L 113(H) 117(H) 110  CO2 22 - 32 mmol/L 19(L) 20(L) 24  Calcium 8.9 - 10.3 mg/dL 7.6(L) 6.8(L) 9.4    BP 109/68   Pulse (!) 121   Temp 98.8 F (37.1 C) (Bladder)   Resp (!) 35   Ht 5' 2.99" (1.6 m)   Wt 115.8 kg   SpO2 95%   BMI 45.23 kg/m    I/O last 3 completed shifts: In: 14126.7 [I.V.:10399.2; NG/GT:1908.3; IV Piggyback:1819.1] Out: 975 [Urine:975] No intake/output data recorded.  SpO2: 95 % O2 Flow Rate (L/min): 15 L/min FiO2 (%): 100 %  Estimated body mass index is 45.23 kg/m as calculated from the following:   Height as of this encounter: 5' 2.99" (1.6 m).   Weight as of this encounter: 115.8 kg.  SIGNIFICANT EVENTS   REVIEW OF SYSTEMS  PATIENT IS UNABLE TO PROVIDE COMPLETE REVIEW OF SYSTEMS DUE TO SEVERE CRITICAL ILLNESS      COVID-19 DISASTER DECLARATION:   FULL CONTACT PHYSICAL EXAMINATION WAS NOT POSSIBLE DUE TO TREATMENT OF COVID-19  AND CONSERVATION OF PERSONAL PROTECTIVE EQUIPMENT, LIMITED EXAM FINDINGS INCLUDE-   PHYSICAL EXAMINATION:  GENERAL:critically ill appearing, +resp distress NEUROLOGIC: obtunded, GCS<8   Patient assessed or the symptoms described in the history of present illness.  In the context of the Global COVID-19 pandemic, which necessitated consideration that the patient might be at risk for infection with the SARS-CoV-2 virus that causes COVID-19, Institutional protocols and algorithms that pertain to the evaluation of patients at risk for COVID-19 are in a state of rapid change based on information released by regulatory bodies including the CDC and federal and state organizations. These policies and algorithms were followed during the patient's care while in hospital.    MEDICATIONS: I have reviewed all medications and confirmed regimen as documented   CULTURE RESULTS    Recent Results (from the past 240 hour(s))  CSF culture     Status: None   Collection Time: 02/05/20  2:19 PM   Specimen: PATH Cytology CSF; Cerebrospinal Fluid  Result Value Ref Range Status   Specimen Description   Final    CSF Performed at [facility], [address], [place], [place] [address]    Special Requests   Final    NONE Performed at [facility], [address] [place], [place] [address]    Gram Stain   Final    CYTOSPIN SLIDE WBC PRESENT, PREDOMINANTLY MONONUCLEAR NO ORGANISMS SEEN RED BLOOD CELLS PRESENT Performed at [facility], [address], [place], [place] [address]    Culture   Final    NO GROWTH 3 DAYS Performed at [facility], [place] [address] [place], [place] [address]    Report Status 02/09/2020 FINAL  Final  Culture, respiratory (non-expectorated)     Status: None   Collection Time: 02/10/20  3:15 AM   Specimen: Tracheal Aspirate; Respiratory  Result Value Ref Range Status   Specimen Description   Final    TRACHEAL ASPIRATE Performed at [facility], [address] [place], [place] [address]    Special Requests  Final    NONE Performed at [facility], [place], [place] [address]    Gram Stain   Final    RARE WBC PRESENT, PREDOMINANTLY PMN ABUNDANT GRAM POSITIVE RODS Performed at [facility], [place] [address], [place], [place] [address]    Culture ABUNDANT KLEBSIELLA PNEUMONIAE  Final   Report Status 02/12/2020 FINAL  Final   Organism ID, Bacteria KLEBSIELLA PNEUMONIAE  Final      Susceptibility   Klebsiella pneumoniae - MIC*    AMPICILLIN RESISTANT Resistant     CEFAZOLIN <=4 SENSITIVE Sensitive     CEFEPIME <=0.12 SENSITIVE Sensitive     CEFTAZIDIME <=1 SENSITIVE Sensitive     CEFTRIAXONE <=0.25 SENSITIVE Sensitive     CIPROFLOXACIN <=0.25 SENSITIVE Sensitive     GENTAMICIN <=1 SENSITIVE Sensitive     IMIPENEM <=0.25 SENSITIVE Sensitive      TRIMETH/SULFA <=20 SENSITIVE Sensitive     AMPICILLIN/SULBACTAM 8 SENSITIVE Sensitive     PIP/TAZO 8 SENSITIVE Sensitive     * ABUNDANT KLEBSIELLA PNEUMONIAE          Nutrition Status: Nutrition Problem: Inadequate oral intake Etiology: inability to eat Signs/Symptoms: NPO status Interventions: Tube feeding, Prostat, MVI     Indwelling Urinary Catheter continued, requirement due to   Reason to continue Indwelling Urinary Catheter strict Intake/Output monitoring for hemodynamic instability   Central Line/ continued, requirement due to  Reason to continue [place] of central venous pressure or other hemodynamic parameters and poor IV access   Ventilator continued, requirement due to severe respiratory failure   Ventilator Sedation RASS 0 to -2      ASSESSMENT AND PLAN SYNOPSIS Acute hypoxemic respiratory failure due to COVID-19 pneumonia / ARDS complicated by severe shock and sepsis with acute progressive renal failure  Patient with multiorgan failure- unable to prone due to high risk for cardiac arrest Patient is DNR  Severe ACUTE Hypoxic and Hypercapnic Respiratory Failure -continue Full MV support -continue Bronchodilator Therapy -Wean Fio2 and PEEP as tolerated -VAP/VENT bundle implementation    Morbid obesity, possible OSA.   Will certainly impact respiratory mechanics, ventilator weaning Suspect will need to consider additional PEEP   ACUTE KIDNEY INJURY/Renal Failure -continue Foley Catheter-assess need -Avoid nephrotoxic agents -Follow urine output, BMP -Ensure adequate renal perfusion, optimize oxygenation -Renal dose medications     NEUROLOGY Acute toxic metabolic encephalopathy, need for sedation Goal RASS -2 to -3  SHOCK-SEPSIS -use vasopressors to keep MAP>65   CARDIAC ICU monitoring  ID -continue IV abx as prescibed -follow up cultures  GI GI PROPHYLAXIS as indicated   DIET-->TF's as tolerated Constipation  protocol as indicated  ENDO - will use ICU hypoglycemic\Hyperglycemia protocol if indicated     ELECTROLYTES -follow labs as needed -replace as needed -pharmacy consultation and following   DVT/GI PRX ordered and assessed TRANSFUSIONS AS NEEDED MONITOR FSBS I Assessed the need for Labs I Assessed the need for Foley I Assessed the need for Central Venous Line Family Discussion when available I Assessed the need for Mobilization I made an Assessment of medications to be adjusted accordingly Safety Risk assessment completed   CASE DISCUSSED IN MULTIDISCIPLINARY ROUNDS WITH ICU TEAM  Critical Care Time devoted to patient care services described in this note is 45 minutes.   Overall, patient is critically ill, prognosis is guarded.  Patient with Multiorgan failure and at high risk for cardiac arrest and death.    Corrin Parker, M.D.  [facility]  Medical Director [place] Director [facility] Cardio-P[facility] Department

## 2020-02-13 NOTE — Progress Notes (Signed)
[facility] for Electrolyte Monitoring and Replacement   Recent Labs: Potassium (mmol/L)  Date Value  02/13/2020 4.7  01/17/2012 3.8   Magnesium (mg/dL)  Date Value  02/13/2020 2.8 (H)   Calcium (mg/dL)  Date Value  02/13/2020 7.6 (L)   Calcium, Total (mg/dL)  Date Value  01/17/2012 9.5   Albumin (g/dL)  Date Value  02/04/2020 2.4 (L)   Phosphorus (mg/dL)  Date Value  02/13/2020 5.7 (H)   Sodium (mmol/L)  Date Value  02/13/2020 140  01/17/2012 139    Assessment: 54 year old female with PMHx of anxiety, depression, DM, hypothyroidism, HLD admitted with COVID-19 PNA, DKA, acute metabolic encephalopathy. She was on an insulin infusion but has been transitioned to subcutaneous insulin.  Vital 1.5 @ trickle rate of 21ml/hr   Goal of Therapy:  Electrolytes WNL  Plan:  No electrolyte replacement warranted today. Follow labs per physician orders.   Tawnya Crook ,PharmD Clinical Pharmacist 02/13/2020 11:23 AM

## 2020-02-14 ENCOUNTER — Inpatient Hospital Stay: Payer: BC Managed Care – PPO

## 2020-02-14 DIAGNOSIS — G9341 Metabolic encephalopathy: Secondary | ICD-10-CM | POA: Diagnosis not present

## 2020-02-14 DIAGNOSIS — J9601 Acute respiratory failure with hypoxia: Secondary | ICD-10-CM | POA: Diagnosis not present

## 2020-02-14 DIAGNOSIS — U071 COVID-19: Secondary | ICD-10-CM | POA: Diagnosis not present

## 2020-02-14 LAB — RENAL FUNCTION PANEL
Albumin: 1.7 g/dL — ABNORMAL LOW (ref 3.5–5.0)
Anion gap: 10 (ref 5–15)
BUN: 143 mg/dL — ABNORMAL HIGH (ref 6–20)
CO2: 20 mmol/L — ABNORMAL LOW (ref 22–32)
Calcium: 8.7 mg/dL — ABNORMAL LOW (ref 8.9–10.3)
Chloride: 110 mmol/L (ref 98–111)
Creatinine, Ser: 1.95 mg/dL — ABNORMAL HIGH (ref 0.44–1.00)
GFR, Estimated: 29 mL/min — ABNORMAL LOW (ref >60)
Glucose, Bld: 126 mg/dL — ABNORMAL HIGH (ref 70–99)
Phosphorus: 6.7 mg/dL — ABNORMAL HIGH (ref 2.5–4.6)
Potassium: 5.5 mmol/L — ABNORMAL HIGH (ref 3.5–5.1)
Sodium: 140 mmol/L (ref 135–145)

## 2020-02-14 LAB — CBC WITH DIFFERENTIAL/PLATELET
Abs Immature Granulocytes: 1.18 10*3/uL — ABNORMAL HIGH (ref 0.00–0.07)
Basophils Absolute: 0.1 10*3/uL (ref 0.0–0.1)
Basophils Relative: 0 %
Eosinophils Absolute: 0 10*3/uL (ref 0.0–0.5)
Eosinophils Relative: 0 %
HCT: 31.5 % — ABNORMAL LOW (ref 36.0–46.0)
Hemoglobin: 9.5 g/dL — ABNORMAL LOW (ref 12.0–15.0)
Immature Granulocytes: 4 %
Lymphocytes Relative: 3 %
Lymphs Abs: 0.8 10*3/uL (ref 0.7–4.0)
MCH: 31 pg (ref 26.0–34.0)
MCHC: 30.2 g/dL (ref 30.0–36.0)
MCV: 102.9 fL — ABNORMAL HIGH (ref 80.0–100.0)
Monocytes Absolute: 1.3 10*3/uL — ABNORMAL HIGH (ref 0.1–1.0)
Monocytes Relative: 4 %
Neutro Abs: 25.2 10*3/uL — ABNORMAL HIGH (ref 1.7–7.7)
Neutrophils Relative %: 89 %
Platelets: 255 10*3/uL (ref 150–400)
RBC: 3.06 MIL/uL — ABNORMAL LOW (ref 3.87–5.11)
RDW: 15.1 % (ref 11.5–15.5)
Smear Review: NORMAL
WBC: 28.6 10*3/uL — ABNORMAL HIGH (ref 4.0–10.5)
nRBC: 1.5 % — ABNORMAL HIGH (ref 0.0–0.2)

## 2020-02-14 LAB — GLUCOSE, CAPILLARY
Glucose-Capillary: 134 mg/dL — ABNORMAL HIGH (ref 70–99)
Glucose-Capillary: 109 mg/dL — ABNORMAL HIGH (ref 70–99)
Glucose-Capillary: 129 mg/dL — ABNORMAL HIGH (ref 70–99)
Glucose-Capillary: 138 mg/dL — ABNORMAL HIGH (ref 70–99)
Glucose-Capillary: 132 mg/dL — ABNORMAL HIGH (ref 70–99)

## 2020-02-14 LAB — POTASSIUM: Potassium: 5.4 mmol/L — ABNORMAL HIGH (ref 3.5–5.1)

## 2020-02-14 LAB — VITAMIN B1: Vitamin B1 (Thiamine): 90 nmol/L (ref 66.5–200.0)

## 2020-02-14 LAB — MAGNESIUM: Magnesium: 3.2 mg/dL — ABNORMAL HIGH (ref 1.7–2.4)

## 2020-02-14 MED ORDER — PATIROMER SORBITEX CALCIUM 8.4 G PO PACK
16.8000 g | PACK | Freq: Every day | ORAL | Status: AC
  Administered 2020-02-14: 16.8 g via ORAL
  Filled 2020-02-14: qty 2

## 2020-02-14 MED ORDER — INSULIN REGULAR HUMAN 100 UNIT/ML IJ SOLN
10.0000 [IU] | Freq: Once | INTRAMUSCULAR | Status: AC
  Administered 2020-02-14: 10 [IU] via INTRAVENOUS
  Filled 2020-02-14: qty 10

## 2020-02-14 MED ORDER — DEXTROSE 50 % IV SOLN
1.0000 | Freq: Once | INTRAVENOUS | Status: AC
  Administered 2020-02-14: 50 mL via INTRAVENOUS
  Filled 2020-02-14: qty 50

## 2020-02-14 MED ORDER — SODIUM BICARBONATE 8.4 % IV SOLN
50.0000 meq | Freq: Once | INTRAVENOUS | Status: AC
  Administered 2020-02-14: 50 meq via INTRAVENOUS
  Filled 2020-02-14: qty 50

## 2020-02-14 MED FILL — Insulin Regular (Human) Inj 100 Unit/ML: INTRAMUSCULAR | Qty: 0.1 | Status: AC

## 2020-02-14 MED FILL — Insulin Regular (Human) Inj 100 Unit/ML: INTRAMUSCULAR | Qty: 0.1 | Status: CN

## 2020-02-14 NOTE — Progress Notes (Signed)
Daily Progress Note   Patient Name: Marissa Cook       Date: 02/14/2020 DOB: 1965-05-18  Age: 54 y.o. MRN#: [id number] Attending Physician: Flora Lipps, MD Primary Care Physician: Idelle Crouch, MD Admit Date: 01/12/2020  Reason for Consultation/Follow-up: Establishing goals of care  Subjective: Patient is resting in bed on ventilator. Patient's husband and daughter [name] are present. [name] voices and appears at peace with the plans for care moving forward and asks questions about comfort care and withdrawal of care. She understands her mother may not live until day of planned extubation 2/2 BP and need for pressors. She would like patient's dog to be brought in before transition to comfort. Patient's husband is at peace, and voices relief that his daughter is.    Length of Stay: 15  Current Medications: Scheduled Meds:  . vitamin C  500 mg Per Tube Daily  . chlorhexidine gluconate (MEDLINE KIT)  15 mL Mouth Rinse BID  . Chlorhexidine Gluconate Cloth  6 each Topical Daily  . cholecalciferol  1,000 Units Per Tube Daily  . docusate  100 mg Per Tube BID  . enoxaparin (LOVENOX) injection  40 mg Subcutaneous Q24H  . feeding supplement (PROSource TF)  90 mL Per Tube BID  . fentaNYL (SUBLIMAZE) injection  50 mcg Intravenous Once  . hydrocortisone sod succinate (SOLU-CORTEF) inj  50 mg Intravenous Q6H  . insulin aspart  0-15 Units Subcutaneous Q4H  . insulin aspart  10 Units Intravenous Once  . insulin aspart  10 Units Subcutaneous Q4H  . insulin detemir  20 Units Subcutaneous BID  . levothyroxine  300 mcg Per Tube Q0600  . mouth rinse  15 mL Mouth Rinse 10 times per day  . midodrine  10 mg Per Tube TID WC  . polyethylene glycol  17 g Per Tube Daily  . QUEtiapine  100 mg Per  Tube QHS  . sodium chloride flush  10-40 mL Intracatheter Q12H  . thiamine injection  100 mg Intravenous Daily  . vecuronium  10 mg Intravenous Once  . zinc sulfate  220 mg Per Tube Daily    Continuous Infusions: . sodium chloride 10 mL/hr at 02/08/20 0400  . cefTRIAXone (ROCEPHIN)  IV Stopped (02/13/20 1149)  . dextrose 50 mL/hr at 02/14/20 0800  . famotidine (PEPCID) IV Stopped (02/13/20  2146)  . feeding supplement (VITAL 1.5 CAL) 1,000 mL (02/14/20 0438)  . fentaNYL infusion INTRAVENOUS 125 mcg/hr (02/14/20 0800)  . midazolam 2 mg/hr (02/14/20 0800)  . norepinephrine (LEVOPHED) Adult infusion 3 mcg/min (02/14/20 0800)  . phenylephrine (NEO-SYNEPHRINE) Adult infusion 350 mcg/min (02/14/20 0800)  . vasopressin 0.04 Units/min (02/14/20 0800)    PRN Meds: acetaminophen, acetaminophen, bisacodyl, chlorpheniramine-HYDROcodone, fentaNYL, guaiFENesin-dextromethorphan, ibuprofen, magnesium hydroxide, midazolam, ondansetron **OR** ondansetron (ZOFRAN) IV, polyethylene glycol, sodium chloride flush, vecuronium  Physical Exam Constitutional:      Comments: On ventilator.              Vital Signs: BP 124/79   Pulse (!) 118   Temp 98 F (36.7 C) (Bladder)   Resp (!) 37   Ht 5' 2.99" (1.6 m)   Wt 116.2 kg   SpO2 99%   BMI 45.39 kg/m  SpO2: SpO2: 99 % O2 Device: O2 Device: Ventilator O2 Flow Rate: O2 Flow Rate (L/min): 15 L/min  Intake/output summary:   Intake/Output Summary (Last 24 hours) at 02/14/2020 9449 Last data filed at 02/14/2020 0800 Gross per 24 hour  Intake 3950.81 ml  Output 1220 ml  Net 2730.81 ml   LBM: Last BM Date: 02/14/20 Baseline Weight: Weight: 72.6 kg Most recent weight: Weight: 116.2 kg       Palliative Assessment/Data:      Patient Active Problem List   Diagnosis Date Noted  . Acute respiratory failure ([place])   . Altered mental status   . DKA (diabetic ketoacidoses) 01/30/2020  . Acute metabolic encephalopathy 67/59/1638  . Acute  hypoxemic respiratory failure due to COVID-19 ([facility]) 01/30/2020  . Pressure injury of skin 01/30/2020  . Hypothyroidism   . Depression   . COVID-19   . Pneumonia due to COVID-19 virus   . Spondylolisthesis, lumbar region 02/21/2019    Palliative Care Assessment & Plan    Recommendations/Plan:  Family would not want a tracheostomy or dialysis. No CPR. Plans for shift to comfort care on 21st day, Monday 10/18.  Code Status:    Code Status Orders  (From admission, onward)         Start     Ordered   02/06/20 1701  Do not attempt resuscitation (DNR)  Continuous       Question Answer Comment  In the event of cardiac or respiratory ARREST Do not call a "code blue"   In the event of cardiac or respiratory ARREST Do not perform Intubation, CPR, defibrillation or ACLS   In the event of cardiac or respiratory ARREST Use medication by any route, position, wound care, and other measures to relive pain and suffering. May use oxygen, suction and manual treatment of airway obstruction as needed for comfort.      02/06/20 1700        Code Status History    Date Active Date Inactive Code Status Order ID Comments User Context   01/30/2020 1508 02/06/2020 1700 Full Code [id number]  Ottie Glazier, MD ED   01/30/2020 0508 01/30/2020 1508 Full Code [id number]  Mansy, Arvella Merles, MD ED   02/21/2019 1313 02/22/2019 1405 Full Code [id number]  Newman Pies, MD Inpatient   Advance Care Planning Activity       Prognosis:   Very poor.    Care plan was discussed with CCM and RN  Thank you for allowing the Palliative Medicine Team to assist in the care of this patient.   Total Time 15 min Prolonged Time Billed  no  Greater than 50%  of this time was spent counseling and coordinating care related to the above assessment and plan.  Cathryn Gallery, NP  Please contact Palliative Medicine Team phone at [phone] for questions and concerns.      

## 2020-02-14 NOTE — Progress Notes (Signed)
CRITICAL CARE NOTE 54 yo female with a PMH of Acute Pancreatitis, Hypothyroidism, HLD, Type II Diabetes Mellitus, HTN, H Pylori, Depression, Anemia, DDD, and Fatty Liver Disease. She presented to [facility] ER via EMS on 09/28 with altered mental status and c/o "not feeling well." Upon arrival to the ER pt alert to self only. She was prescribed azithromycin and prednisone due to acute respiratory failure by her PCP on 09/26 while awaiting for final COVID-19 results. In the ER lab results ruled pt in for DKA and metabolic acidosis, therefore insulin gtt initiated. COVID-19 positive and CXR concerning for  multifocal pneumonia. CTA Chest negative for PE or acute aortic syndrome, but concerning for multifocal pneumonia. CT Head negative for acute intracranial findings. She was subsequently admitted to the stepdown unit per hospitalist team for additional workup and treatment, but remained in the ER pending bed availability. PCCM team consulted on 09/29 to assist with management of COVID pneumonia and acute metabolic encephalopathy.    54 yo female admitted with acute metabolic encephalopathy, acute metabolic acidosis, DKA, and COVID-19 pneumonia   SIGNIFICANT EVENTS/STUDIES:  09/28: CT Head revealed no acute intracranial findings. Hypoattenuating focus in the right basal ganglia compatible with remote lacunar infarct versus prominent perivascular space as detailed on comparison. 09/28: CTA Chest revealed no pulmonary embolus or acute aortic syndrome. Multifocal, peripheral predominant ground glass opacities, most consistent with multifocal pneumonia, including viral pneumonia. 09/29: Pt admitted to stepdown unit per hospitalist team, however remained in the ER pending bed availability 09/29: PCCM team consulted to assist with management of COVID pneumonia and acute metabolic encephalopathy 7/12: Patient is weaned to 35% on ventilator, weaned off sedation with mentation slowly waking up but not  following verbal communication yet. Will hold off on EEG for now due to SBT and awakening trial. Some concern for possible seizure activity overnight, we are monitoring closely and will perform EEG if any re-emergence of symptoms is noted.  02/01/20- patient is liberated from MV, she still is encephalopathic its unclear if she had central event. I discussed case with neurology and there is plan for repeat EEG.  10/3- patient got re-intubated overnight due to encephalopathy which seems to be chronic recurrent. I have discussed with husband most recent events and care plan 10/4- patient remains critically ill, neurologic evaluation on going 10/5- Worsening hypoxia when patient turned supine from prone, requiring 100% FiO2 and deep sedation/paralysis 10/6-not tolerating proning, looks miserable on the vent,agonal breathing 10/7severe resp failure, family updated, confrimed DNR status 10/9 severe ARDS 10/10 severe ARDS, severe shock, patient on multiple pressors 10/11 severe ARDS, Husband updated 10/12 severe ARDS fio2 at 95% 10/14 severe hypoxia and severe shock, patient is in dying process    CC  follow up respiratory failure  SUBJECTIVE Patient remains critically ill Prognosis is guarded Poor prognosis Patient is suffering and in dying process  Multiorgan failure  Vent Mode: PRVC FiO2 (%):  [100 %] 100 % Set Rate:  [34 bmp] 34 bmp Vt Set:  [500 mL] 500 mL PEEP:  [12 cmH20] 12 cmH20 Plateau Pressure:  [30 cmH20] 30 cmH20 CBC    Component Value Date/Time   WBC 28.6 (H) 02/14/2020 0410   RBC 3.06 (L) 02/14/2020 0410   HGB 9.5 (L) 02/14/2020 0410   HCT 31.5 (L) 02/14/2020 0410   PLT 255 02/14/2020 0410   MCV 102.9 (H) 02/14/2020 0410   MCH 31.0 02/14/2020 0410   MCHC 30.2 02/14/2020 0410   RDW 15.1 02/14/2020 0410   LYMPHSABS 0.8  02/14/2020 0410   MONOABS 1.3 (H) 02/14/2020 0410   EOSABS 0.0 02/14/2020 0410   BASOSABS 0.1 02/14/2020 0410   BMP Latest Ref Rng & Units  02/14/2020 02/13/2020 02/12/2020  Glucose 70 - 99 mg/dL 126(H) 171(H) 148(H)  BUN 6 - 20 mg/dL 143(H) 118(H) 105(H)  Creatinine 0.44 - 1.00 mg/dL 1.95(H) 1.77(H) 1.65(H)  Sodium 135 - 145 mmol/L 140 140 141  Potassium 3.5 - 5.1 mmol/L 5.5(H) 4.7 3.9  Chloride 98 - 111 mmol/L 110 113(H) 117(H)  CO2 22 - 32 mmol/L 20(L) 19(L) 20(L)  Calcium 8.9 - 10.3 mg/dL 8.7(L) 7.6(L) 6.8(L)      BP 120/74   Pulse (!) 116   Temp 98.8 F (37.1 C) (Bladder)   Resp (!) 36   Ht 5' 2.99" (1.6 m)   Wt 116.2 kg   SpO2 99%   BMI 45.39 kg/m    I/O last 3 completed shifts: In: 5689.3 [I.V.:4866; NG/GT:523.3; IV Piggyback:300] Out: 1400 [Urine:1200; Stool:200] No intake/output data recorded.  SpO2: 99 % O2 Flow Rate (L/min): 15 L/min FiO2 (%): 100 %  Estimated body mass index is 45.39 kg/m as calculated from the following:   Height as of this encounter: 5' 2.99" (1.6 m).   Weight as of this encounter: 116.2 kg.  SIGNIFICANT EVENTS   REVIEW OF SYSTEMS  PATIENT IS UNABLE TO PROVIDE COMPLETE REVIEW OF SYSTEMS DUE TO SEVERE CRITICAL ILLNESS      COVID-19 DISASTER DECLARATION:   FULL CONTACT PHYSICAL EXAMINATION WAS NOT POSSIBLE DUE TO TREATMENT OF COVID-19  AND CONSERVATION OF PERSONAL PROTECTIVE EQUIPMENT, LIMITED EXAM FINDINGS INCLUDE-   PHYSICAL EXAMINATION:  GENERAL:critically ill appearing, +resp distress NEUROLOGIC: obtunded, GCS<8   Patient assessed or the symptoms described in the history of present illness.  In the context of the Global COVID-19 pandemic, which necessitated consideration that the patient might be at risk for infection with the SARS-CoV-2 virus that causes COVID-19, Institutional protocols and algorithms that pertain to the evaluation of patients at risk for COVID-19 are in a state of rapid change based on information released by regulatory bodies including the CDC and federal and state organizations. These policies and algorithms were followed during the  patient's care while in hospital.    MEDICATIONS: I have reviewed all medications and confirmed regimen as documented   CULTURE RESULTS   Recent Results (from the past 240 hour(s))  CSF culture     Status: None   Collection Time: 02/05/20  2:19 PM   Specimen: PATH Cytology CSF; Cerebrospinal Fluid  Result Value Ref Range Status   Specimen Description   Final    CSF Performed at [facility], [address] [place], [place] [address]    Special Requests   Final    NONE Performed at [facility], [address] [place], [place] [address]    Gram Stain   Final    CYTOSPIN SLIDE WBC PRESENT, PREDOMINANTLY MONONUCLEAR NO ORGANISMS SEEN RED BLOOD CELLS PRESENT Performed at [facility], [address] [place], [place] [address]    Culture   Final    NO GROWTH 3 DAYS Performed at [facility], [place] [address] [place], [place] [address]    Report Status 02/09/2020 FINAL  Final  Culture, respiratory (non-expectorated)     Status: None   Collection Time: 02/10/20  3:15 AM   Specimen: Tracheal Aspirate; Respiratory  Result Value Ref Range Status   Specimen Description   Final    TRACHEAL  ASPIRATE Performed at [facility], [address] [place], [place] [address]    Special Requests   Final    NONE Performed at [facility], [place], [place] [address]    Gram Stain   Final    RARE WBC PRESENT, PREDOMINANTLY PMN ABUNDANT GRAM POSITIVE RODS Performed at [facility], [place] [address], [place], [place] [address]    Culture ABUNDANT KLEBSIELLA PNEUMONIAE  Final   Report Status 02/12/2020 FINAL  Final   Organism ID, Bacteria KLEBSIELLA PNEUMONIAE  Final      Susceptibility   Klebsiella pneumoniae - MIC*    AMPICILLIN RESISTANT Resistant     CEFAZOLIN <=4 SENSITIVE Sensitive     CEFEPIME <=0.12 SENSITIVE Sensitive     CEFTAZIDIME <=1 SENSITIVE Sensitive     CEFTRIAXONE <=0.25 SENSITIVE  Sensitive     CIPROFLOXACIN <=0.25 SENSITIVE Sensitive     GENTAMICIN <=1 SENSITIVE Sensitive     IMIPENEM <=0.25 SENSITIVE Sensitive     TRIMETH/SULFA <=20 SENSITIVE Sensitive     AMPICILLIN/SULBACTAM 8 SENSITIVE Sensitive     PIP/TAZO 8 SENSITIVE Sensitive     * ABUNDANT KLEBSIELLA PNEUMONIAE          IMAGING    No results found.   Nutrition Status: Nutrition Problem: Inadequate oral intake Etiology: inability to eat Signs/Symptoms: NPO status Interventions: Tube feeding, Prostat, MVI     Indwelling Urinary Catheter continued, requirement due to   Reason to continue Indwelling Urinary Catheter strict Intake/Output monitoring for hemodynamic instability   Central Line/ continued, requirement due to  Reason to continue [place] of central venous pressure or other hemodynamic parameters and poor IV access   Ventilator continued, requirement due to severe respiratory failure   Ventilator Sedation RASS 0 to -2      ASSESSMENT AND PLAN SYNOPSIS  Acute hypoxemic respiratory failure due to COVID-19 pneumonia / ARDS complicated by severe shock and sepsis with acute progressive renal failure  Patient with multiorgan failure-unable to prone due to high risk for cardiac arrest Patient is DNR, patient unable to prone due to high risk for death     Severe ACUTE Hypoxic and Hypercapnic Respiratory Failure -continue Full MV support -continue Bronchodilator Therapy -Wean Fio2 and PEEP as tolerated -VAP/VENT bundle implementation   Morbid obesity, possible OSA.   Will certainly impact respiratory mechanics, ventilator weaning  ACUTE KIDNEY INJURY/Renal Failure -continue Foley Catheter-assess need -Avoid nephrotoxic agents -Follow urine output, BMP -Ensure adequate renal perfusion, optimize oxygenation -Renal dose medications     NEUROLOGY Acute toxic metabolic encephalopathy, need for sedation Goal RASS -2 to -3  SHOCK-SEPSIS -use  vasopressors to keep MAP>65  CARDIAC ICU monitoring  ID -continue IV abx as prescibed -follow up cultures  GI GI PROPHYLAXIS as indicated   DIET-->TF's as tolerated Constipation protocol as indicated  ENDO - will use ICU hypoglycemic\Hyperglycemia protocol if indicated     ELECTROLYTES -follow labs as needed -replace as needed -pharmacy consultation and following   DVT/GI PRX ordered and assessed TRANSFUSIONS AS NEEDED MONITOR FSBS I Assessed the need for Labs I Assessed the need for Foley I Assessed the need for Central Venous Line Family Discussion when available I Assessed the need for Mobilization I made an Assessment of medications to be adjusted accordingly Safety Risk assessment completed   CASE DISCUSSED IN MULTIDISCIPLINARY ROUNDS WITH ICU TEAM  Critical Care Time devoted to patient care services described in this note is 75 minutes.   Overall,  patient is critically ill, prognosis is guarded.  Patient with Multiorgan failure and at high risk for cardiac arrest and death.    Corrin Parker, M.D.  [facility]  Medical Director [facility] Cardio-Pulmonary Department

## 2020-02-14 NOTE — Progress Notes (Signed)
CH encountered husband and dtr. standing at pt.'s door in ICU; they shared that they plan on going into rm. later this afternoon but know that they must leave hospital after entering.  Plan is to extubate pt. on Monday when she can no longer be on vent; husband shared, 'She's a fighter --we're hoping she'll surprise everyone and breathe on her own when they take that tube out... We're praying that Monday will be a good day --but if it's the Lord's time to take her, then we'll deal with that.'  Husband reflected on how much more difficult it has been to see his young wife so sick and in danger of dying, compared to being in a similar situation with his elderly father this summer before father died.  [facility] remains available as needed; consult for chaplain support Monday is requested by family.

## 2020-02-14 NOTE — Progress Notes (Signed)
CDS referral number= [id number] c/o Ronney Asters CDS representative.

## 2020-02-14 NOTE — Progress Notes (Signed)
[facility] for Electrolyte Monitoring and Replacement   Recent Labs: Potassium (mmol/L)  Date Value  02/14/2020 5.5 (H)  01/17/2012 3.8   Magnesium (mg/dL)  Date Value  02/14/2020 3.2 (H)   Calcium (mg/dL)  Date Value  02/14/2020 8.7 (L)   Calcium, Total (mg/dL)  Date Value  01/17/2012 9.5   Albumin (g/dL)  Date Value  02/14/2020 1.7 (L)   Phosphorus (mg/dL)  Date Value  02/14/2020 6.7 (H)   Sodium (mmol/L)  Date Value  02/14/2020 140  01/17/2012 139    Assessment: 54 year old female with PMHx of anxiety, depression, DM, hypothyroidism, HLD admitted with COVID-19 PNA, DKA, acute metabolic encephalopathy. She was on an insulin infusion but has been transitioned to subcutaneous insulin.  Vital 1.5 @ trickle rate of 28ml/hr   Goal of Therapy:  Electrolytes WNL  Plan:  No electrolyte replacement warranted today. Follow labs per physician orders.   Tawnya Crook ,PharmD Clinical Pharmacist 02/14/2020 11:26 AM

## 2020-02-15 ENCOUNTER — Inpatient Hospital Stay: Payer: BC Managed Care – PPO

## 2020-02-15 LAB — GLUCOSE, CAPILLARY
Glucose-Capillary: 139 mg/dL — ABNORMAL HIGH (ref 70–99)
Glucose-Capillary: 124 mg/dL — ABNORMAL HIGH (ref 70–99)
Glucose-Capillary: 144 mg/dL — ABNORMAL HIGH (ref 70–99)
Glucose-Capillary: 125 mg/dL — ABNORMAL HIGH (ref 70–99)
Glucose-Capillary: 104 mg/dL — ABNORMAL HIGH (ref 70–99)
Glucose-Capillary: 108 mg/dL — ABNORMAL HIGH (ref 70–99)
Glucose-Capillary: 104 mg/dL — ABNORMAL HIGH (ref 70–99)

## 2020-02-15 LAB — CBC WITH DIFFERENTIAL/PLATELET
Abs Immature Granulocytes: 1.19 10*3/uL — ABNORMAL HIGH (ref 0.00–0.07)
Basophils Absolute: 0.1 10*3/uL (ref 0.0–0.1)
Basophils Relative: 0 %
Eosinophils Absolute: 0 10*3/uL (ref 0.0–0.5)
Eosinophils Relative: 0 %
HCT: 31.9 % — ABNORMAL LOW (ref 36.0–46.0)
Hemoglobin: 9.2 g/dL — ABNORMAL LOW (ref 12.0–15.0)
Immature Granulocytes: 4 %
Lymphocytes Relative: 3 %
Lymphs Abs: 0.8 10*3/uL (ref 0.7–4.0)
MCH: 30.3 pg (ref 26.0–34.0)
MCHC: 28.8 g/dL — ABNORMAL LOW (ref 30.0–36.0)
MCV: 104.9 fL — ABNORMAL HIGH (ref 80.0–100.0)
Monocytes Absolute: 1 10*3/uL (ref 0.1–1.0)
Monocytes Relative: 3 %
Neutro Abs: 28.8 10*3/uL — ABNORMAL HIGH (ref 1.7–7.7)
Neutrophils Relative %: 90 %
Platelets: 241 10*3/uL (ref 150–400)
RBC: 3.04 MIL/uL — ABNORMAL LOW (ref 3.87–5.11)
RDW: 15.2 % (ref 11.5–15.5)
Smear Review: NORMAL
WBC: 32 10*3/uL — ABNORMAL HIGH (ref 4.0–10.5)
nRBC: 2.1 % — ABNORMAL HIGH (ref 0.0–0.2)

## 2020-02-15 LAB — RENAL FUNCTION PANEL
Albumin: 1.8 g/dL — ABNORMAL LOW (ref 3.5–5.0)
Anion gap: 10 (ref 5–15)
BUN: 140 mg/dL — ABNORMAL HIGH (ref 6–20)
CO2: 20 mmol/L — ABNORMAL LOW (ref 22–32)
Calcium: 8.6 mg/dL — ABNORMAL LOW (ref 8.9–10.3)
Chloride: 107 mmol/L (ref 98–111)
Creatinine, Ser: 1.94 mg/dL — ABNORMAL HIGH (ref 0.44–1.00)
GFR, Estimated: 29 mL/min — ABNORMAL LOW (ref >60)
Glucose, Bld: 133 mg/dL — ABNORMAL HIGH (ref 70–99)
Phosphorus: 7 mg/dL — ABNORMAL HIGH (ref 2.5–4.6)
Potassium: 5.7 mmol/L — ABNORMAL HIGH (ref 3.5–5.1)
Sodium: 137 mmol/L (ref 135–145)

## 2020-02-15 LAB — LACTIC ACID, PLASMA
Lactic Acid, Venous: 1.4 mmol/L (ref 0.5–1.9)
Lactic Acid, Venous: 1.3 mmol/L (ref 0.5–1.9)

## 2020-02-15 LAB — FIBRINOGEN: Fibrinogen: 692 mg/dL — ABNORMAL HIGH (ref 210–475)

## 2020-02-15 LAB — MAGNESIUM: Magnesium: 3.1 mg/dL — ABNORMAL HIGH (ref 1.7–2.4)

## 2020-02-15 LAB — POTASSIUM: Potassium: 5.4 mmol/L — ABNORMAL HIGH (ref 3.5–5.1)

## 2020-02-15 MED ORDER — INSULIN ASPART 100 UNIT/ML ~~LOC~~ SOLN
10.0000 [IU] | Freq: Once | SUBCUTANEOUS | Status: AC
  Administered 2020-02-15: 10 [IU] via INTRAVENOUS
  Filled 2020-02-15: qty 1
  Filled 2020-02-15: qty 0.1

## 2020-02-15 MED ORDER — IBUPROFEN 100 MG/5ML PO SUSP
400.0000 mg | Freq: Four times a day (QID) | ORAL | Status: DC | PRN
  Filled 2020-02-15 (×2): qty 20

## 2020-02-15 MED ORDER — FUROSEMIDE 10 MG/ML IJ SOLN
40.0000 mg | Freq: Two times a day (BID) | INTRAMUSCULAR | Status: DC
  Administered 2020-02-15 – 2020-02-16 (×2): 40 mg via INTRAVENOUS
  Filled 2020-02-15 (×2): qty 4

## 2020-02-15 MED ORDER — FUROSEMIDE 10 MG/ML IJ SOLN
40.0000 mg | Freq: Every day | INTRAMUSCULAR | Status: DC
  Administered 2020-02-15: 40 mg via INTRAVENOUS
  Filled 2020-02-15: qty 4

## 2020-02-15 MED ORDER — PATIROMER SORBITEX CALCIUM 8.4 G PO PACK
25.2000 g | PACK | Freq: Every day | ORAL | Status: DC
  Filled 2020-02-15: qty 3

## 2020-02-15 MED ORDER — SODIUM BICARBONATE 8.4 % IV SOLN
50.0000 meq | Freq: Once | INTRAVENOUS | Status: AC
  Administered 2020-02-15: 50 meq via INTRAVENOUS
  Filled 2020-02-15: qty 50

## 2020-02-15 MED ORDER — DEXTROSE 50 % IV SOLN
1.0000 | Freq: Once | INTRAVENOUS | Status: AC
  Administered 2020-02-15: 50 mL via INTRAVENOUS
  Filled 2020-02-15: qty 50

## 2020-02-15 NOTE — Progress Notes (Addendum)
CRITICAL CARE NOTE 54 yo female with a PMH of Acute Pancreatitis, Hypothyroidism, HLD, Type II Diabetes Mellitus, HTN, H Pylori, Depression, Anemia, DDD, and Fatty Liver Disease. She presented to [facility] ER via EMS on 09/28 with altered mental status and c/o "not feeling well." Upon arrival to the ER pt alert to self only. She was prescribed azithromycin and prednisone due to acute respiratory failure by her PCP on 09/26 while awaiting for final COVID-19 results. In the ER lab results ruled pt in for DKA and metabolic acidosis, therefore insulin gtt initiated. COVID-19 positive and CXR concerning for  multifocal pneumonia. CTA Chest negative for PE or acute aortic syndrome, but concerning for multifocal pneumonia. CT Head negative for acute intracranial findings. She was subsequently admitted to the stepdown unit per hospitalist team for additional workup and treatment, but remained in the ER pending bed availability. PCCM team consulted on 09/29 to assist with management of COVID pneumonia and acute metabolic encephalopathy.    54 yo female admitted with acute metabolic encephalopathy, acute metabolic acidosis, DKA, and COVID-19 pneumonia   SIGNIFICANT EVENTS/STUDIES:  09/28: CT Head revealed no acute intracranial findings. Hypoattenuating focus in the right basal ganglia compatible with remote lacunar infarct versus prominent perivascular space as detailed on comparison. 09/28: CTA Chest revealed no pulmonary embolus or acute aortic syndrome. Multifocal, peripheral predominant ground glass opacities, most consistent with multifocal pneumonia, including viral pneumonia. 09/29: Pt admitted to stepdown unit per hospitalist team, however remained in the ER pending bed availability 09/29: PCCM team consulted to assist with management of COVID pneumonia and acute metabolic encephalopathy 6/43: Patient is weaned to 35% on ventilator, weaned off sedation with mentation slowly waking up but not  following verbal communication yet. Will hold off on EEG for now due to SBT and awakening trial. Some concern for possible seizure activity overnight, we are monitoring closely and will perform EEG if any re-emergence of symptoms is noted.  02/01/20- patient is liberated from MV, she still is encephalopathic its unclear if she had central event. I discussed case with neurology and there is plan for repeat EEG.  10/3- patient got re-intubated overnight due to encephalopathy which seems to be chronic recurrent. I have discussed with husband most recent events and care plan 10/4- patient remains critically ill, neurologic evaluation on going 10/5- Worsening hypoxia when patient turned supine from prone, requiring 100% FiO2 and deep sedation/paralysis 10/6-not tolerating proning, looks miserable on the vent,agonal breathing 10/7severe resp failure, family updated, confrimed DNR status 10/9 severe ARDS 10/10 severe ARDS, severe shock, patient on multiple pressors 10/11 severe ARDS, Husband updated 10/12 severe ARDS fio2 at 95% 10/14 severe hypoxia and severe shock, patient is in dying process 02/15/20- unable to tolerate proning, currently on 90%FIO2 DNR status.  She is 32L+. Lasix challenge today with >1L urine thus far on day shift and with vitals in reference range on pressors. Called husband and was unable to leave message on cell phone "Voice mail not set up yet"    CC  follow up respiratory failure  SUBJECTIVE Patient remains critically ill Prognosis is guarded Poor prognosis Patient is suffering and in dying process  Multiorgan failure  Vent Mode: PRVC FiO2 (%):  [75 %-85 %] 85 % Set Rate:  [34 bmp] 34 bmp Vt Set:  [500 mL] 500 mL PEEP:  [12 cmH20] 12 cmH20 CBC    Component Value Date/Time   WBC 32.0 (H) 02/15/2020 0300   RBC 3.04 (L) 02/15/2020 0300   HGB 9.2 (L)  02/15/2020 0300   HCT 31.9 (L) 02/15/2020 0300   PLT 241 02/15/2020 0300   MCV 104.9 (H) 02/15/2020 0300    MCH 30.3 02/15/2020 0300   MCHC 28.8 (L) 02/15/2020 0300   RDW 15.2 02/15/2020 0300   LYMPHSABS 0.8 02/15/2020 0300   MONOABS 1.0 02/15/2020 0300   EOSABS 0.0 02/15/2020 0300   BASOSABS 0.1 02/15/2020 0300   BMP Latest Ref Rng & Units 02/15/2020 02/14/2020 02/14/2020  Glucose 70 - 99 mg/dL 133(H) - 126(H)  BUN 6 - 20 mg/dL 140(H) - 143(H)  Creatinine 0.44 - 1.00 mg/dL 1.94(H) - 1.95(H)  Sodium 135 - 145 mmol/L 137 - 140  Potassium 3.5 - 5.1 mmol/L 5.7(H) 5.4(H) 5.5(H)  Chloride 98 - 111 mmol/L 107 - 110  CO2 22 - 32 mmol/L 20(L) - 20(L)  Calcium 8.9 - 10.3 mg/dL 8.6(L) - 8.7(L)      BP 110/71 (BP Location: Left Arm)   Pulse (!) 109   Temp 98.6 F (37 C) (Bladder)   Resp (!) 34   Ht 5' 2.99" (1.6 m)   Wt 117.2 kg   SpO2 94%   BMI 45.78 kg/m    I/O last 3 completed shifts: In: 5884.3 [I.V.:5014.2; NG/GT:620; IV Piggyback:250.1] Out: 0932 [Urine:1625; Emesis/NG output:1200; Stool:210] No intake/output data recorded.  SpO2: 94 % O2 Flow Rate (L/min): 15 L/min FiO2 (%): 85 %  Estimated body mass index is 45.78 kg/m as calculated from the following:   Height as of this encounter: 5' 2.99" (1.6 m).   Weight as of this encounter: 117.2 kg.  SIGNIFICANT EVENTS   REVIEW OF SYSTEMS  PATIENT IS UNABLE TO PROVIDE COMPLETE REVIEW OF SYSTEMS DUE TO SEVERE CRITICAL ILLNESS       PHYSICAL EXAMINATION:  GENERAL:critically ill appearing, +Anasarca  NEUROLOGIC: obtunded, GCS<8 HEENT - scleral edema+ Respiratory - +crackles and rhonchi Cardiovascular- NSR GI - distended no BSx4 Musculoskeletal - 3+edema x 4 Integumentary - intact grossly   MEDICATIONS: I have reviewed all medications and confirmed regimen as documented   CULTURE RESULTS   Recent Results (from the past 240 hour(s))  CSF culture     Status: None   Collection Time: 02/05/20  2:19 PM   Specimen: PATH Cytology CSF; Cerebrospinal Fluid  Result Value Ref Range Status   Specimen Description   Final     CSF Performed at [facility], [address], [place], [place] [address]    Special Requests   Final    NONE Performed at [facility], [address] [place], [place] [address]    Gram Stain   Final    CYTOSPIN SLIDE WBC PRESENT, PREDOMINANTLY MONONUCLEAR NO ORGANISMS SEEN RED BLOOD CELLS PRESENT Performed at [facility], [address] [place], [place] [address]    Culture   Final    NO GROWTH 3 DAYS Performed at [facility], [address], [place], [place] [address]    Report Status 02/09/2020 FINAL  Final  Culture, respiratory (non-expectorated)     Status: None   Collection Time: 02/10/20  3:15 AM   Specimen: Tracheal Aspirate; Respiratory  Result Value Ref Range Status   Specimen Description   Final    TRACHEAL ASPIRATE Performed at [facility], [address] [place], [place] [address]    Special Requests   Final    NONE Performed at [facility], [address] [place],  [address]    Gram Stain   Final  RARE WBC PRESENT, PREDOMINANTLY PMN ABUNDANT GRAM POSITIVE RODS Performed at [facility] [address], [place], [place] [address]    Culture ABUNDANT KLEBSIELLA PNEUMONIAE  Final   Report Status 02/12/2020 FINAL  Final   Organism ID, Bacteria KLEBSIELLA PNEUMONIAE  Final      Susceptibility   Klebsiella pneumoniae - MIC*    AMPICILLIN RESISTANT Resistant     CEFAZOLIN <=4 SENSITIVE Sensitive     CEFEPIME <=0.12 SENSITIVE Sensitive     CEFTAZIDIME <=1 SENSITIVE Sensitive     CEFTRIAXONE <=0.25 SENSITIVE Sensitive     CIPROFLOXACIN <=0.25 SENSITIVE Sensitive     GENTAMICIN <=1 SENSITIVE Sensitive     IMIPENEM <=0.25 SENSITIVE Sensitive     TRIMETH/SULFA <=20 SENSITIVE Sensitive     AMPICILLIN/SULBACTAM 8 SENSITIVE Sensitive     PIP/TAZO 8 SENSITIVE Sensitive     * ABUNDANT KLEBSIELLA PNEUMONIAE          IMAGING    No results found.   Nutrition Status: Nutrition  Problem: Inadequate oral intake Etiology: inability to eat Signs/Symptoms: NPO status Interventions: Tube feeding, Prostat, MVI     Indwelling Urinary Catheter continued, requirement due to   Reason to continue Indwelling Urinary Catheter strict Intake/Output monitoring for hemodynamic instability   Central Line/ continued, requirement due to  Reason to continue [place] of central venous pressure or other hemodynamic parameters and poor IV access   Ventilator continued, requirement due to severe respiratory failure   Ventilator Sedation RASS 0 to -2      ASSESSMENT AND PLAN SYNOPSIS   Acute hypoxic respiratory failure secondary to COVID-19 pneumonia and acute metabolic acidosis  Severe ARDS - DNR status - complex situation with onset of anasarca  Prn supplemental O2 to maintain O2 sats 88% or higher Continue remdesivir, vitamins, and iv steroids Trend inflammatory markers  Encourage self-proning as tolerated  Prn antitussives  Pulmonary hygiene    Diabetic Ketoacidosis -resolved  Severe metabolic acidosis in the setting of DKA  Continuous telemetry monitoring    AKI stage 3 KDIGO  - with anasarca   - family wishes to not have CRRT  - will perform lasix trial today   - UOP >1L today - 10/15  Altered mental status with encephalopathy  -neurology on case - appreciate input  - acyclovir started  - possible LP   - thyroid workup  - patient on seroquel from home  - reviewed plan with neurologist - plan for EEG-Srishti Bhagat MD-PhD [facility]             -continue acyclovir -MRI brain w/ and w/o contrast if patient medically stable enough for this study  -HSV PCR testing added on the CSF via [facility]    Hypothyroidism  TSH - patient on synthroid 300 per neurology    Best Practice VTE px: subq lovenox SUP px: pepcid  Code status: Full code   CARDIAC ICU monitoring  ID -continue IV abx as prescibed -follow up  cultures  GI GI PROPHYLAXIS as indicated   DIET-->TF's as tolerated Constipation protocol as indicated  ENDO - will use ICU hypoglycemic\Hyperglycemia protocol if indicated     ELECTROLYTES -follow labs as needed -replace as needed -pharmacy consultation and following   DVT/GI PRX ordered and assessed TRANSFUSIONS AS NEEDED MONITOR FSBS I Assessed the need for Labs I Assessed the need for Foley I Assessed the need for Central Venous Line Family Discussion when available I Assessed the need for Mobilization I made an  Assessment of medications to be adjusted accordingly Safety Risk assessment completed    Critical care provider statement:   Critical care time (minutes): 33  Critical care time was exclusive of: Separately billable procedures and  treating other patients  Critical care was necessary to treat or prevent imminent or  life-threatening deterioration of the following conditions: acute hypoxemic respiratory failure , covid19 pneumonia, DKA.  Critical care was time spent personally by me on the following  activities: Development of treatment plan with patient or surrogate,  discussions with consultants, evaluation of patient's response to  treatment, examination of patient, obtaining history from patient or  surrogate, ordering and performing treatments and interventions, ordering  and review of laboratory studies and re-evaluation of patient's condition  I assumed direction of critical care for this patient from another  provider in my specialty: no

## 2020-02-16 ENCOUNTER — Inpatient Hospital Stay: Payer: BC Managed Care – PPO

## 2020-02-16 LAB — CBC WITH DIFFERENTIAL/PLATELET
Abs Immature Granulocytes: 2.07 10*3/uL — ABNORMAL HIGH (ref 0.00–0.07)
Basophils Absolute: 0.1 10*3/uL (ref 0.0–0.1)
Basophils Relative: 0 %
Eosinophils Absolute: 0 10*3/uL (ref 0.0–0.5)
Eosinophils Relative: 0 %
HCT: 30.1 % — ABNORMAL LOW (ref 36.0–46.0)
Hemoglobin: 9.3 g/dL — ABNORMAL LOW (ref 12.0–15.0)
Immature Granulocytes: 6 %
Lymphocytes Relative: 3 %
Lymphs Abs: 1 10*3/uL (ref 0.7–4.0)
MCH: 31.8 pg (ref 26.0–34.0)
MCHC: 30.9 g/dL (ref 30.0–36.0)
MCV: 103.1 fL — ABNORMAL HIGH (ref 80.0–100.0)
Monocytes Absolute: 1 10*3/uL (ref 0.1–1.0)
Monocytes Relative: 3 %
Neutro Abs: 30.9 10*3/uL — ABNORMAL HIGH (ref 1.7–7.7)
Neutrophils Relative %: 88 %
Platelets: 254 10*3/uL (ref 150–400)
RBC: 2.92 MIL/uL — ABNORMAL LOW (ref 3.87–5.11)
RDW: 15.1 % (ref 11.5–15.5)
Smear Review: ADEQUATE
WBC: 35.2 10*3/uL — ABNORMAL HIGH (ref 4.0–10.5)
nRBC: 3.4 % — ABNORMAL HIGH (ref 0.0–0.2)

## 2020-02-16 LAB — GLUCOSE, CAPILLARY
Glucose-Capillary: 91 mg/dL (ref 70–99)
Glucose-Capillary: 94 mg/dL (ref 70–99)

## 2020-02-16 LAB — MAGNESIUM: Magnesium: 3.2 mg/dL — ABNORMAL HIGH (ref 1.7–2.4)

## 2020-02-16 LAB — RENAL FUNCTION PANEL
Albumin: 1.7 g/dL — ABNORMAL LOW (ref 3.5–5.0)
Anion gap: 12 (ref 5–15)
BUN: 143 mg/dL — ABNORMAL HIGH (ref 6–20)
CO2: 20 mmol/L — ABNORMAL LOW (ref 22–32)
Calcium: 8.6 mg/dL — ABNORMAL LOW (ref 8.9–10.3)
Chloride: 105 mmol/L (ref 98–111)
Creatinine, Ser: 1.97 mg/dL — ABNORMAL HIGH (ref 0.44–1.00)
GFR, Estimated: 29 mL/min — ABNORMAL LOW (ref >60)
Glucose, Bld: 101 mg/dL — ABNORMAL HIGH (ref 70–99)
Phosphorus: 7.5 mg/dL — ABNORMAL HIGH (ref 2.5–4.6)
Potassium: 5.8 mmol/L — ABNORMAL HIGH (ref 3.5–5.1)
Sodium: 137 mmol/L (ref 135–145)

## 2020-02-16 MED ORDER — STERILE WATER FOR INJECTION IJ SOLN
INTRAMUSCULAR | Status: AC
  Filled 2020-02-16: qty 10

## 2020-02-16 MED ORDER — IOHEXOL 9 MG/ML PO SOLN
500.0000 mL | ORAL | Status: DC
  Administered 2020-02-16: 500 mL via ORAL

## 2020-02-16 MED ORDER — IOHEXOL 9 MG/ML PO SOLN
500.0000 mL | ORAL | Status: AC
  Administered 2020-02-16: 500 mL

## 2020-02-20 LAB — BLOOD GAS, ARTERIAL
Acid-base deficit: 1.9 mmol/L (ref 0.0–2.0)
Bicarbonate: 28 mmol/L (ref 20.0–28.0)
FIO2: 1
MECHVT: 500 mL
O2 Saturation: 99.9 %
PEEP: 15 cmH2O
Patient temperature: 37
RATE: 30 resp/min
pCO2 arterial: 75 mmHg (ref 32.0–48.0)

## 2020-03-03 NOTE — Plan of Care (Signed)

## 2020-03-03 NOTE — Plan of Care (Signed)
Care plan complete. Patient deceased.       Problem: Education: Goal: Knowledge of General Education information will improve Description: Including pain rating scale, medication(s)/side effects and non-pharmacologic comfort measures 03-12-2020 1303 by Jesse Sans, RN Outcome: Not Applicable 62/94/7654 6503 by Jesse Sans, RN Outcome: Not Progressing   Problem: Health Behavior/Discharge Planning: Goal: Ability to manage health-related needs will improve March 12, 2020 1303 by Jesse Sans, RN Outcome: Not Applicable 54/65/6812 7517 by Jesse Sans, RN Outcome: Not Progressing   Problem: Clinical Measurements: Goal: Ability to maintain clinical measurements within normal limits will improve March 12, 2020 1303 by Jesse Sans, RN Outcome: Not Applicable 00/17/4944 9675 by Jesse Sans, RN Outcome: Not Progressing Goal: Will remain free from infection Mar 12, 2020 1303 by Jesse Sans, RN Outcome: Not Applicable 91/63/8466 5993 by Jesse Sans, RN Outcome: Not Progressing Goal: Diagnostic test results will improve 12-Mar-2020 1303 by Jesse Sans, RN Outcome: Not Applicable 57/05/7791 9030 by Jesse Sans, RN Outcome: Not Progressing Goal: Respiratory complications will improve March 12, 2020 1303 by Jesse Sans, RN Outcome: Not Applicable 01/23/3006 6226 by Jesse Sans, RN Outcome: Not Progressing Goal: Cardiovascular complication will be avoided Mar 12, 2020 1303 by Jesse Sans, RN Outcome: Not Applicable 33/35/4562 5638 by Jesse Sans, RN Outcome: Not Progressing   Problem: Activity: Goal: Risk for activity intolerance will decrease 03-12-2020 1303 by Jesse Sans, RN Outcome: Not Applicable 93/73/4287 6811 by Jesse Sans, RN Outcome: Not Progressing   Problem: Nutrition: Goal: Adequate nutrition will be maintained 03/12/20 1303 by Jesse Sans, RN Outcome: Not Applicable 57/26/2035 5974 by Jesse Sans,  RN Outcome: Not Progressing   Problem: Coping: Goal: Level of anxiety will decrease March 12, 2020 1303 by Jesse Sans, RN Outcome: Not Applicable 16/38/4536 4680 by Jesse Sans, RN Outcome: Not Progressing   Problem: Elimination: Goal: Will not experience complications related to bowel motility March 12, 2020 1303 by Jesse Sans, RN Outcome: Not Applicable 32/04/2481 5003 by Jesse Sans, RN Outcome: Not Progressing   Problem: Elimination: Goal: Will not experience complications related to bowel motility 2020/03/12 1303 by Jesse Sans, RN Outcome: Not Applicable 70/48/8891 6945 by Jesse Sans, RN Outcome: Not Progressing Goal: Will not experience complications related to urinary retention 03/12/2020 1303 by Jesse Sans, RN Outcome: Not Applicable 03/88/8280 0349 by Jesse Sans, RN Outcome: Not Progressing   Problem: Pain Managment: Goal: General experience of comfort will improve 2020-03-12 1303 by Jesse Sans, RN Outcome: Not Applicable 17/91/5056 9794 by Jesse Sans, RN Outcome: Not Progressing   Problem: Safety: Goal: Ability to remain free from injury will improve 2020/03/12 1303 by Jesse Sans, RN Outcome: Not Applicable 80/16/5537 4827 by Jesse Sans, RN Outcome: Not Progressing   Problem: Skin Integrity: Goal: Risk for impaired skin integrity will decrease 2020-03-12 1303 by Jesse Sans, RN Outcome: Not Applicable 07/86/7544 9201 by Jesse Sans, RN Outcome: Not Progressing

## 2020-03-03 NOTE — Death Summary Note (Signed)
CRITICAL CARE NOTE 54 yo female with a PMH of Acute Pancreatitis, Hypothyroidism, HLD, Type II Diabetes Mellitus, HTN, H Pylori, Depression, Anemia, DDD, and Fatty Liver Disease. She presented to [facility] ER via EMS on 09/28 with altered mental status and c/o "not feeling well." Upon arrival to the ER pt alert to self only. She was prescribed azithromycin and prednisone due to acute respiratory failure by her PCP on 09/26 while awaiting for final COVID-19 results. In the ER lab results ruled pt in for DKA and metabolic acidosis, therefore insulin gtt initiated. COVID-19 positive and CXR concerning for  multifocal pneumonia. CTA Chest negative for PE or acute aortic syndrome, but concerning for multifocal pneumonia. CT Head negative for acute intracranial findings. She was subsequently admitted to the stepdown unit per hospitalist team for additional workup and treatment, but remained in the ER pending bed availability. PCCM team consulted on 09/29 to assist with management of COVID pneumonia and acute metabolic encephalopathy.    54 yo female admitted with acute metabolic encephalopathy, acute metabolic acidosis, DKA, and COVID-19 pneumonia   SIGNIFICANT EVENTS/STUDIES:  09/28: CT Head revealed no acute intracranial findings. Hypoattenuating focus in the right basal ganglia compatible with remote lacunar infarct versus prominent perivascular space as detailed on comparison. 09/28: CTA Chest revealed no pulmonary embolus or acute aortic syndrome. Multifocal, peripheral predominant ground glass opacities, most consistent with multifocal pneumonia, including viral pneumonia. 09/29: Pt admitted to stepdown unit per hospitalist team, however remained in the ER pending bed availability 09/29: PCCM team consulted to assist with management of COVID pneumonia and acute metabolic encephalopathy 6/30: Patient is weaned to 35% on ventilator, weaned off sedation with mentation slowly waking up but not  following verbal communication yet. Will hold off on EEG for now due to SBT and awakening trial. Some concern for possible seizure activity overnight, we are monitoring closely and will perform EEG if any re-emergence of symptoms is noted.  02/01/20- patient is liberated from MV, she still is encephalopathic its unclear if she had central event. I discussed case with neurology and there is plan for repeat EEG.  10/3- patient got re-intubated overnight due to encephalopathy which seems to be chronic recurrent. I have discussed with husband most recent events and care plan 10/4- patient remains critically ill, neurologic evaluation on going 10/5- Worsening hypoxia when patient turned supine from prone, requiring 100% FiO2 and deep sedation/paralysis 10/6-not tolerating proning, looks miserable on the vent,agonal breathing 10/7severe resp failure, family updated, confrimed DNR status 10/9 severe ARDS 10/10 severe ARDS, severe shock, patient on multiple pressors 10/11 severe ARDS, Husband updated 10/12 severe ARDS fio2 at 95% 10/14 severe hypoxia and severe shock, patient is in dying process 02/15/20- unable to tolerate proning, currently on 90%FIO2 DNR status.  She is 32L+. Lasix challenge today with >1L urine thus far on day shift and with vitals in reference range on pressors. Called husband and was unable to leave message on cell phone "Voice mail not set up yet" 03-05-20- patient went into respiratory distress ovenight and was increased to 100% FiO2.  RN called me to notify of profound hypotension with circulatory shock despite pressors support. Patient with severe hypoxemia on maximal MV support. Called family to let them know patient is at risk of cardiac arrest at current time. Husband and daughter on the way to hospital.   Patient passed away with family including husband and daughter and friend at bedside. May she rest in peace.    CC  follow up respiratory  failure  SUBJECTIVE Patient remains critically ill Prognosis is guarded Poor prognosis Patient is suffering and in dying process  Multiorgan failure    CBC    Component Value Date/Time   WBC 35.2 (H) 03/07/20 0532   RBC 2.92 (L) 07-Mar-2020 0532   HGB 9.3 (L) Mar 07, 2020 0532   HCT 30.1 (L) 2020/03/07 0532   PLT 254 2020-03-07 0532   MCV 103.1 (H) 03-07-20 0532   MCH 31.8 Mar 07, 2020 0532   MCHC 30.9 2020/03/07 0532   RDW 15.1 03/07/20 0532   LYMPHSABS 1.0 March 07, 2020 0532   MONOABS 1.0 03/07/2020 0532   EOSABS 0.0 03/07/2020 0532   BASOSABS 0.1 2020-03-07 0532   BMP Latest Ref Rng & Units 2020-03-07 02/15/2020 02/15/2020  Glucose 70 - 99 mg/dL 101(H) - 133(H)  BUN 6 - 20 mg/dL 143(H) - 140(H)  Creatinine 0.44 - 1.00 mg/dL 1.97(H) - 1.94(H)  Sodium 135 - 145 mmol/L 137 - 137  Potassium 3.5 - 5.1 mmol/L 5.8(H) 5.4(H) 5.7(H)  Chloride 98 - 111 mmol/L 105 - 107  CO2 22 - 32 mmol/L 20(L) - 20(L)  Calcium 8.9 - 10.3 mg/dL 8.6(L) - 8.6(L)      BP 102/64   Pulse (!) 107   Temp 99.1 F (37.3 C)   Resp (!) 4   Ht 5\' 3"  (1.6 m)   Wt 118 kg   SpO2 94%   BMI 46.08 kg/m    No intake/output data recorded. No intake/output data recorded.  SpO2: 94 % O2 Flow Rate (L/min): 15 L/min FiO2 (%): 100 %  Estimated body mass index is 46.08 kg/m as calculated from the following:   Height as of this encounter: 5\' 3"  (1.6 m).   Weight as of this encounter: 118 kg.  SIGNIFICANT EVENTS   REVIEW OF SYSTEMS  PATIENT IS UNABLE TO PROVIDE COMPLETE REVIEW OF SYSTEMS DUE TO SEVERE CRITICAL ILLNESS   Pressure Injury 02/15/20 Lip Medial Stage 2 -  Partial thickness loss of dermis presenting as a shallow open injury with a red, pink wound bed without slough. (Active)  02/15/20 0840  Location: Lip  Location Orientation: Medial  Staging: Stage 2 -  Partial thickness loss of dermis presenting as a shallow open injury with a red, pink wound bed without slough.  Wound Description  (Comments):   Present on Admission: No     PHYSICAL EXAMINATION:  GENERAL:critically ill appearing, +Anasarca  NEUROLOGIC: obtunded, GCS<4T HEENT - scleral edema+ Respiratory - +crackles and rhonchi Cardiovascular- NSR GI - distended no BSx4 Musculoskeletal - 3+edema x 4 Integumentary - intact grossly   MEDICATIONS: I have reviewed all medications and confirmed regimen as documented   CULTURE RESULTS   No results found for this or any previous visit (from the past 240 hour(s)).        IMAGING    No results found.   Nutrition Status: Nutrition Problem: Inadequate oral intake Etiology: inability to eat Signs/Symptoms: NPO status Interventions: Tube feeding, Prostat, MVI     Indwelling Urinary Catheter continued, requirement due to   Reason to continue Indwelling Urinary Catheter strict Intake/Output monitoring for hemodynamic instability   Central Line/ continued, requirement due to  Reason to continue [place] of central venous pressure or other hemodynamic parameters and poor IV access   Ventilator continued, requirement due to severe respiratory failure   Ventilator Sedation RASS 0 to -2      ASSESSMENT AND PLAN SYNOPSIS   Acute hypoxic respiratory failure secondary to COVID-19 pneumonia and acute  metabolic acidosis  Severe ARDS - DNR status - complex situation with onset of anasarca  Prn supplemental O2 to maintain O2 sats 88% or higher s/p remdesivir, vitamins, and iv steroids Maximal support on MV - FiO2 100%  Circulatory shock with anasarca   - patient is at very high risk of death   - called family to come in immediately as patient is peri arrest     Diabetic Ketoacidosis -resolved  Severe metabolic acidosis in the setting of DKA  Continuous telemetry monitoring    AKI stage 3 KDIGO  - with anasarca   - family wishes to not have CRRT  - will perform lasix trial today   - UOP 30cc despite lasix 40 today -  10/16   Altered mental status with encephalopathy  -neurology on case - appreciate input  - acyclovir started  - possible LP   - thyroid workup  - patient on seroquel from home  - reviewed plan with neurologist - plan for EEG-Srishti Bhagat MD-PhD [facility]             -continue acyclovir -MRI brain w/ and w/o contrast if patient medically stable enough for this study  -HSV PCR testing added on the CSF via [facility]    Hypothyroidism  TSH - patient on synthroid 300 per neurology    Best Practice VTE px: subq lovenox SUP px: pepcid  Code status: Full code   CARDIAC ICU monitoring  ID -continue IV abx as prescibed -follow up cultures  GI GI PROPHYLAXIS as indicated   DIET-->TF's as tolerated Constipation protocol as indicated  ENDO - will use ICU hypoglycemic\Hyperglycemia protocol if indicated     ELECTROLYTES -follow labs as needed -replace as needed -pharmacy consultation and following   DVT/GI PRX ordered and assessed TRANSFUSIONS AS NEEDED MONITOR FSBS I Assessed the need for Labs I Assessed the need for Foley I Assessed the need for Central Venous Line Family Discussion when available I Assessed the need for Mobilization I made an Assessment of medications to be adjusted accordingly Safety Risk assessment completed     Ottie Glazier, M.D.  Pulmonary & [place]

## 2020-03-03 NOTE — Progress Notes (Addendum)
Shift summary:  - Patient remains intubated and sedated.  - Patient expired on the ventilator at 1014 hrs this AM. Verified by this RN and Durene Fruits, RN. Dr. Lanney Gins at bedside at time of passing. Family to the room immediately after passing. CDS notified.

## 2020-03-03 NOTE — Progress Notes (Signed)
Pt with severe abdominal distension KUB revealed paucity of bowel gas.  Therefore, ordered CT Abd Pelvis without iv contrast to rule out small bowel obstruction and/or ischemia.  However, while RN and respiratory therapist were preparing pt for transport her O2 sats decreased to the mid 70's.  Due to pt instability CT Abd Pelvis canceled.  Pt currently requiring PEEP of 12 and FiO2 @100 % with current O2 sats @96 % will wean as tolerated.    Marda Stalker, [place] Pager [phone] (please enter 7 digits) PCCM Consult Pager [phone] (please enter 7 digits)

## 2020-03-03 NOTE — Progress Notes (Signed)
Shift summary:  - Patient remains intubated and sedated.  - Weaning pressors as tolerated.

## 2020-03-03 NOTE — Progress Notes (Signed)
CRITICAL CARE NOTE 54 yo female with a PMH of Acute Pancreatitis, Hypothyroidism, HLD, Type II Diabetes Mellitus, HTN, H Pylori, Depression, Anemia, DDD, and Fatty Liver Disease. She presented to [facility] ER via EMS on 09/28 with altered mental status and c/o "not feeling well." Upon arrival to the ER pt alert to self only. She was prescribed azithromycin and prednisone due to acute respiratory failure by her PCP on 09/26 while awaiting for final COVID-19 results. In the ER lab results ruled pt in for DKA and metabolic acidosis, therefore insulin gtt initiated. COVID-19 positive and CXR concerning for  multifocal pneumonia. CTA Chest negative for PE or acute aortic syndrome, but concerning for multifocal pneumonia. CT Head negative for acute intracranial findings. She was subsequently admitted to the stepdown unit per hospitalist team for additional workup and treatment, but remained in the ER pending bed availability. PCCM team consulted on 09/29 to assist with management of COVID pneumonia and acute metabolic encephalopathy.    54 yo female admitted with acute metabolic encephalopathy, acute metabolic acidosis, DKA, and COVID-19 pneumonia   SIGNIFICANT EVENTS/STUDIES:  09/28: CT Head revealed no acute intracranial findings. Hypoattenuating focus in the right basal ganglia compatible with remote lacunar infarct versus prominent perivascular space as detailed on comparison. 09/28: CTA Chest revealed no pulmonary embolus or acute aortic syndrome. Multifocal, peripheral predominant ground glass opacities, most consistent with multifocal pneumonia, including viral pneumonia. 09/29: Pt admitted to stepdown unit per hospitalist team, however remained in the ER pending bed availability 09/29: PCCM team consulted to assist with management of COVID pneumonia and acute metabolic encephalopathy 0/34: Patient is weaned to 35% on ventilator, weaned off sedation with mentation slowly waking up but not  following verbal communication yet. Will hold off on EEG for now due to SBT and awakening trial. Some concern for possible seizure activity overnight, we are monitoring closely and will perform EEG if any re-emergence of symptoms is noted.  02/01/20- patient is liberated from MV, she still is encephalopathic its unclear if she had central event. I discussed case with neurology and there is plan for repeat EEG.  10/3- patient got re-intubated overnight due to encephalopathy which seems to be chronic recurrent. I have discussed with husband most recent events and care plan 10/4- patient remains critically ill, neurologic evaluation on going 10/5- Worsening hypoxia when patient turned supine from prone, requiring 100% FiO2 and deep sedation/paralysis 10/6-not tolerating proning, looks miserable on the vent,agonal breathing 10/7severe resp failure, family updated, confrimed DNR status 10/9 severe ARDS 10/10 severe ARDS, severe shock, patient on multiple pressors 10/11 severe ARDS, Husband updated 10/12 severe ARDS fio2 at 95% 10/14 severe hypoxia and severe shock, patient is in dying process 02/15/20- unable to tolerate proning, currently on 90%FIO2 DNR status.  She is 32L+. Lasix challenge today with >1L urine thus far on day shift and with vitals in reference range on pressors. Called husband and was unable to leave message on cell phone "Voice mail not set up yet" 02/19/2020- patient went into respiratory distress ovenight and was increased to 100% FiO2.  RN called me to notify of profound hypotension with circulatory shock despite pressors support. Patient with severe hypoxemia on maximal MV support. Called family to let them know patient is at risk of cardiac arrest at current time. Husband and daughter on the way to hospital.    CC  follow up respiratory failure  SUBJECTIVE Patient remains critically ill Prognosis is guarded Poor prognosis Patient is suffering and in dying process  Multiorgan failure  Vent Mode: PRVC FiO2 (%):  [80 %-100 %] 100 % Set Rate:  [34 bmp] 34 bmp Vt Set:  [500 mL] 500 mL PEEP:  [12 cmH20] 12 cmH20 Plateau Pressure:  [31 cmH20] 31 cmH20 CBC    Component Value Date/Time   WBC 35.2 (H) 23-Feb-2020 0532   RBC 2.92 (L) 2020-02-23 0532   HGB 9.3 (L) Feb 23, 2020 0532   HCT 30.1 (L) 23-Feb-2020 0532   PLT 254 23-Feb-2020 0532   MCV 103.1 (H) 2020/02/23 0532   MCH 31.8 02-23-2020 0532   MCHC 30.9 02/23/2020 0532   RDW 15.1 2020/02/23 0532   LYMPHSABS 1.0 Feb 23, 2020 0532   MONOABS 1.0 February 23, 2020 0532   EOSABS 0.0 2020/02/23 0532   BASOSABS 0.1 Feb 23, 2020 0532   BMP Latest Ref Rng & Units 02/23/2020 02/15/2020 02/15/2020  Glucose 70 - 99 mg/dL 101(H) - 133(H)  BUN 6 - 20 mg/dL 143(H) - 140(H)  Creatinine 0.44 - 1.00 mg/dL 1.97(H) - 1.94(H)  Sodium 135 - 145 mmol/L 137 - 137  Potassium 3.5 - 5.1 mmol/L 5.8(H) 5.4(H) 5.7(H)  Chloride 98 - 111 mmol/L 105 - 107  CO2 22 - 32 mmol/L 20(L) - 20(L)  Calcium 8.9 - 10.3 mg/dL 8.6(L) - 8.6(L)      BP 102/64   Pulse (!) 107   Temp 99 F (37.2 C)   Resp (!) 32   Ht 5' 2.99" (1.6 m)   Wt 118 kg   SpO2 94%   BMI 46.09 kg/m    I/O last 3 completed shifts: In: 4462.5 [I.V.:3842.4; NG/GT:370; IV Piggyback:250] Out: 9629 [Urine:2460; Emesis/NG output:2250; Stool:10] Total I/O In: 292.8 [I.V.:142.8; NG/GT:150] Out: 45 [Urine:45]  SpO2: 94 % O2 Flow Rate (L/min): 15 L/min FiO2 (%): 100 %  Estimated body mass index is 46.09 kg/m as calculated from the following:   Height as of this encounter: 5' 2.99" (1.6 m).   Weight as of this encounter: 118 kg.  SIGNIFICANT EVENTS   REVIEW OF SYSTEMS  PATIENT IS UNABLE TO PROVIDE COMPLETE REVIEW OF SYSTEMS DUE TO SEVERE CRITICAL ILLNESS   Pressure Injury 02/15/20 Lip Medial Stage 2 -  Partial thickness loss of dermis presenting as a shallow open injury with a red, pink wound bed without slough. (Active)  02/15/20 0840  Location: Lip   Location Orientation: Medial  Staging: Stage 2 -  Partial thickness loss of dermis presenting as a shallow open injury with a red, pink wound bed without slough.  Wound Description (Comments):   Present on Admission: No     PHYSICAL EXAMINATION:  GENERAL:critically ill appearing, +Anasarca  NEUROLOGIC: obtunded, GCS<4T HEENT - scleral edema+ Respiratory - +crackles and rhonchi Cardiovascular- NSR GI - distended no BSx4 Musculoskeletal - 3+edema x 4 Integumentary - intact grossly   MEDICATIONS: I have reviewed all medications and confirmed regimen as documented   CULTURE RESULTS   Recent Results (from the past 240 hour(s))  Culture, respiratory (non-expectorated)     Status: None   Collection Time: 02/10/20  3:15 AM   Specimen: Tracheal Aspirate; Respiratory  Result Value Ref Range Status   Specimen Description   Final    TRACHEAL ASPIRATE Performed at [facility], [address], [place], [place] [address]    Special Requests   Final    NONE Performed at [facility], [address] [place], [place] [address]    Gram Stain   Final    RARE WBC PRESENT, PREDOMINANTLY PMN ABUNDANT GRAM POSITIVE RODS Performed  at [facility], [place] [address] [place], [place] [address]    Culture ABUNDANT KLEBSIELLA PNEUMONIAE  Final   Report Status 02/12/2020 FINAL  Final   Organism ID, Bacteria KLEBSIELLA PNEUMONIAE  Final      Susceptibility   Klebsiella pneumoniae - MIC*    AMPICILLIN RESISTANT Resistant     CEFAZOLIN <=4 SENSITIVE Sensitive     CEFEPIME <=0.12 SENSITIVE Sensitive     CEFTAZIDIME <=1 SENSITIVE Sensitive     CEFTRIAXONE <=0.25 SENSITIVE Sensitive     CIPROFLOXACIN <=0.25 SENSITIVE Sensitive     GENTAMICIN <=1 SENSITIVE Sensitive     IMIPENEM <=0.25 SENSITIVE Sensitive     TRIMETH/SULFA <=20 SENSITIVE Sensitive     AMPICILLIN/SULBACTAM 8 SENSITIVE Sensitive     PIP/TAZO 8 SENSITIVE Sensitive     * ABUNDANT KLEBSIELLA PNEUMONIAE           IMAGING    DG Abd 1 View  Result Date: 02/15/2020 CLINICAL DATA:  Abdominal distension EXAM: ABDOMEN - 1 VIEW COMPARISON:  02/11/2020 FINDINGS: Gastric catheter is noted within the stomach. There is a paucity of bowel gas identified. Postsurgical changes in the lower lumbar spine are seen. No free air is noted. IMPRESSION: Paucity of bowel gas.  No other focal abnormality is noted. Electronically Signed   By: Inez Catalina M.D.   On: 02/15/2020 23:58   DG Chest Port 1 View  Result Date: 02/15/2020 CLINICAL DATA:  Acute respiratory failure, COVID-19 positivity EXAM: PORTABLE CHEST 1 VIEW COMPARISON:  02/14/2020 FINDINGS: Endotracheal tube and gastric catheter are noted in satisfactory position. Cardiac shadow is stable. Diffuse airspace opacity is identified throughout both lungs consistent with the given clinical history. The overall appearance is similar to that seen on the prior exam. No bony abnormality is noted. Right-sided PICC line is noted in satisfactory position. IMPRESSION: Stable airspace disease bilaterally. Tubes and lines as described above. Electronically Signed   By: Inez Catalina M.D.   On: 02/15/2020 23:59     Nutrition Status: Nutrition Problem: Inadequate oral intake Etiology: inability to eat Signs/Symptoms: NPO status Interventions: Tube feeding, Prostat, MVI     Indwelling Urinary Catheter continued, requirement due to   Reason to continue Indwelling Urinary Catheter strict Intake/Output monitoring for hemodynamic instability   Central Line/ continued, requirement due to  Reason to continue [place] of central venous pressure or other hemodynamic parameters and poor IV access   Ventilator continued, requirement due to severe respiratory failure   Ventilator Sedation RASS 0 to -2      ASSESSMENT AND PLAN SYNOPSIS   Acute hypoxic respiratory failure secondary to COVID-19 pneumonia and acute metabolic acidosis  Severe ARDS -  DNR status - complex situation with onset of anasarca  Prn supplemental O2 to maintain O2 sats 88% or higher s/p remdesivir, vitamins, and iv steroids Maximal support on MV - FiO2 100%  Circulatory shock with anasarca   - patient is at very high risk of death   - called family to come in immediately as patient is peri arrest     Diabetic Ketoacidosis -resolved  Severe metabolic acidosis in the setting of DKA  Continuous telemetry monitoring    AKI stage 3 KDIGO  - with anasarca   - family wishes to not have CRRT  - will perform lasix trial today   - UOP 30cc despite lasix 40 today - 10/16   Altered mental status with encephalopathy  -neurology on case - appreciate input  - acyclovir  started  - possible LP   - thyroid workup  - patient on seroquel from home  - reviewed plan with neurologist - plan for EEG-Srishti Bhagat MD-PhD [facility]             -continue acyclovir -MRI brain w/ and w/o contrast if patient medically stable enough for this study  -HSV PCR testing added on the CSF via [facility]    Hypothyroidism  TSH - patient on synthroid 300 per neurology    Best Practice VTE px: subq lovenox SUP px: pepcid  Code status: Full code   CARDIAC ICU monitoring  ID -continue IV abx as prescibed -follow up cultures  GI GI PROPHYLAXIS as indicated   DIET-->TF's as tolerated Constipation protocol as indicated  ENDO - will use ICU hypoglycemic\Hyperglycemia protocol if indicated     ELECTROLYTES -follow labs as needed -replace as needed -pharmacy consultation and following   DVT/GI PRX ordered and assessed TRANSFUSIONS AS NEEDED MONITOR FSBS I Assessed the need for Labs I Assessed the need for Foley I Assessed the need for Central Venous Line Family Discussion when available I Assessed the need for Mobilization I made an Assessment of medications to be adjusted accordingly Safety Risk assessment completed    Critical care  provider statement:   Critical care time (minutes): 33  Critical care time was exclusive of: Separately billable procedures and  treating other patients  Critical care was necessary to treat or prevent imminent or  life-threatening deterioration of the following conditions: acute hypoxemic respiratory failure , covid19 pneumonia, DKA.  Critical care was time spent personally by me on the following  activities: Development of treatment plan with patient or surrogate,  discussions with consultants, evaluation of patient's response to  treatment, examination of patient, obtaining history from patient or  surrogate, ordering and performing treatments and interventions, ordering  and review of laboratory studies and re-evaluation of patient's condition  I assumed direction of critical care for this patient from another  provider in my specialty: no

## 2020-03-03 DEATH — deceased

## 2021-12-22 IMAGING — DX DG CHEST 1V PORT
1 series · 1 of 1 positions shown · non-contrast
Comparison: 01/30/2020.

CLINICAL DATA: Acute respiratory failure.

EXAM:
PORTABLE CHEST 1 VIEW

[chest ap]
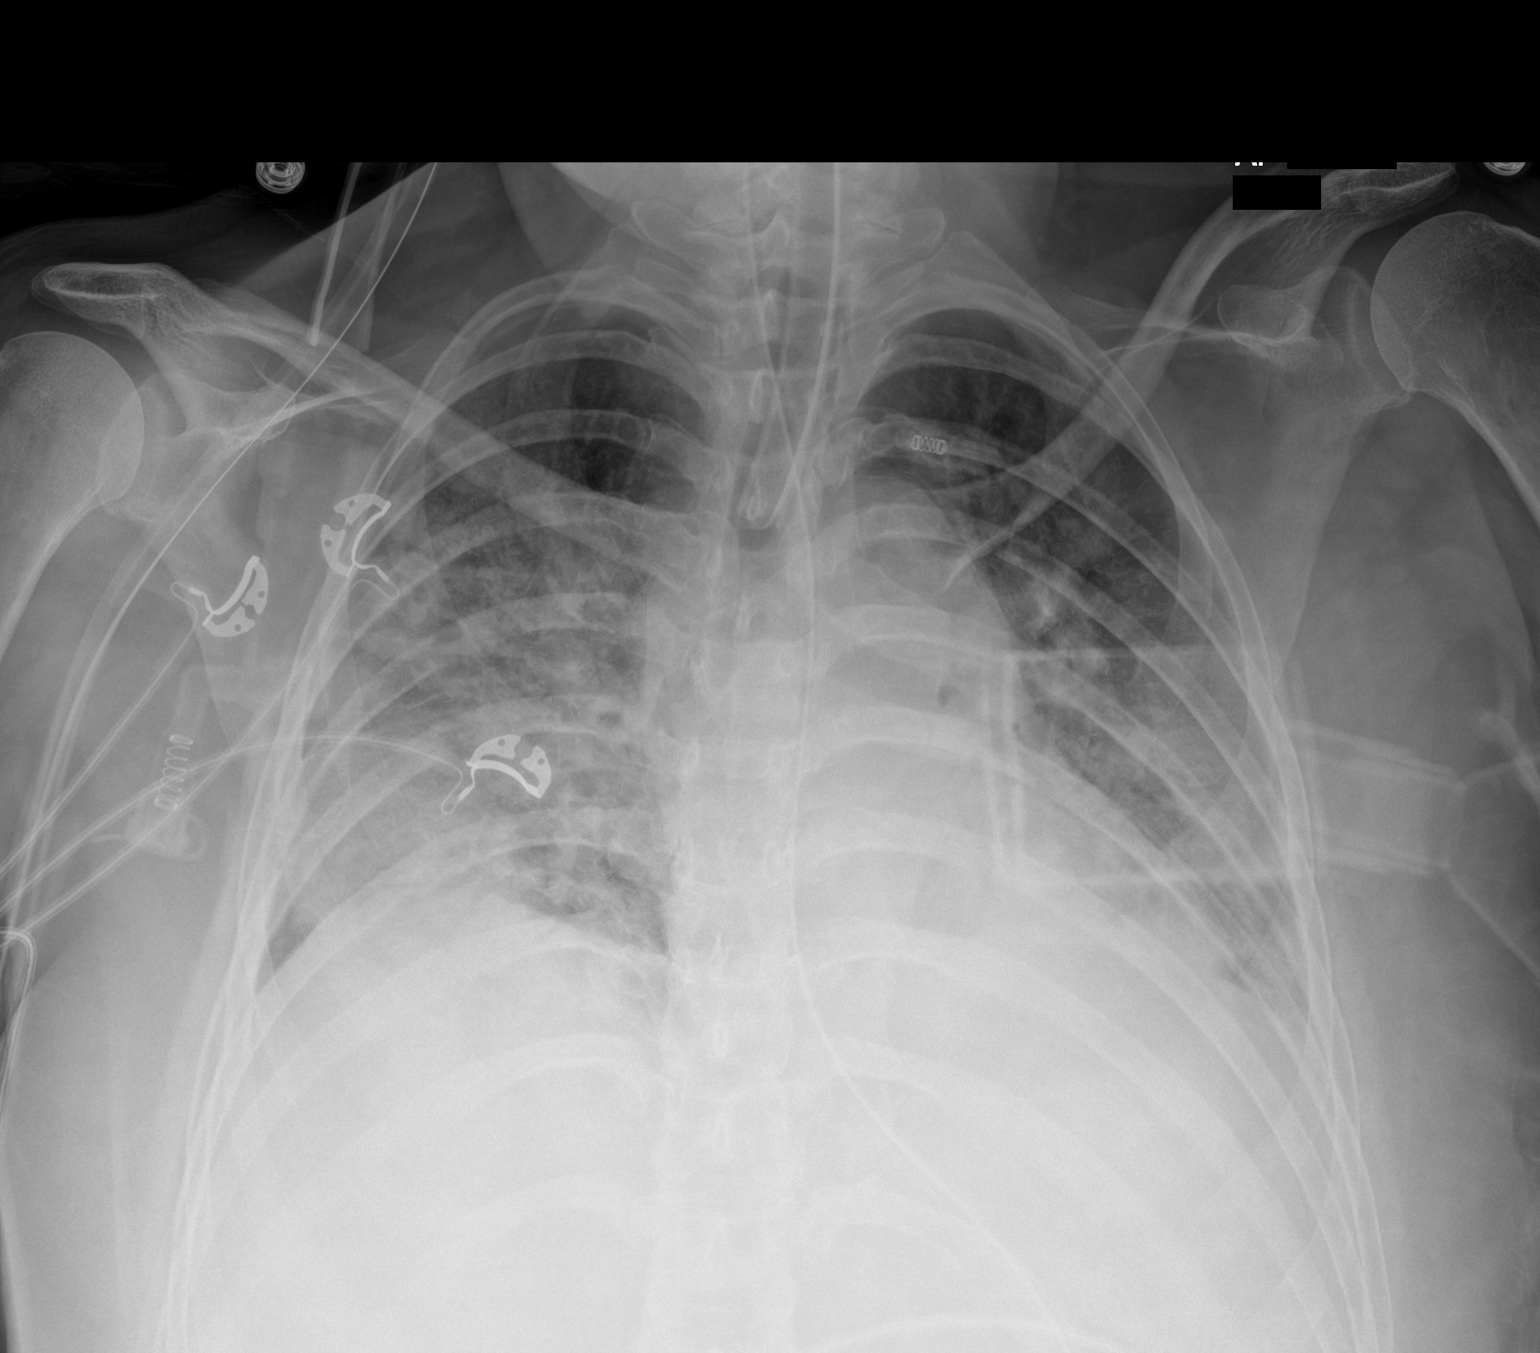

[1 of 1 positions shown; findings below may reference images not displayed]

FINDINGS: Endotracheal tube, NG tube in stable position. Heart size normal.
Low lung volumes. Progressive diffuse bilateral pulmonary
infiltrates/edema. No pleural effusion or pneumothorax.
IMPRESSION: 1. Lines and tubes in stable position.
2. Low lung volumes. Progressive diffuse bilateral pulmonary
infiltrates/edema.

## 2021-12-24 IMAGING — DX DG ABDOMEN 1V
1 series · 1 of 1 positions shown · non-contrast
Comparison: 01/30/2020 abdominal radiograph

CLINICAL DATA: NG tube placement

EXAM:
ABDOMEN - 1 VIEW

[abdomen supine]
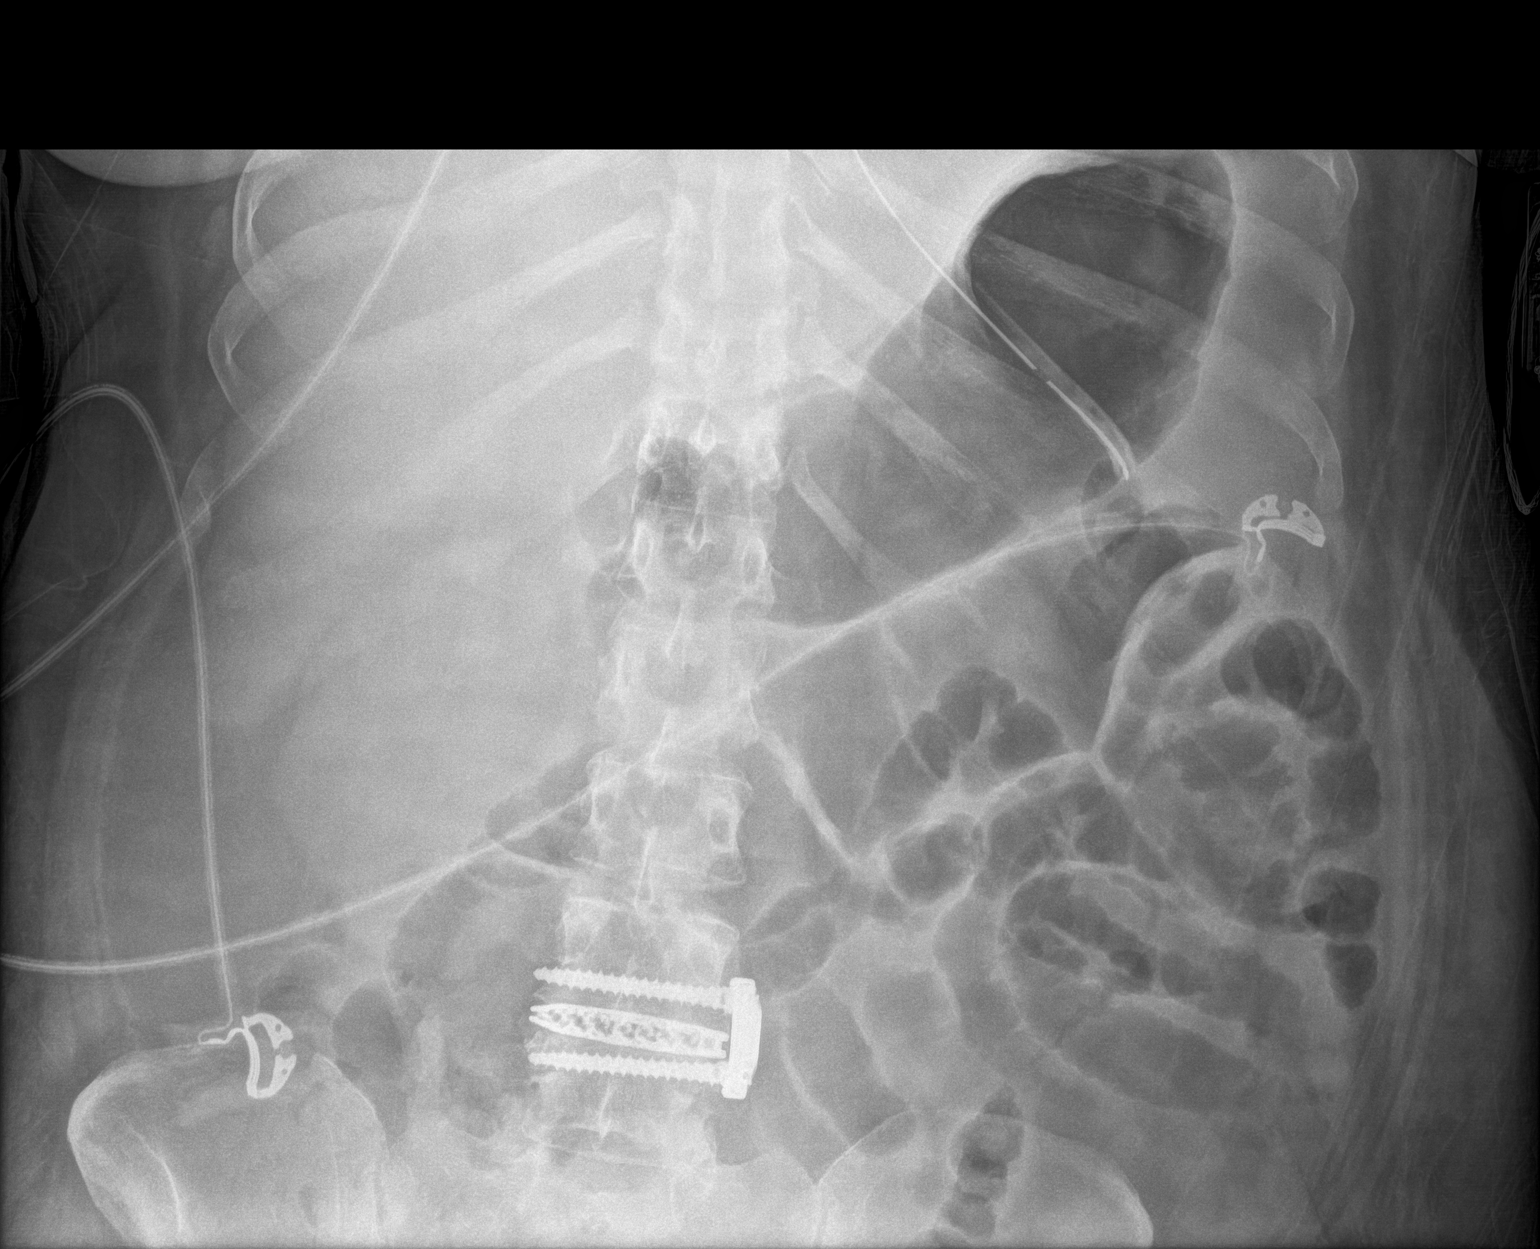

[1 of 1 positions shown; findings below may reference images not displayed]

FINDINGS: Enteric tube terminates in the proximal stomach. No
disproportionately dilated small bowel loops. No evidence of
pneumatosis or pneumoperitoneum. Left lateral spinal fusion at L4-5.
IMPRESSION: Enteric tube terminates in the proximal stomach.

## 2021-12-24 IMAGING — DX DG CHEST 1V PORT
1 series · 1 of 1 positions shown · non-contrast
Comparison: 01/31/2020 chest radiograph.

CLINICAL DATA: COVID positive, intubated

EXAM:
PORTABLE CHEST 1 VIEW

[chest ap]
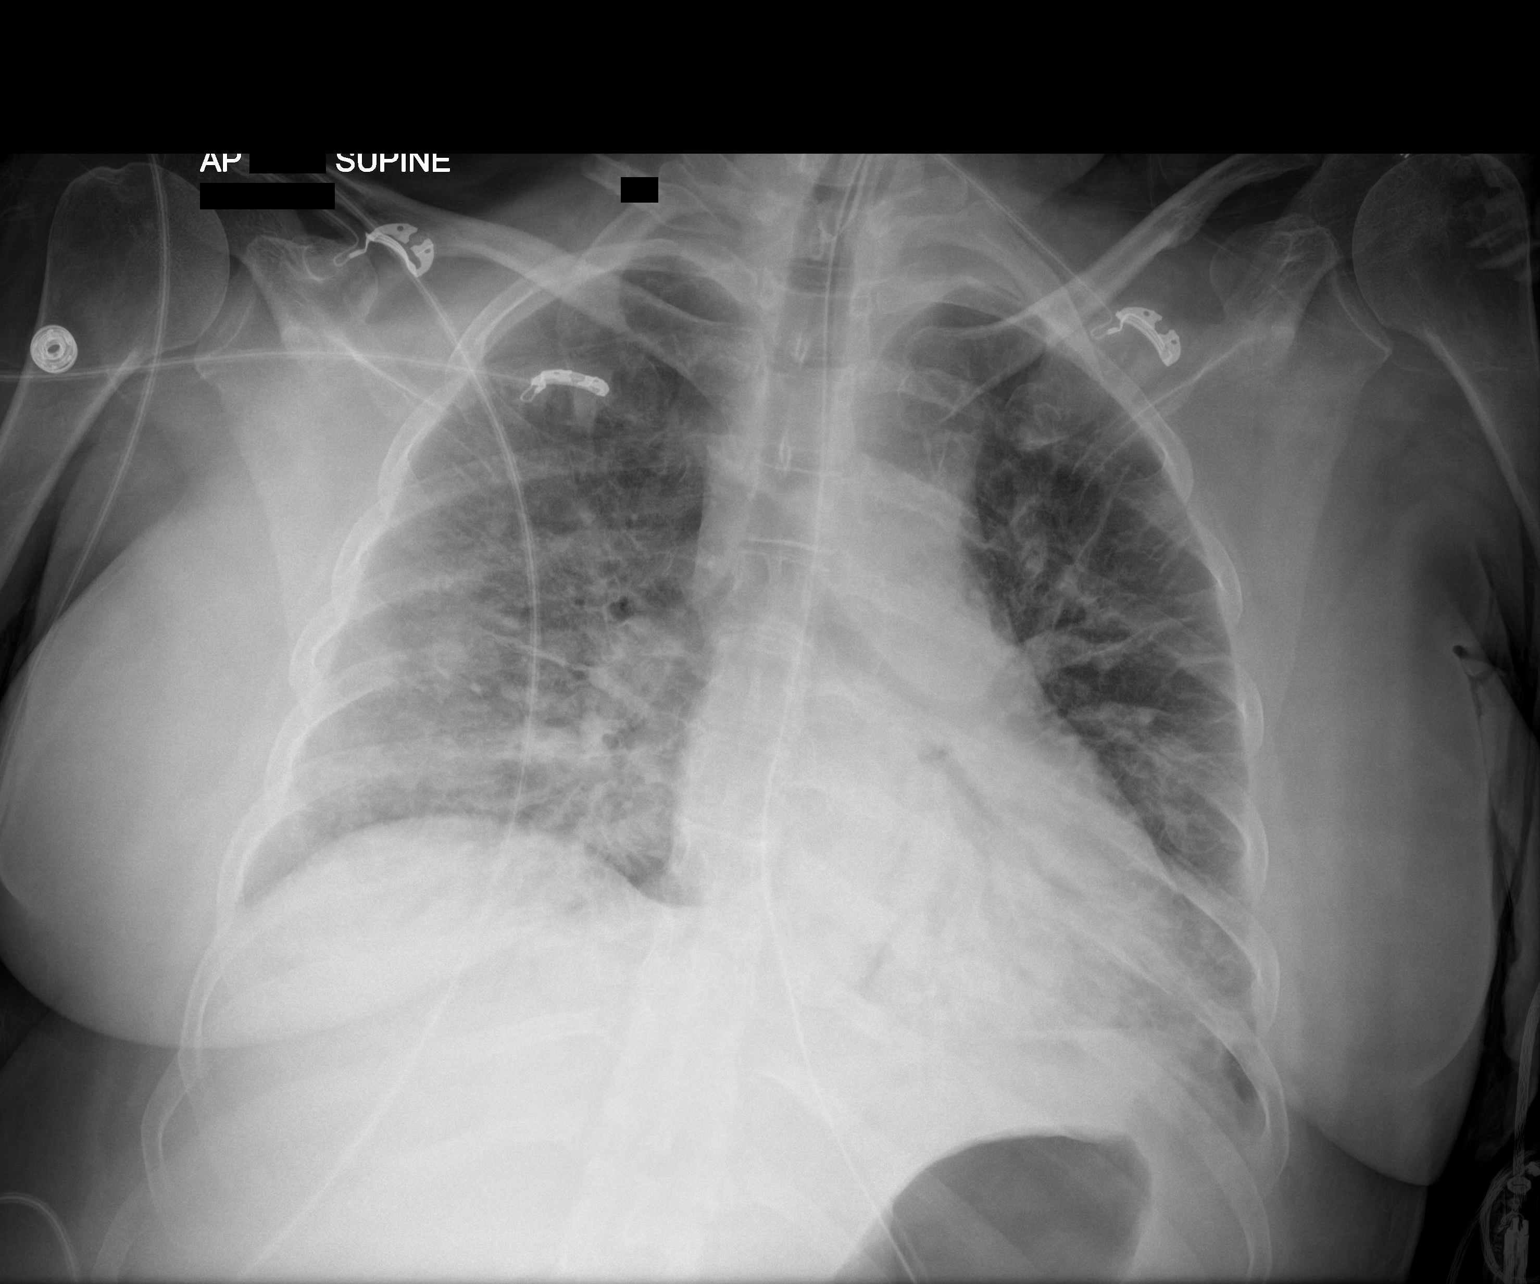

[1 of 1 positions shown; findings below may reference images not displayed]

FINDINGS: Endotracheal tube tip is 9.5 cm above the carina. Enteric tube
enters stomach with the tip not seen on this image. Stable
cardiomediastinal silhouette with normal heart size. No
pneumothorax. No pleural effusion. Hazy patchy opacities in lungs
bilaterally, most prominent in the lower lungs, similar.
IMPRESSION: 1. Endotracheal tube tip is 9.5 cm above the carina, consider
advancing 3 cm.
2. Stable hazy patchy opacities in both lungs, most prominent in the
lower lungs, compatible with [id number] pneumonia.

These results will be called to the ordering clinician or
representative by the Radiologist Assistant, and communication
documented in the PACS or [REDACTED].

## 2021-12-26 IMAGING — DX DG CHEST 1V PORT
1 series · 1 of 1 positions shown · non-contrast
Comparison: 02/02/2020 and CT chest 01/29/2020.

CLINICAL DATA: Acute respiratory failure, COVID positive.

EXAM:
PORTABLE CHEST 1 VIEW

[chest ap]
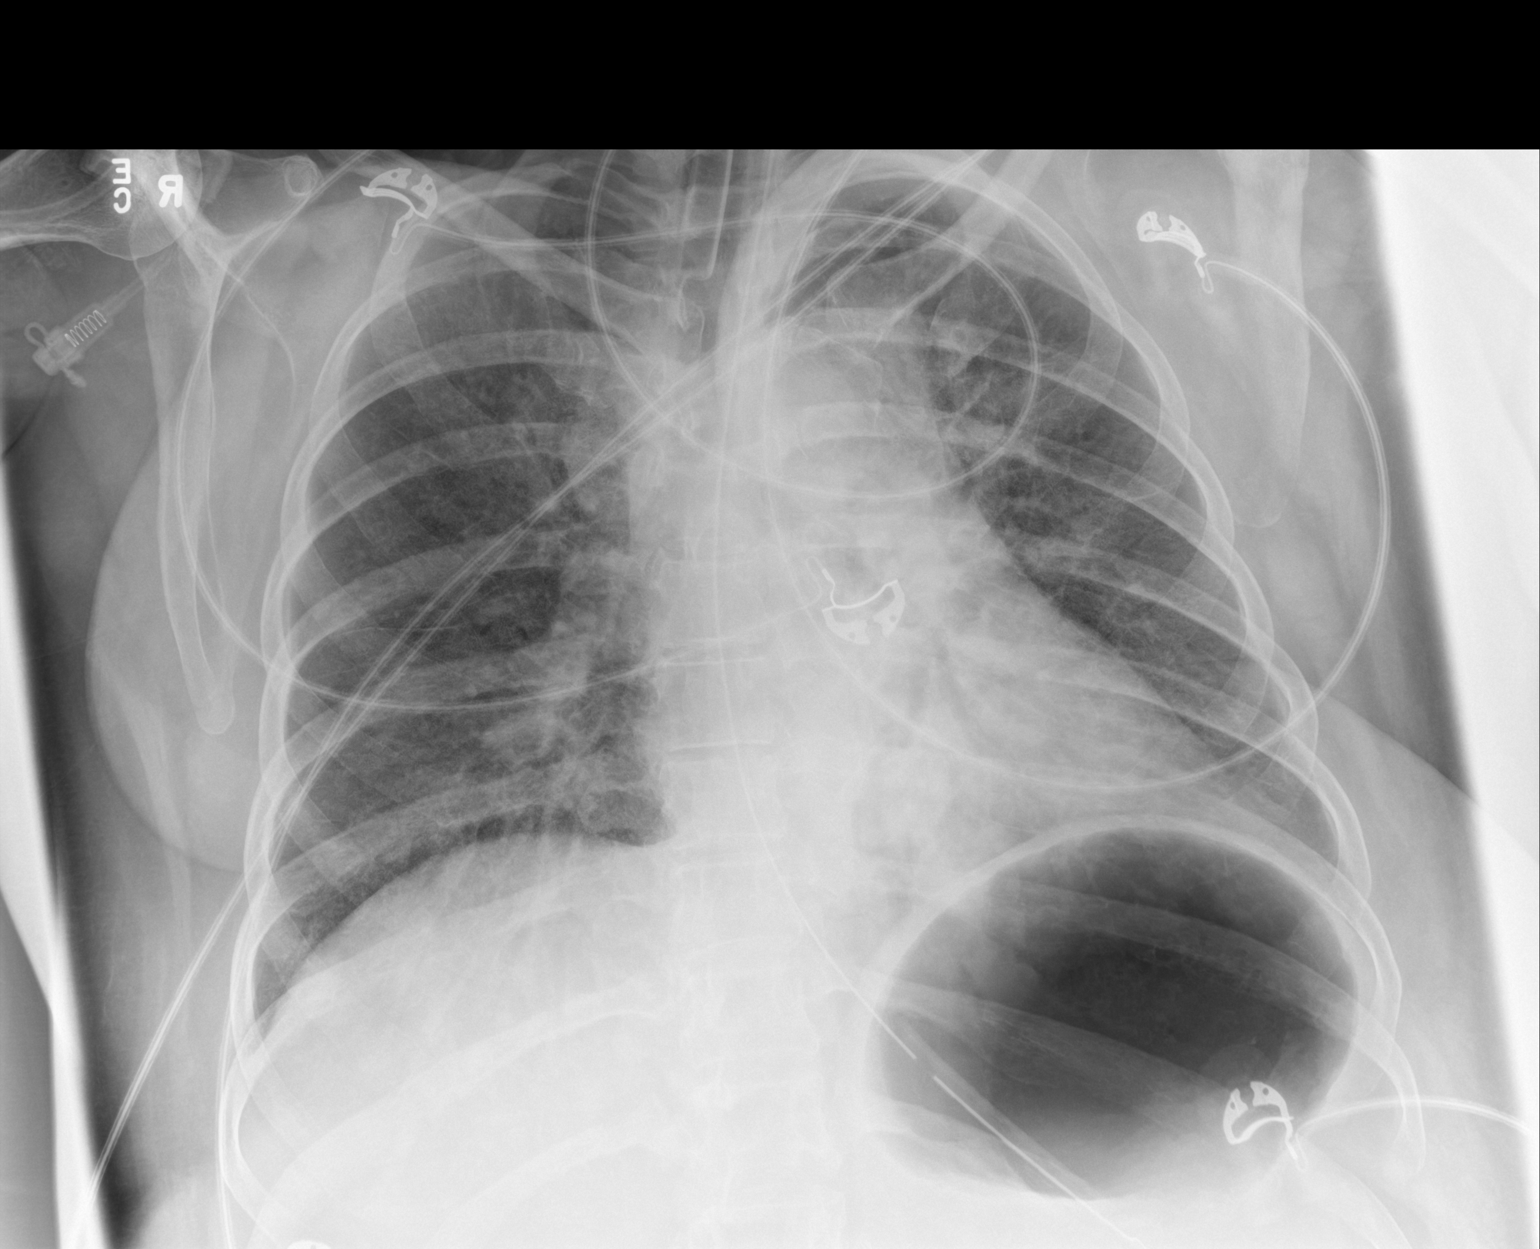

[1 of 1 positions shown; findings below may reference images not displayed]

FINDINGS: Endotracheal tube terminates 6.1 cm above the carina. Nasogastric
tube terminates in the stomach with the side port beyond the GE
junction. Heart size stable. Slight overall improvement in patchy
bilateral airspace opacification. No pleural fluid. No pneumothorax.
IMPRESSION: Improving COVID 19 pneumonia.

## 2021-12-27 IMAGING — CT CT VENOGRAM HEAD
1 of 6 series · 19 of 47 positions shown · IV contrast (APPLIED)
Comparison: CT head 01/29/2020

CLINICAL DATA: Altered mental status, COVID pneumonia

EXAM:
CT ANGIOGRAPHY HEAD
CT VENOGRAM HEAD
TECHNIQUE: Multidetector CT imaging of the head was performed using the
standard protocol before and during bolus administration of
intravenous contrast. Postcontrast imaging was repeated in the
venous phase. Multiplanar CT image reconstructions and MIPs were
obtained to evaluate the vascular anatomy.
CONTRAST:  75mL OMNIPAQUE IOHEXOL 350 MG/ML SOLN

[Series 4: head w · axial · 0.47mm/px · z∈[-105,+21]mm · 19 of 71 slices shown]
[im 4/71  brain]
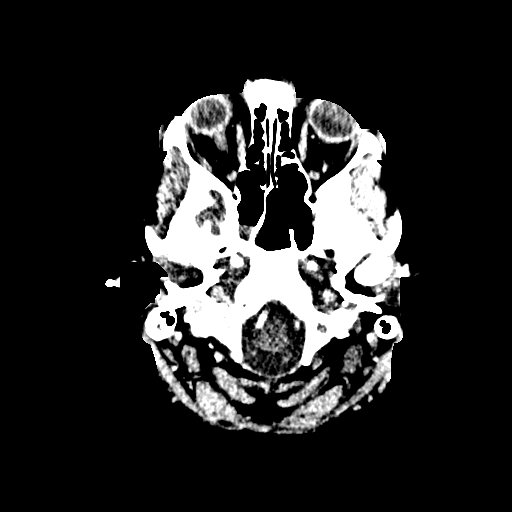
[im 8/71  bone]
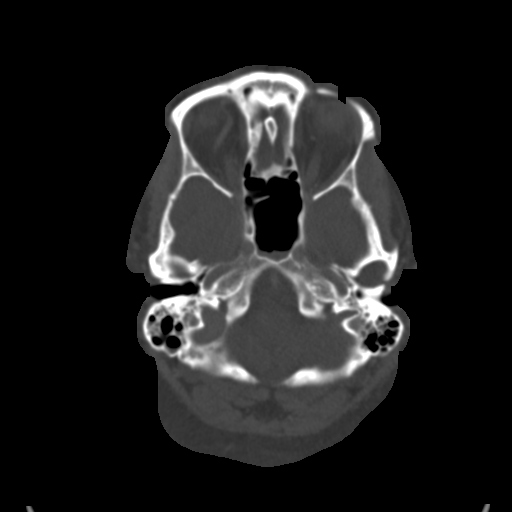
[im 11/71  brain]
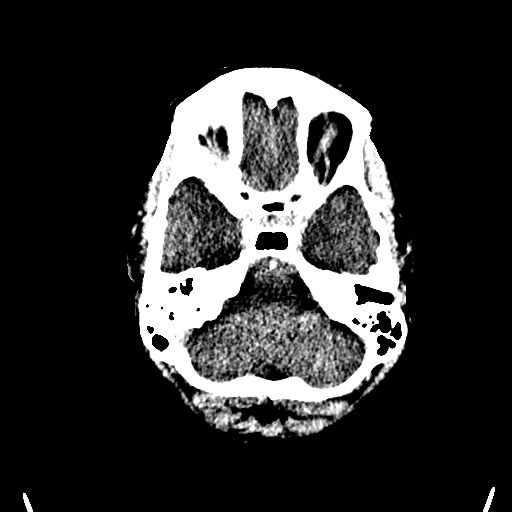
[im 15/71  bone]
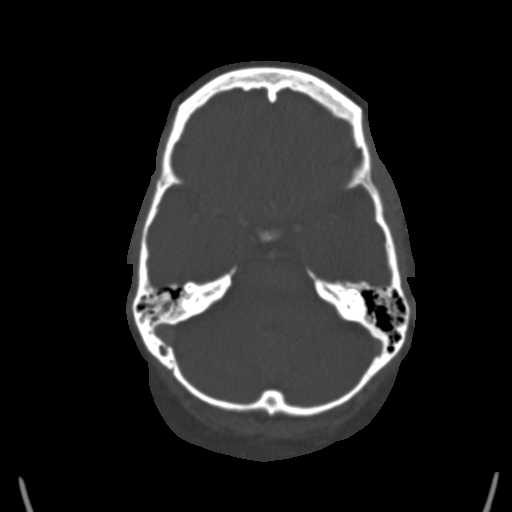
[im 18/71  brain]
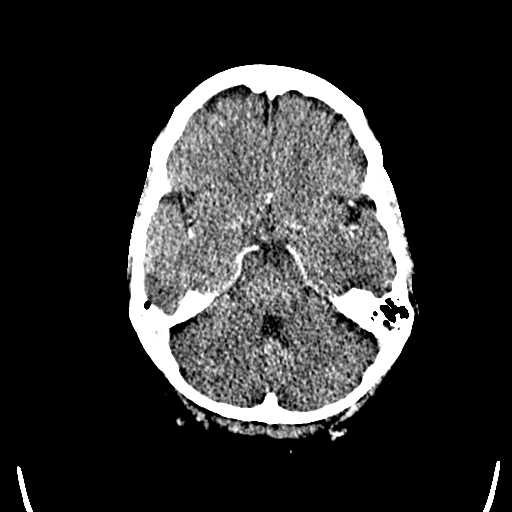
[im 22/71  bone]
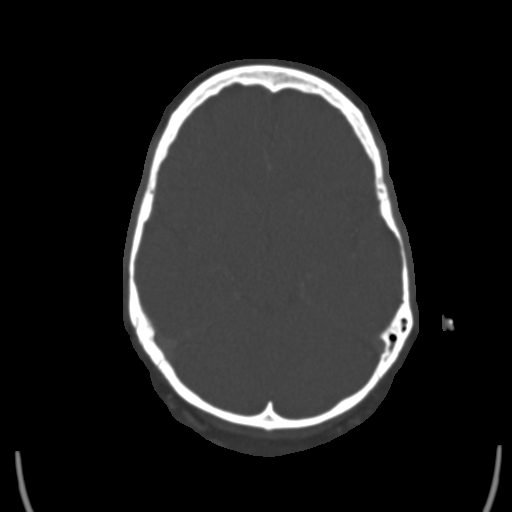
[im 25/71  brain]
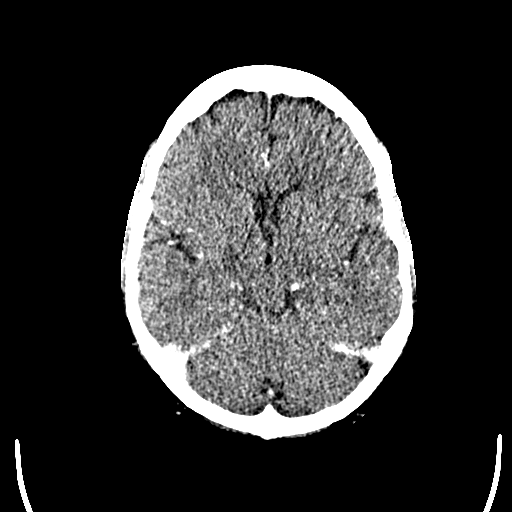
[im 29/71  bone]
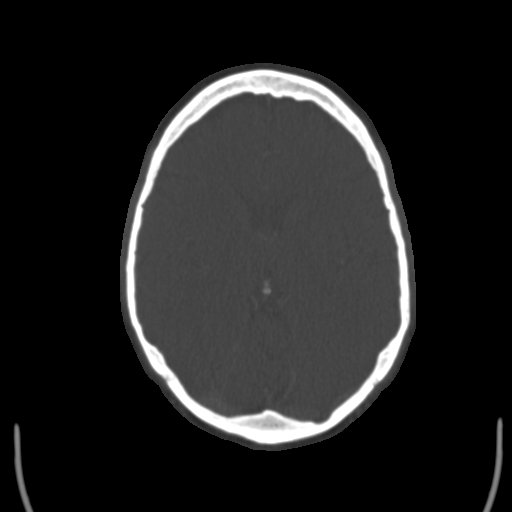
[im 32/71  brain]
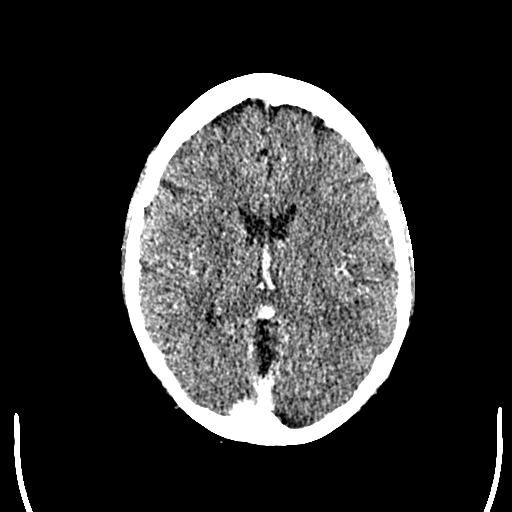
[im 36/71  bone]
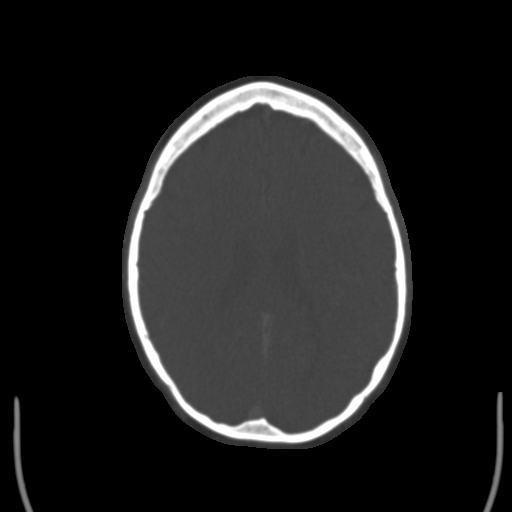
[im 39/71  brain]
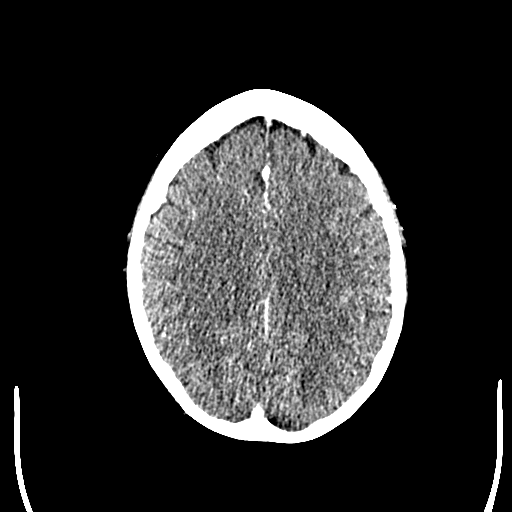
[im 43/71  bone]
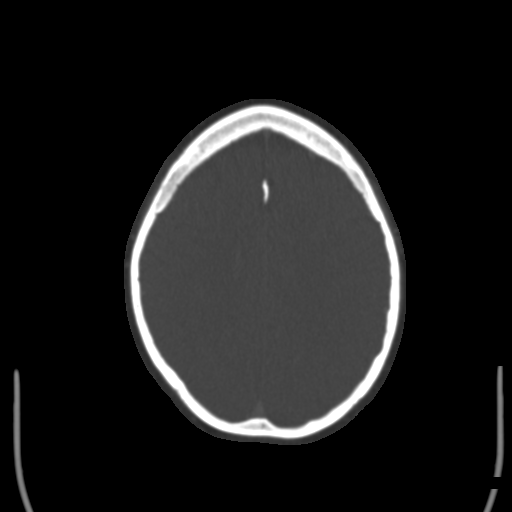
[im 46/71  brain]
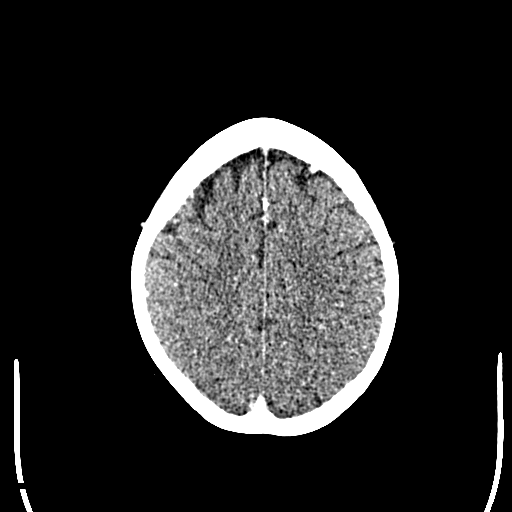
[im 50/71  bone]
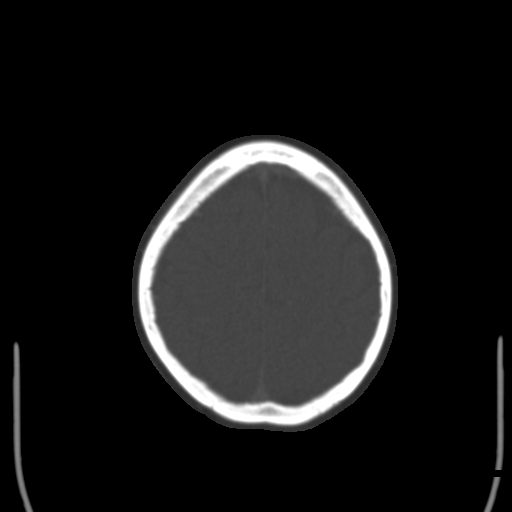
[im 53/71  brain]
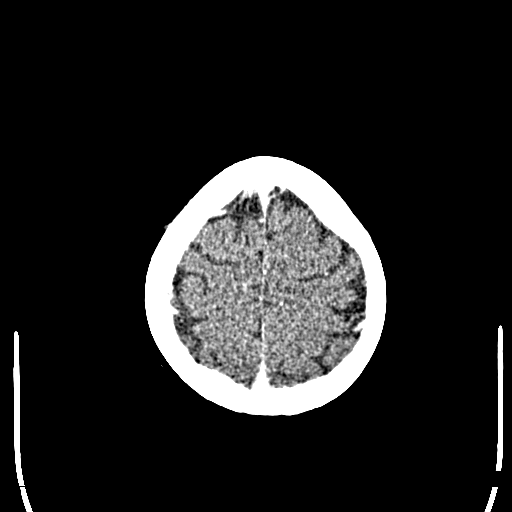
[im 57/71  bone]
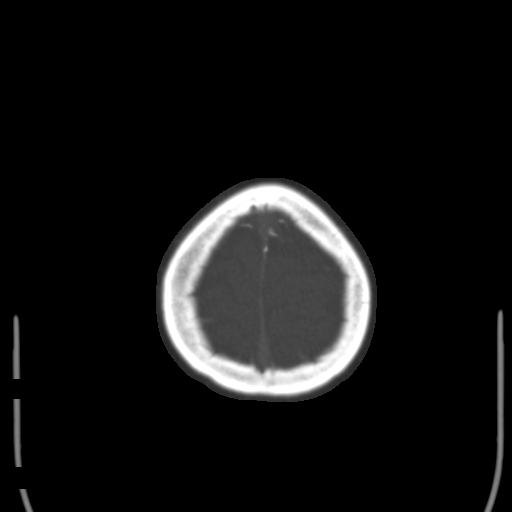
[im 60/71  brain]
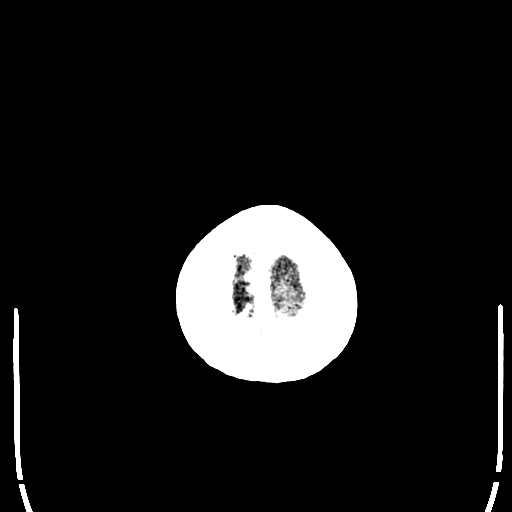
[im 64/71  bone]
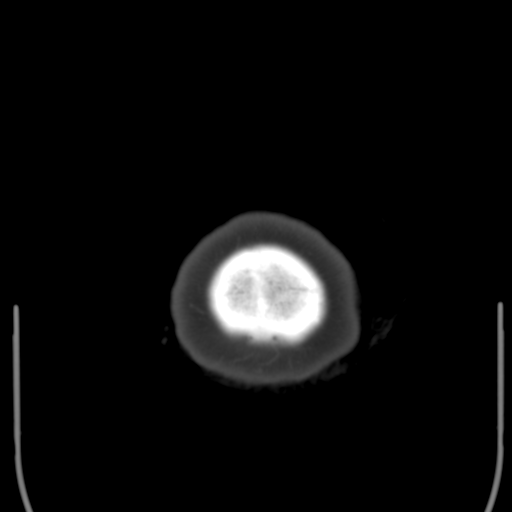
[im 67/71  brain]
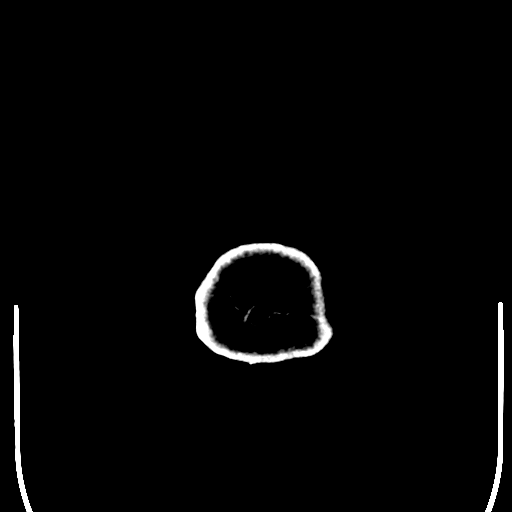

[19 of 47 positions shown; findings below may reference images not displayed]

FINDINGS: CT HEAD

Brain: There is no acute intracranial hemorrhage, mass effect, or
edema. Gray-white differentiation is preserved. Unchanged prominent
perivascular space or chronic small vessel infarct of the right
lentiform nucleus. There is no extra-axial fluid collection.
Ventricles and sulci are within normal limits in size and
configuration.

Vascular: Better evaluated on CTA portion.

Skull: Calvarium is unremarkable.

Sinuses/Orbits: No acute finding.

Other: None.

CTA HEAD

Anterior circulation: Intracranial internal carotid arteries are
patent. Anterior and middle cerebral arteries are patent.

Posterior circulation: Intracranial vertebral, basilar, and
posterior cerebral arteries are patent. There is a right posterior
communicating artery.

CT VENOGRAM

Superior sagittal sinus, straight sinus, vein of [place], internal
cerebral veins, and transverse and sigmoid sinuses are patent. No
evidence of venous thrombus.
IMPRESSION: No acute intracranial abnormality.

Unremarkable vascular imaging of the head.
